# Patient Record
Sex: Female | Born: 1940 | Race: White | Hispanic: No | Smoking: Never smoker
Health system: Southern US, Community
[De-identification: ages and names within clinical notes are randomized; demographics above are authoritative.]

## PROBLEM LIST (undated history)

## (undated) DIAGNOSIS — R7303 Prediabetes: Secondary | ICD-10-CM

## (undated) DIAGNOSIS — E7800 Pure hypercholesterolemia, unspecified: Secondary | ICD-10-CM

## (undated) DIAGNOSIS — T884XXA Failed or difficult intubation, initial encounter: Secondary | ICD-10-CM

## (undated) DIAGNOSIS — I10 Essential (primary) hypertension: Secondary | ICD-10-CM

## (undated) DIAGNOSIS — T4145XA Adverse effect of unspecified anesthetic, initial encounter: Secondary | ICD-10-CM

## (undated) DIAGNOSIS — K5792 Diverticulitis of intestine, part unspecified, without perforation or abscess without bleeding: Secondary | ICD-10-CM

## (undated) DIAGNOSIS — G3184 Mild cognitive impairment, so stated: Secondary | ICD-10-CM

## (undated) DIAGNOSIS — G629 Polyneuropathy, unspecified: Secondary | ICD-10-CM

## (undated) DIAGNOSIS — K219 Gastro-esophageal reflux disease without esophagitis: Secondary | ICD-10-CM

## (undated) DIAGNOSIS — M1990 Unspecified osteoarthritis, unspecified site: Secondary | ICD-10-CM

## (undated) DIAGNOSIS — G2581 Restless legs syndrome: Secondary | ICD-10-CM

## (undated) DIAGNOSIS — T8859XA Other complications of anesthesia, initial encounter: Secondary | ICD-10-CM

## (undated) HISTORY — PX: OTHER SURGICAL HISTORY: SHX169

## (undated) HISTORY — DX: Pure hypercholesterolemia, unspecified: E78.00

## (undated) HISTORY — PX: BACK SURGERY: SHX140

## (undated) HISTORY — PX: TONSILLECTOMY: SUR1361

## (undated) HISTORY — PX: EYE SURGERY: SHX253

## (undated) HISTORY — PX: BREAST BIOPSY: SHX20

## (undated) HISTORY — PX: ABDOMINAL HYSTERECTOMY: SHX81

---
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
## 2002-08-29 HISTORY — PX: BACK SURGERY: SHX140

## 2013-09-30 DIAGNOSIS — Z1231 Encounter for screening mammogram for malignant neoplasm of breast: Secondary | ICD-10-CM | POA: Diagnosis not present

## 2013-11-11 DIAGNOSIS — G2581 Restless legs syndrome: Secondary | ICD-10-CM | POA: Diagnosis not present

## 2013-11-11 DIAGNOSIS — G589 Mononeuropathy, unspecified: Secondary | ICD-10-CM | POA: Diagnosis not present

## 2013-11-11 DIAGNOSIS — Z23 Encounter for immunization: Secondary | ICD-10-CM | POA: Diagnosis not present

## 2013-11-11 DIAGNOSIS — G603 Idiopathic progressive neuropathy: Secondary | ICD-10-CM | POA: Diagnosis not present

## 2013-11-11 DIAGNOSIS — R413 Other amnesia: Secondary | ICD-10-CM | POA: Diagnosis not present

## 2013-11-22 DIAGNOSIS — H01009 Unspecified blepharitis unspecified eye, unspecified eyelid: Secondary | ICD-10-CM | POA: Diagnosis not present

## 2013-11-22 DIAGNOSIS — H04129 Dry eye syndrome of unspecified lacrimal gland: Secondary | ICD-10-CM | POA: Diagnosis not present

## 2013-11-22 DIAGNOSIS — H2510 Age-related nuclear cataract, unspecified eye: Secondary | ICD-10-CM | POA: Diagnosis not present

## 2013-11-22 DIAGNOSIS — H2640 Unspecified secondary cataract: Secondary | ICD-10-CM | POA: Diagnosis not present

## 2013-12-04 DIAGNOSIS — N390 Urinary tract infection, site not specified: Secondary | ICD-10-CM | POA: Diagnosis not present

## 2013-12-11 DIAGNOSIS — H26499 Other secondary cataract, unspecified eye: Secondary | ICD-10-CM | POA: Diagnosis not present

## 2013-12-11 DIAGNOSIS — H2640 Unspecified secondary cataract: Secondary | ICD-10-CM | POA: Diagnosis not present

## 2013-12-18 DIAGNOSIS — H2510 Age-related nuclear cataract, unspecified eye: Secondary | ICD-10-CM | POA: Diagnosis not present

## 2013-12-18 DIAGNOSIS — H26019 Infantile and juvenile cortical, lamellar, or zonular cataract, unspecified eye: Secondary | ICD-10-CM | POA: Diagnosis not present

## 2013-12-18 DIAGNOSIS — H25019 Cortical age-related cataract, unspecified eye: Secondary | ICD-10-CM | POA: Diagnosis not present

## 2013-12-18 DIAGNOSIS — H2589 Other age-related cataract: Secondary | ICD-10-CM | POA: Diagnosis not present

## 2014-03-19 DIAGNOSIS — G603 Idiopathic progressive neuropathy: Secondary | ICD-10-CM | POA: Diagnosis not present

## 2014-03-19 DIAGNOSIS — R413 Other amnesia: Secondary | ICD-10-CM | POA: Diagnosis not present

## 2014-03-19 DIAGNOSIS — G589 Mononeuropathy, unspecified: Secondary | ICD-10-CM | POA: Diagnosis not present

## 2014-03-19 DIAGNOSIS — G2581 Restless legs syndrome: Secondary | ICD-10-CM | POA: Diagnosis not present

## 2014-10-02 DIAGNOSIS — Z1231 Encounter for screening mammogram for malignant neoplasm of breast: Secondary | ICD-10-CM | POA: Diagnosis not present

## 2014-10-27 DIAGNOSIS — G603 Idiopathic progressive neuropathy: Secondary | ICD-10-CM | POA: Diagnosis not present

## 2014-10-27 DIAGNOSIS — G2581 Restless legs syndrome: Secondary | ICD-10-CM | POA: Diagnosis not present

## 2014-10-27 DIAGNOSIS — E782 Mixed hyperlipidemia: Secondary | ICD-10-CM | POA: Diagnosis not present

## 2014-10-27 DIAGNOSIS — Z23 Encounter for immunization: Secondary | ICD-10-CM | POA: Diagnosis not present

## 2014-10-27 DIAGNOSIS — R413 Other amnesia: Secondary | ICD-10-CM | POA: Diagnosis not present

## 2014-10-27 DIAGNOSIS — G4700 Insomnia, unspecified: Secondary | ICD-10-CM | POA: Diagnosis not present

## 2014-11-19 DIAGNOSIS — M79605 Pain in left leg: Secondary | ICD-10-CM | POA: Diagnosis not present

## 2014-11-19 DIAGNOSIS — M25562 Pain in left knee: Secondary | ICD-10-CM | POA: Diagnosis not present

## 2014-11-19 DIAGNOSIS — M25569 Pain in unspecified knee: Secondary | ICD-10-CM | POA: Diagnosis not present

## 2014-12-15 DIAGNOSIS — M9904 Segmental and somatic dysfunction of sacral region: Secondary | ICD-10-CM | POA: Diagnosis not present

## 2014-12-15 DIAGNOSIS — M9903 Segmental and somatic dysfunction of lumbar region: Secondary | ICD-10-CM | POA: Diagnosis not present

## 2014-12-15 DIAGNOSIS — M5136 Other intervertebral disc degeneration, lumbar region: Secondary | ICD-10-CM | POA: Diagnosis not present

## 2014-12-15 DIAGNOSIS — M9902 Segmental and somatic dysfunction of thoracic region: Secondary | ICD-10-CM | POA: Diagnosis not present

## 2014-12-15 DIAGNOSIS — M9901 Segmental and somatic dysfunction of cervical region: Secondary | ICD-10-CM | POA: Diagnosis not present

## 2014-12-16 DIAGNOSIS — M9904 Segmental and somatic dysfunction of sacral region: Secondary | ICD-10-CM | POA: Diagnosis not present

## 2014-12-16 DIAGNOSIS — M5136 Other intervertebral disc degeneration, lumbar region: Secondary | ICD-10-CM | POA: Diagnosis not present

## 2014-12-16 DIAGNOSIS — M9903 Segmental and somatic dysfunction of lumbar region: Secondary | ICD-10-CM | POA: Diagnosis not present

## 2014-12-16 DIAGNOSIS — M9902 Segmental and somatic dysfunction of thoracic region: Secondary | ICD-10-CM | POA: Diagnosis not present

## 2014-12-16 DIAGNOSIS — M9901 Segmental and somatic dysfunction of cervical region: Secondary | ICD-10-CM | POA: Diagnosis not present

## 2014-12-26 DIAGNOSIS — M9903 Segmental and somatic dysfunction of lumbar region: Secondary | ICD-10-CM | POA: Diagnosis not present

## 2014-12-26 DIAGNOSIS — M5136 Other intervertebral disc degeneration, lumbar region: Secondary | ICD-10-CM | POA: Diagnosis not present

## 2014-12-26 DIAGNOSIS — M9902 Segmental and somatic dysfunction of thoracic region: Secondary | ICD-10-CM | POA: Diagnosis not present

## 2014-12-26 DIAGNOSIS — M9901 Segmental and somatic dysfunction of cervical region: Secondary | ICD-10-CM | POA: Diagnosis not present

## 2014-12-26 DIAGNOSIS — M9904 Segmental and somatic dysfunction of sacral region: Secondary | ICD-10-CM | POA: Diagnosis not present

## 2014-12-31 DIAGNOSIS — M9902 Segmental and somatic dysfunction of thoracic region: Secondary | ICD-10-CM | POA: Diagnosis not present

## 2014-12-31 DIAGNOSIS — M9904 Segmental and somatic dysfunction of sacral region: Secondary | ICD-10-CM | POA: Diagnosis not present

## 2014-12-31 DIAGNOSIS — M9901 Segmental and somatic dysfunction of cervical region: Secondary | ICD-10-CM | POA: Diagnosis not present

## 2014-12-31 DIAGNOSIS — M5136 Other intervertebral disc degeneration, lumbar region: Secondary | ICD-10-CM | POA: Diagnosis not present

## 2014-12-31 DIAGNOSIS — M9903 Segmental and somatic dysfunction of lumbar region: Secondary | ICD-10-CM | POA: Diagnosis not present

## 2015-01-02 DIAGNOSIS — M9904 Segmental and somatic dysfunction of sacral region: Secondary | ICD-10-CM | POA: Diagnosis not present

## 2015-01-02 DIAGNOSIS — M9902 Segmental and somatic dysfunction of thoracic region: Secondary | ICD-10-CM | POA: Diagnosis not present

## 2015-01-02 DIAGNOSIS — M9903 Segmental and somatic dysfunction of lumbar region: Secondary | ICD-10-CM | POA: Diagnosis not present

## 2015-01-02 DIAGNOSIS — M5136 Other intervertebral disc degeneration, lumbar region: Secondary | ICD-10-CM | POA: Diagnosis not present

## 2015-01-02 DIAGNOSIS — M9901 Segmental and somatic dysfunction of cervical region: Secondary | ICD-10-CM | POA: Diagnosis not present

## 2015-01-08 DIAGNOSIS — M545 Low back pain: Secondary | ICD-10-CM | POA: Diagnosis not present

## 2015-01-08 DIAGNOSIS — M25551 Pain in right hip: Secondary | ICD-10-CM | POA: Diagnosis not present

## 2015-01-14 DIAGNOSIS — G629 Polyneuropathy, unspecified: Secondary | ICD-10-CM | POA: Diagnosis not present

## 2015-01-14 DIAGNOSIS — G2581 Restless legs syndrome: Secondary | ICD-10-CM | POA: Diagnosis not present

## 2015-01-14 DIAGNOSIS — F411 Generalized anxiety disorder: Secondary | ICD-10-CM | POA: Diagnosis not present

## 2015-01-14 DIAGNOSIS — G4700 Insomnia, unspecified: Secondary | ICD-10-CM | POA: Diagnosis not present

## 2015-01-14 DIAGNOSIS — E785 Hyperlipidemia, unspecified: Secondary | ICD-10-CM | POA: Diagnosis not present

## 2015-01-16 DIAGNOSIS — M25551 Pain in right hip: Secondary | ICD-10-CM | POA: Diagnosis not present

## 2015-03-18 DIAGNOSIS — Z961 Presence of intraocular lens: Secondary | ICD-10-CM | POA: Diagnosis not present

## 2015-03-18 DIAGNOSIS — H02834 Dermatochalasis of left upper eyelid: Secondary | ICD-10-CM | POA: Diagnosis not present

## 2015-03-18 DIAGNOSIS — H04123 Dry eye syndrome of bilateral lacrimal glands: Secondary | ICD-10-CM | POA: Diagnosis not present

## 2015-03-18 DIAGNOSIS — H02831 Dermatochalasis of right upper eyelid: Secondary | ICD-10-CM | POA: Diagnosis not present

## 2015-04-06 DIAGNOSIS — H04121 Dry eye syndrome of right lacrimal gland: Secondary | ICD-10-CM | POA: Diagnosis not present

## 2015-04-20 DIAGNOSIS — G4700 Insomnia, unspecified: Secondary | ICD-10-CM | POA: Diagnosis not present

## 2015-04-20 DIAGNOSIS — G629 Polyneuropathy, unspecified: Secondary | ICD-10-CM | POA: Diagnosis not present

## 2015-04-20 DIAGNOSIS — Z1389 Encounter for screening for other disorder: Secondary | ICD-10-CM | POA: Diagnosis not present

## 2015-04-20 DIAGNOSIS — G2581 Restless legs syndrome: Secondary | ICD-10-CM | POA: Diagnosis not present

## 2015-04-20 DIAGNOSIS — F411 Generalized anxiety disorder: Secondary | ICD-10-CM | POA: Diagnosis not present

## 2015-04-20 DIAGNOSIS — E785 Hyperlipidemia, unspecified: Secondary | ICD-10-CM | POA: Diagnosis not present

## 2015-05-13 DIAGNOSIS — H04123 Dry eye syndrome of bilateral lacrimal glands: Secondary | ICD-10-CM | POA: Diagnosis not present

## 2015-05-27 DIAGNOSIS — M859 Disorder of bone density and structure, unspecified: Secondary | ICD-10-CM | POA: Diagnosis not present

## 2015-06-24 DIAGNOSIS — L908 Other atrophic disorders of skin: Secondary | ICD-10-CM | POA: Diagnosis not present

## 2015-06-24 DIAGNOSIS — H02409 Unspecified ptosis of unspecified eyelid: Secondary | ICD-10-CM | POA: Diagnosis not present

## 2015-06-24 DIAGNOSIS — H02831 Dermatochalasis of right upper eyelid: Secondary | ICD-10-CM | POA: Diagnosis not present

## 2015-06-24 DIAGNOSIS — H02834 Dermatochalasis of left upper eyelid: Secondary | ICD-10-CM | POA: Diagnosis not present

## 2015-06-26 DIAGNOSIS — M25551 Pain in right hip: Secondary | ICD-10-CM | POA: Diagnosis not present

## 2015-06-30 DIAGNOSIS — M545 Low back pain: Secondary | ICD-10-CM | POA: Diagnosis not present

## 2015-06-30 DIAGNOSIS — M25551 Pain in right hip: Secondary | ICD-10-CM | POA: Diagnosis not present

## 2015-07-02 ENCOUNTER — Other Ambulatory Visit: Payer: Self-pay | Admitting: Orthopedic Surgery

## 2015-07-02 DIAGNOSIS — M545 Low back pain, unspecified: Secondary | ICD-10-CM

## 2015-07-17 DIAGNOSIS — M8588 Other specified disorders of bone density and structure, other site: Secondary | ICD-10-CM | POA: Diagnosis not present

## 2015-07-27 DIAGNOSIS — H02834 Dermatochalasis of left upper eyelid: Secondary | ICD-10-CM | POA: Diagnosis not present

## 2015-07-27 DIAGNOSIS — H04123 Dry eye syndrome of bilateral lacrimal glands: Secondary | ICD-10-CM | POA: Diagnosis not present

## 2015-07-27 DIAGNOSIS — H02831 Dermatochalasis of right upper eyelid: Secondary | ICD-10-CM | POA: Diagnosis not present

## 2015-07-27 DIAGNOSIS — Z961 Presence of intraocular lens: Secondary | ICD-10-CM | POA: Diagnosis not present

## 2015-07-30 ENCOUNTER — Other Ambulatory Visit: Payer: Self-pay

## 2015-08-03 DIAGNOSIS — G2581 Restless legs syndrome: Secondary | ICD-10-CM | POA: Diagnosis not present

## 2015-08-03 DIAGNOSIS — G4700 Insomnia, unspecified: Secondary | ICD-10-CM | POA: Diagnosis not present

## 2015-08-03 DIAGNOSIS — M810 Age-related osteoporosis without current pathological fracture: Secondary | ICD-10-CM | POA: Diagnosis not present

## 2015-08-03 DIAGNOSIS — G629 Polyneuropathy, unspecified: Secondary | ICD-10-CM | POA: Diagnosis not present

## 2015-08-03 DIAGNOSIS — F411 Generalized anxiety disorder: Secondary | ICD-10-CM | POA: Diagnosis not present

## 2015-08-03 DIAGNOSIS — E785 Hyperlipidemia, unspecified: Secondary | ICD-10-CM | POA: Diagnosis not present

## 2015-08-10 DIAGNOSIS — Z79899 Other long term (current) drug therapy: Secondary | ICD-10-CM | POA: Diagnosis not present

## 2015-08-14 DIAGNOSIS — M25551 Pain in right hip: Secondary | ICD-10-CM | POA: Diagnosis not present

## 2015-08-14 DIAGNOSIS — S3282XA Multiple fractures of pelvis without disruption of pelvic ring, initial encounter for closed fracture: Secondary | ICD-10-CM | POA: Diagnosis not present

## 2015-09-04 ENCOUNTER — Encounter (HOSPITAL_COMMUNITY): Payer: Self-pay

## 2015-09-04 ENCOUNTER — Ambulatory Visit (HOSPITAL_COMMUNITY)
Admission: RE | Admit: 2015-09-04 | Discharge: 2015-09-04 | Disposition: A | Discharge status: Home / Self Care | Payer: Medicare Other | Source: Ambulatory Visit | Attending: Family Medicine | Admitting: Family Medicine

## 2015-09-04 ENCOUNTER — Other Ambulatory Visit (HOSPITAL_COMMUNITY): Payer: Self-pay | Admitting: Family Medicine

## 2015-09-04 DIAGNOSIS — M810 Age-related osteoporosis without current pathological fracture: Secondary | ICD-10-CM | POA: Diagnosis not present

## 2015-09-04 HISTORY — DX: Polyneuropathy, unspecified: G62.9

## 2015-09-04 HISTORY — DX: Restless legs syndrome: G25.81

## 2015-09-04 MED ORDER — ZOLEDRONIC ACID 5 MG/100ML IV SOLN
5.0000 mg | Freq: Once | INTRAVENOUS | Status: AC
  Administered 2015-09-04: 5 mg via INTRAVENOUS
  Filled 2015-09-04: qty 100

## 2015-09-04 MED ORDER — SODIUM CHLORIDE 0.9 % IV SOLN
Freq: Once | INTRAVENOUS | Status: AC
  Administered 2015-09-04: 14:00:00 via INTRAVENOUS

## 2015-09-04 NOTE — Discharge Instructions (Signed)

## 2015-09-17 DIAGNOSIS — E785 Hyperlipidemia, unspecified: Secondary | ICD-10-CM | POA: Diagnosis not present

## 2015-09-17 DIAGNOSIS — G629 Polyneuropathy, unspecified: Secondary | ICD-10-CM | POA: Diagnosis not present

## 2015-09-17 DIAGNOSIS — H02831 Dermatochalasis of right upper eyelid: Secondary | ICD-10-CM | POA: Diagnosis not present

## 2015-09-17 DIAGNOSIS — H02403 Unspecified ptosis of bilateral eyelids: Secondary | ICD-10-CM | POA: Diagnosis not present

## 2015-09-17 DIAGNOSIS — E7800 Pure hypercholesterolemia, unspecified: Secondary | ICD-10-CM | POA: Diagnosis not present

## 2015-09-17 DIAGNOSIS — F419 Anxiety disorder, unspecified: Secondary | ICD-10-CM | POA: Diagnosis not present

## 2015-09-17 DIAGNOSIS — H02834 Dermatochalasis of left upper eyelid: Secondary | ICD-10-CM | POA: Diagnosis not present

## 2015-09-29 DIAGNOSIS — Z880 Allergy status to penicillin: Secondary | ICD-10-CM | POA: Diagnosis not present

## 2015-09-29 DIAGNOSIS — Z48810 Encounter for surgical aftercare following surgery on the sense organs: Secondary | ICD-10-CM | POA: Diagnosis not present

## 2015-09-29 DIAGNOSIS — Z881 Allergy status to other antibiotic agents status: Secondary | ICD-10-CM | POA: Diagnosis not present

## 2015-09-29 DIAGNOSIS — Z882 Allergy status to sulfonamides status: Secondary | ICD-10-CM | POA: Diagnosis not present

## 2015-09-29 DIAGNOSIS — H02834 Dermatochalasis of left upper eyelid: Secondary | ICD-10-CM | POA: Diagnosis not present

## 2015-09-29 DIAGNOSIS — H02831 Dermatochalasis of right upper eyelid: Secondary | ICD-10-CM | POA: Diagnosis not present

## 2015-09-29 DIAGNOSIS — Z885 Allergy status to narcotic agent status: Secondary | ICD-10-CM | POA: Diagnosis not present

## 2015-09-30 ENCOUNTER — Other Ambulatory Visit: Payer: Self-pay

## 2015-09-30 DIAGNOSIS — Z1231 Encounter for screening mammogram for malignant neoplasm of breast: Secondary | ICD-10-CM

## 2015-10-19 ENCOUNTER — Ambulatory Visit
Admission: RE | Admit: 2015-10-19 | Discharge: 2015-10-19 | Disposition: A | Discharge status: Home / Self Care | Payer: Medicare Other | Source: Ambulatory Visit

## 2015-10-19 DIAGNOSIS — Z1231 Encounter for screening mammogram for malignant neoplasm of breast: Secondary | ICD-10-CM

## 2015-11-25 DIAGNOSIS — Z961 Presence of intraocular lens: Secondary | ICD-10-CM | POA: Diagnosis not present

## 2015-11-25 DIAGNOSIS — H04123 Dry eye syndrome of bilateral lacrimal glands: Secondary | ICD-10-CM | POA: Diagnosis not present

## 2015-11-30 DIAGNOSIS — E785 Hyperlipidemia, unspecified: Secondary | ICD-10-CM | POA: Diagnosis not present

## 2015-11-30 DIAGNOSIS — G629 Polyneuropathy, unspecified: Secondary | ICD-10-CM | POA: Diagnosis not present

## 2015-11-30 DIAGNOSIS — G2581 Restless legs syndrome: Secondary | ICD-10-CM | POA: Diagnosis not present

## 2015-11-30 DIAGNOSIS — F411 Generalized anxiety disorder: Secondary | ICD-10-CM | POA: Diagnosis not present

## 2015-11-30 DIAGNOSIS — M810 Age-related osteoporosis without current pathological fracture: Secondary | ICD-10-CM | POA: Diagnosis not present

## 2015-11-30 DIAGNOSIS — R635 Abnormal weight gain: Secondary | ICD-10-CM | POA: Diagnosis not present

## 2015-11-30 DIAGNOSIS — G4700 Insomnia, unspecified: Secondary | ICD-10-CM | POA: Diagnosis not present

## 2015-12-09 DIAGNOSIS — S2231XA Fracture of one rib, right side, initial encounter for closed fracture: Secondary | ICD-10-CM | POA: Diagnosis not present

## 2015-12-09 DIAGNOSIS — S20211A Contusion of right front wall of thorax, initial encounter: Secondary | ICD-10-CM | POA: Diagnosis not present

## 2015-12-30 DIAGNOSIS — T07 Unspecified multiple injuries: Secondary | ICD-10-CM | POA: Diagnosis not present

## 2015-12-30 DIAGNOSIS — L259 Unspecified contact dermatitis, unspecified cause: Secondary | ICD-10-CM | POA: Diagnosis not present

## 2016-02-18 DIAGNOSIS — G629 Polyneuropathy, unspecified: Secondary | ICD-10-CM | POA: Diagnosis not present

## 2016-02-18 DIAGNOSIS — L259 Unspecified contact dermatitis, unspecified cause: Secondary | ICD-10-CM | POA: Diagnosis not present

## 2016-02-21 DIAGNOSIS — L309 Dermatitis, unspecified: Secondary | ICD-10-CM | POA: Diagnosis not present

## 2016-04-02 DIAGNOSIS — M79672 Pain in left foot: Secondary | ICD-10-CM | POA: Diagnosis not present

## 2016-04-02 DIAGNOSIS — W548XXA Other contact with dog, initial encounter: Secondary | ICD-10-CM | POA: Diagnosis not present

## 2016-04-02 DIAGNOSIS — S51801A Unspecified open wound of right forearm, initial encounter: Secondary | ICD-10-CM | POA: Diagnosis not present

## 2016-04-13 DIAGNOSIS — M79671 Pain in right foot: Secondary | ICD-10-CM | POA: Diagnosis not present

## 2016-04-26 DIAGNOSIS — S92322D Displaced fracture of second metatarsal bone, left foot, subsequent encounter for fracture with routine healing: Secondary | ICD-10-CM | POA: Diagnosis not present

## 2016-05-07 DIAGNOSIS — L259 Unspecified contact dermatitis, unspecified cause: Secondary | ICD-10-CM | POA: Diagnosis not present

## 2016-05-15 DIAGNOSIS — L509 Urticaria, unspecified: Secondary | ICD-10-CM | POA: Diagnosis not present

## 2016-05-19 DIAGNOSIS — L308 Other specified dermatitis: Secondary | ICD-10-CM | POA: Diagnosis not present

## 2016-05-31 DIAGNOSIS — M79672 Pain in left foot: Secondary | ICD-10-CM | POA: Diagnosis not present

## 2016-05-31 DIAGNOSIS — G629 Polyneuropathy, unspecified: Secondary | ICD-10-CM | POA: Diagnosis not present

## 2016-05-31 DIAGNOSIS — R635 Abnormal weight gain: Secondary | ICD-10-CM | POA: Diagnosis not present

## 2016-05-31 DIAGNOSIS — Z79899 Other long term (current) drug therapy: Secondary | ICD-10-CM | POA: Diagnosis not present

## 2016-05-31 DIAGNOSIS — F411 Generalized anxiety disorder: Secondary | ICD-10-CM | POA: Diagnosis not present

## 2016-05-31 DIAGNOSIS — G2581 Restless legs syndrome: Secondary | ICD-10-CM | POA: Diagnosis not present

## 2016-05-31 DIAGNOSIS — E785 Hyperlipidemia, unspecified: Secondary | ICD-10-CM | POA: Diagnosis not present

## 2016-05-31 DIAGNOSIS — G4700 Insomnia, unspecified: Secondary | ICD-10-CM | POA: Diagnosis not present

## 2016-06-02 DIAGNOSIS — R600 Localized edema: Secondary | ICD-10-CM | POA: Diagnosis not present

## 2016-06-02 DIAGNOSIS — S92322S Displaced fracture of second metatarsal bone, left foot, sequela: Secondary | ICD-10-CM | POA: Diagnosis not present

## 2016-06-02 DIAGNOSIS — M79672 Pain in left foot: Secondary | ICD-10-CM | POA: Diagnosis not present

## 2016-06-03 DIAGNOSIS — F411 Generalized anxiety disorder: Secondary | ICD-10-CM | POA: Diagnosis not present

## 2016-06-03 DIAGNOSIS — Z79899 Other long term (current) drug therapy: Secondary | ICD-10-CM | POA: Diagnosis not present

## 2016-06-03 DIAGNOSIS — E785 Hyperlipidemia, unspecified: Secondary | ICD-10-CM | POA: Diagnosis not present

## 2016-06-03 DIAGNOSIS — R635 Abnormal weight gain: Secondary | ICD-10-CM | POA: Diagnosis not present

## 2016-06-03 DIAGNOSIS — G629 Polyneuropathy, unspecified: Secondary | ICD-10-CM | POA: Diagnosis not present

## 2016-06-03 DIAGNOSIS — G4700 Insomnia, unspecified: Secondary | ICD-10-CM | POA: Diagnosis not present

## 2016-06-03 DIAGNOSIS — G2581 Restless legs syndrome: Secondary | ICD-10-CM | POA: Diagnosis not present

## 2016-06-03 DIAGNOSIS — M79672 Pain in left foot: Secondary | ICD-10-CM | POA: Diagnosis not present

## 2016-06-30 DIAGNOSIS — S92322G Displaced fracture of second metatarsal bone, left foot, subsequent encounter for fracture with delayed healing: Secondary | ICD-10-CM | POA: Diagnosis not present

## 2016-07-14 DIAGNOSIS — S92322G Displaced fracture of second metatarsal bone, left foot, subsequent encounter for fracture with delayed healing: Secondary | ICD-10-CM | POA: Diagnosis not present

## 2016-08-04 DIAGNOSIS — S92322G Displaced fracture of second metatarsal bone, left foot, subsequent encounter for fracture with delayed healing: Secondary | ICD-10-CM | POA: Diagnosis not present

## 2016-08-04 DIAGNOSIS — G629 Polyneuropathy, unspecified: Secondary | ICD-10-CM | POA: Diagnosis not present

## 2016-09-20 ENCOUNTER — Other Ambulatory Visit: Payer: Self-pay | Admitting: Family Medicine

## 2016-09-20 DIAGNOSIS — Z1231 Encounter for screening mammogram for malignant neoplasm of breast: Secondary | ICD-10-CM

## 2016-09-29 DIAGNOSIS — G629 Polyneuropathy, unspecified: Secondary | ICD-10-CM | POA: Diagnosis not present

## 2016-09-29 DIAGNOSIS — M810 Age-related osteoporosis without current pathological fracture: Secondary | ICD-10-CM | POA: Diagnosis not present

## 2016-09-29 DIAGNOSIS — G2581 Restless legs syndrome: Secondary | ICD-10-CM | POA: Diagnosis not present

## 2016-09-29 DIAGNOSIS — E7800 Pure hypercholesterolemia, unspecified: Secondary | ICD-10-CM | POA: Diagnosis not present

## 2016-10-06 DIAGNOSIS — M06841 Other specified rheumatoid arthritis, right hand: Secondary | ICD-10-CM | POA: Diagnosis not present

## 2016-10-06 DIAGNOSIS — M1990 Unspecified osteoarthritis, unspecified site: Secondary | ICD-10-CM | POA: Diagnosis not present

## 2016-10-06 DIAGNOSIS — M06842 Other specified rheumatoid arthritis, left hand: Secondary | ICD-10-CM | POA: Diagnosis not present

## 2016-10-06 DIAGNOSIS — M79643 Pain in unspecified hand: Secondary | ICD-10-CM | POA: Diagnosis not present

## 2016-10-06 DIAGNOSIS — M25511 Pain in right shoulder: Secondary | ICD-10-CM | POA: Diagnosis not present

## 2016-10-06 DIAGNOSIS — M2550 Pain in unspecified joint: Secondary | ICD-10-CM | POA: Diagnosis not present

## 2016-10-19 ENCOUNTER — Other Ambulatory Visit: Payer: Self-pay | Admitting: Internal Medicine

## 2016-10-19 ENCOUNTER — Ambulatory Visit
Admission: RE | Admit: 2016-10-19 | Discharge: 2016-10-19 | Disposition: A | Discharge status: Home / Self Care | Payer: Medicare Other | Source: Ambulatory Visit | Attending: Family Medicine | Admitting: Family Medicine

## 2016-10-19 DIAGNOSIS — Z1231 Encounter for screening mammogram for malignant neoplasm of breast: Secondary | ICD-10-CM | POA: Diagnosis not present

## 2016-10-27 DIAGNOSIS — G629 Polyneuropathy, unspecified: Secondary | ICD-10-CM | POA: Diagnosis not present

## 2016-10-27 DIAGNOSIS — R350 Frequency of micturition: Secondary | ICD-10-CM | POA: Diagnosis not present

## 2016-10-27 DIAGNOSIS — R413 Other amnesia: Secondary | ICD-10-CM | POA: Diagnosis not present

## 2016-10-27 DIAGNOSIS — E559 Vitamin D deficiency, unspecified: Secondary | ICD-10-CM | POA: Diagnosis not present

## 2016-10-27 DIAGNOSIS — E7800 Pure hypercholesterolemia, unspecified: Secondary | ICD-10-CM | POA: Diagnosis not present

## 2016-11-01 DIAGNOSIS — M79672 Pain in left foot: Secondary | ICD-10-CM | POA: Diagnosis not present

## 2016-11-01 DIAGNOSIS — E559 Vitamin D deficiency, unspecified: Secondary | ICD-10-CM | POA: Diagnosis not present

## 2016-11-01 DIAGNOSIS — M810 Age-related osteoporosis without current pathological fracture: Secondary | ICD-10-CM | POA: Diagnosis not present

## 2016-11-01 DIAGNOSIS — E7800 Pure hypercholesterolemia, unspecified: Secondary | ICD-10-CM | POA: Diagnosis not present

## 2016-11-03 DIAGNOSIS — L03031 Cellulitis of right toe: Secondary | ICD-10-CM | POA: Diagnosis not present

## 2016-11-03 DIAGNOSIS — Z09 Encounter for follow-up examination after completed treatment for conditions other than malignant neoplasm: Secondary | ICD-10-CM | POA: Diagnosis not present

## 2016-11-03 DIAGNOSIS — L02611 Cutaneous abscess of right foot: Secondary | ICD-10-CM | POA: Diagnosis not present

## 2016-11-03 DIAGNOSIS — M79672 Pain in left foot: Secondary | ICD-10-CM | POA: Diagnosis not present

## 2016-11-03 DIAGNOSIS — M79674 Pain in right toe(s): Secondary | ICD-10-CM | POA: Diagnosis not present

## 2016-11-30 DIAGNOSIS — H04123 Dry eye syndrome of bilateral lacrimal glands: Secondary | ICD-10-CM | POA: Diagnosis not present

## 2016-11-30 DIAGNOSIS — Z9889 Other specified postprocedural states: Secondary | ICD-10-CM | POA: Diagnosis not present

## 2016-11-30 DIAGNOSIS — Z961 Presence of intraocular lens: Secondary | ICD-10-CM | POA: Diagnosis not present

## 2017-01-04 DIAGNOSIS — M810 Age-related osteoporosis without current pathological fracture: Secondary | ICD-10-CM | POA: Diagnosis not present

## 2017-01-05 DIAGNOSIS — Z780 Asymptomatic menopausal state: Secondary | ICD-10-CM | POA: Diagnosis not present

## 2017-01-05 DIAGNOSIS — G8929 Other chronic pain: Secondary | ICD-10-CM | POA: Diagnosis not present

## 2017-01-05 DIAGNOSIS — Z0000 Encounter for general adult medical examination without abnormal findings: Secondary | ICD-10-CM | POA: Diagnosis not present

## 2017-01-05 DIAGNOSIS — M25512 Pain in left shoulder: Secondary | ICD-10-CM | POA: Diagnosis not present

## 2017-01-09 DIAGNOSIS — M79643 Pain in unspecified hand: Secondary | ICD-10-CM | POA: Diagnosis not present

## 2017-01-09 DIAGNOSIS — M25512 Pain in left shoulder: Secondary | ICD-10-CM | POA: Diagnosis not present

## 2017-01-09 DIAGNOSIS — M1990 Unspecified osteoarthritis, unspecified site: Secondary | ICD-10-CM | POA: Diagnosis not present

## 2017-01-27 DIAGNOSIS — L239 Allergic contact dermatitis, unspecified cause: Secondary | ICD-10-CM | POA: Diagnosis not present

## 2017-02-02 DIAGNOSIS — R21 Rash and other nonspecific skin eruption: Secondary | ICD-10-CM | POA: Diagnosis not present

## 2017-02-02 DIAGNOSIS — L299 Pruritus, unspecified: Secondary | ICD-10-CM | POA: Diagnosis not present

## 2017-02-06 DIAGNOSIS — M25511 Pain in right shoulder: Secondary | ICD-10-CM | POA: Diagnosis not present

## 2017-02-06 DIAGNOSIS — R9431 Abnormal electrocardiogram [ECG] [EKG]: Secondary | ICD-10-CM | POA: Diagnosis not present

## 2017-02-06 DIAGNOSIS — E7800 Pure hypercholesterolemia, unspecified: Secondary | ICD-10-CM | POA: Diagnosis not present

## 2017-02-06 DIAGNOSIS — M25512 Pain in left shoulder: Secondary | ICD-10-CM | POA: Diagnosis not present

## 2017-02-10 DIAGNOSIS — M25519 Pain in unspecified shoulder: Secondary | ICD-10-CM | POA: Diagnosis not present

## 2017-02-10 DIAGNOSIS — R9431 Abnormal electrocardiogram [ECG] [EKG]: Secondary | ICD-10-CM | POA: Diagnosis not present

## 2017-02-13 DIAGNOSIS — R0789 Other chest pain: Secondary | ICD-10-CM | POA: Diagnosis not present

## 2017-02-13 DIAGNOSIS — S299XXA Unspecified injury of thorax, initial encounter: Secondary | ICD-10-CM | POA: Diagnosis not present

## 2017-02-13 DIAGNOSIS — R0781 Pleurodynia: Secondary | ICD-10-CM | POA: Diagnosis not present

## 2017-03-24 DIAGNOSIS — N390 Urinary tract infection, site not specified: Secondary | ICD-10-CM | POA: Diagnosis not present

## 2017-04-26 DIAGNOSIS — M9901 Segmental and somatic dysfunction of cervical region: Secondary | ICD-10-CM | POA: Diagnosis not present

## 2017-04-26 DIAGNOSIS — M9904 Segmental and somatic dysfunction of sacral region: Secondary | ICD-10-CM | POA: Diagnosis not present

## 2017-04-26 DIAGNOSIS — M5030 Other cervical disc degeneration, unspecified cervical region: Secondary | ICD-10-CM | POA: Diagnosis not present

## 2017-04-26 DIAGNOSIS — M9902 Segmental and somatic dysfunction of thoracic region: Secondary | ICD-10-CM | POA: Diagnosis not present

## 2017-04-26 DIAGNOSIS — M9903 Segmental and somatic dysfunction of lumbar region: Secondary | ICD-10-CM | POA: Diagnosis not present

## 2017-04-26 DIAGNOSIS — M5136 Other intervertebral disc degeneration, lumbar region: Secondary | ICD-10-CM | POA: Diagnosis not present

## 2017-04-28 DIAGNOSIS — M9902 Segmental and somatic dysfunction of thoracic region: Secondary | ICD-10-CM | POA: Diagnosis not present

## 2017-04-28 DIAGNOSIS — M5136 Other intervertebral disc degeneration, lumbar region: Secondary | ICD-10-CM | POA: Diagnosis not present

## 2017-04-28 DIAGNOSIS — M5030 Other cervical disc degeneration, unspecified cervical region: Secondary | ICD-10-CM | POA: Diagnosis not present

## 2017-04-28 DIAGNOSIS — M9903 Segmental and somatic dysfunction of lumbar region: Secondary | ICD-10-CM | POA: Diagnosis not present

## 2017-04-28 DIAGNOSIS — M9901 Segmental and somatic dysfunction of cervical region: Secondary | ICD-10-CM | POA: Diagnosis not present

## 2017-04-28 DIAGNOSIS — M9904 Segmental and somatic dysfunction of sacral region: Secondary | ICD-10-CM | POA: Diagnosis not present

## 2017-05-04 DIAGNOSIS — E7800 Pure hypercholesterolemia, unspecified: Secondary | ICD-10-CM | POA: Diagnosis not present

## 2017-05-04 DIAGNOSIS — M810 Age-related osteoporosis without current pathological fracture: Secondary | ICD-10-CM | POA: Diagnosis not present

## 2017-05-05 DIAGNOSIS — M9904 Segmental and somatic dysfunction of sacral region: Secondary | ICD-10-CM | POA: Diagnosis not present

## 2017-05-05 DIAGNOSIS — M5136 Other intervertebral disc degeneration, lumbar region: Secondary | ICD-10-CM | POA: Diagnosis not present

## 2017-05-05 DIAGNOSIS — M9902 Segmental and somatic dysfunction of thoracic region: Secondary | ICD-10-CM | POA: Diagnosis not present

## 2017-05-05 DIAGNOSIS — M5030 Other cervical disc degeneration, unspecified cervical region: Secondary | ICD-10-CM | POA: Diagnosis not present

## 2017-05-05 DIAGNOSIS — M9903 Segmental and somatic dysfunction of lumbar region: Secondary | ICD-10-CM | POA: Diagnosis not present

## 2017-05-05 DIAGNOSIS — M9901 Segmental and somatic dysfunction of cervical region: Secondary | ICD-10-CM | POA: Diagnosis not present

## 2017-05-10 DIAGNOSIS — M9902 Segmental and somatic dysfunction of thoracic region: Secondary | ICD-10-CM | POA: Diagnosis not present

## 2017-05-10 DIAGNOSIS — M9903 Segmental and somatic dysfunction of lumbar region: Secondary | ICD-10-CM | POA: Diagnosis not present

## 2017-05-10 DIAGNOSIS — M9904 Segmental and somatic dysfunction of sacral region: Secondary | ICD-10-CM | POA: Diagnosis not present

## 2017-05-10 DIAGNOSIS — M5136 Other intervertebral disc degeneration, lumbar region: Secondary | ICD-10-CM | POA: Diagnosis not present

## 2017-05-10 DIAGNOSIS — M5030 Other cervical disc degeneration, unspecified cervical region: Secondary | ICD-10-CM | POA: Diagnosis not present

## 2017-05-10 DIAGNOSIS — M9901 Segmental and somatic dysfunction of cervical region: Secondary | ICD-10-CM | POA: Diagnosis not present

## 2017-05-11 DIAGNOSIS — Z780 Asymptomatic menopausal state: Secondary | ICD-10-CM | POA: Diagnosis not present

## 2017-05-11 DIAGNOSIS — M810 Age-related osteoporosis without current pathological fracture: Secondary | ICD-10-CM | POA: Diagnosis not present

## 2017-05-11 DIAGNOSIS — E7800 Pure hypercholesterolemia, unspecified: Secondary | ICD-10-CM | POA: Diagnosis not present

## 2017-05-11 DIAGNOSIS — G629 Polyneuropathy, unspecified: Secondary | ICD-10-CM | POA: Diagnosis not present

## 2017-05-11 DIAGNOSIS — G2581 Restless legs syndrome: Secondary | ICD-10-CM | POA: Diagnosis not present

## 2017-05-12 ENCOUNTER — Other Ambulatory Visit: Payer: Self-pay | Admitting: Internal Medicine

## 2017-05-12 DIAGNOSIS — M9902 Segmental and somatic dysfunction of thoracic region: Secondary | ICD-10-CM | POA: Diagnosis not present

## 2017-05-12 DIAGNOSIS — M5030 Other cervical disc degeneration, unspecified cervical region: Secondary | ICD-10-CM | POA: Diagnosis not present

## 2017-05-12 DIAGNOSIS — R945 Abnormal results of liver function studies: Secondary | ICD-10-CM

## 2017-05-12 DIAGNOSIS — M5136 Other intervertebral disc degeneration, lumbar region: Secondary | ICD-10-CM | POA: Diagnosis not present

## 2017-05-12 DIAGNOSIS — M9901 Segmental and somatic dysfunction of cervical region: Secondary | ICD-10-CM | POA: Diagnosis not present

## 2017-05-12 DIAGNOSIS — M9904 Segmental and somatic dysfunction of sacral region: Secondary | ICD-10-CM | POA: Diagnosis not present

## 2017-05-12 DIAGNOSIS — M9903 Segmental and somatic dysfunction of lumbar region: Secondary | ICD-10-CM | POA: Diagnosis not present

## 2017-05-15 DIAGNOSIS — L309 Dermatitis, unspecified: Secondary | ICD-10-CM | POA: Diagnosis not present

## 2017-05-15 DIAGNOSIS — D225 Melanocytic nevi of trunk: Secondary | ICD-10-CM | POA: Diagnosis not present

## 2017-05-15 DIAGNOSIS — L821 Other seborrheic keratosis: Secondary | ICD-10-CM | POA: Diagnosis not present

## 2017-05-15 DIAGNOSIS — L814 Other melanin hyperpigmentation: Secondary | ICD-10-CM | POA: Diagnosis not present

## 2017-05-19 ENCOUNTER — Ambulatory Visit
Admission: RE | Admit: 2017-05-19 | Discharge: 2017-05-19 | Disposition: A | Discharge status: Home / Self Care | Payer: Medicare Other | Source: Ambulatory Visit | Attending: Internal Medicine | Admitting: Internal Medicine

## 2017-05-19 DIAGNOSIS — R945 Abnormal results of liver function studies: Secondary | ICD-10-CM

## 2017-05-19 DIAGNOSIS — R7989 Other specified abnormal findings of blood chemistry: Secondary | ICD-10-CM | POA: Diagnosis not present

## 2017-06-01 DIAGNOSIS — K7689 Other specified diseases of liver: Secondary | ICD-10-CM | POA: Diagnosis not present

## 2017-06-08 DIAGNOSIS — E7800 Pure hypercholesterolemia, unspecified: Secondary | ICD-10-CM | POA: Diagnosis not present

## 2017-06-08 DIAGNOSIS — Z0000 Encounter for general adult medical examination without abnormal findings: Secondary | ICD-10-CM | POA: Diagnosis not present

## 2017-06-08 DIAGNOSIS — K7689 Other specified diseases of liver: Secondary | ICD-10-CM | POA: Diagnosis not present

## 2017-07-04 ENCOUNTER — Encounter (INDEPENDENT_AMBULATORY_CARE_PROVIDER_SITE_OTHER): Payer: Self-pay

## 2017-07-04 ENCOUNTER — Encounter: Payer: Self-pay | Admitting: Neurology

## 2017-07-04 ENCOUNTER — Ambulatory Visit (INDEPENDENT_AMBULATORY_CARE_PROVIDER_SITE_OTHER): Payer: Medicare Other | Admitting: Neurology

## 2017-07-04 VITALS — BP 155/85 | HR 93 | Ht 61.0 in | Wt 156.0 lb

## 2017-07-04 DIAGNOSIS — R202 Paresthesia of skin: Secondary | ICD-10-CM | POA: Insufficient documentation

## 2017-07-04 DIAGNOSIS — G2581 Restless legs syndrome: Secondary | ICD-10-CM | POA: Diagnosis not present

## 2017-07-04 DIAGNOSIS — R7989 Other specified abnormal findings of blood chemistry: Secondary | ICD-10-CM | POA: Diagnosis not present

## 2017-07-04 DIAGNOSIS — R799 Abnormal finding of blood chemistry, unspecified: Secondary | ICD-10-CM | POA: Diagnosis not present

## 2017-07-04 DIAGNOSIS — E559 Vitamin D deficiency, unspecified: Secondary | ICD-10-CM | POA: Diagnosis not present

## 2017-07-04 MED ORDER — CAPSAICIN 0.1 % EX CREA: TOPICAL_CREAM | CUTANEOUS | 11 refills | Status: DC

## 2017-07-04 MED ORDER — DICLOFENAC SODIUM 3 % TD GEL: TRANSDERMAL | 11 refills | Status: DC

## 2017-07-04 NOTE — Progress Notes (Signed)
PATIENT: Kimberly [name] DOB: 12/22/1940  Chief Complaint  Patient presents with  . Peripheral Neuropathy    Reports tingling and burning pain in her bilateral feet.  She is currently taking gabapentin 300mg  in am, 300mg  midday and 600mg  at bedtime.  She is also taking duloxetine 30mg  daily.  She is getting limited relief with her medications.  Marland Kitchen PCP    Jani Gravel, MD     HISTORICAL  Kimberly [name] is a 76 year old female, seen in refer by her primary care doctor  Jani Gravel a evaluation of peripheral neuropathy, initial evaluation was on July 04 2017.  I have reviewed and summarized the referring note, she has history of hyperlipidemia, anxiety, restless leg symptoms, lumbar decompression in June 2004, she had low back pain, radiating pain to left side before surgery, surgery really has helped her symptoms,  Since 2015, she noticed bilateral toes numbness burning tingly sensation, gradually getting worse, difficulty sleeping at nighttime, urge to move her leg, she was diagnosed with restless leg, over the years, she is on titrating dose of gabapentin, now taking 300/300/600 mg, along with Mirapex 0.25 milligrams every night, trazodone 50 mg every night for sleep,  She denied gait abnormality, denies significant low back pain, no bowel bladder incontinence.  REVIEW OF SYSTEMS: Full 14 system review of systems performed and notable only for as above  ALLERGIES: Allergies  Allergen Reactions  . Amoxicillin Swelling    Throat felt swollen  . Ciprofloxacin Itching  . Hydrocodone-Acetaminophen Itching  . Sulfa Antibiotics Rash    And itching    HOME MEDICATIONS: Current Outpatient Medications  Medication Sig Dispense Refill  . DULoxetine (CYMBALTA) 30 MG capsule Take 60 mg daily by mouth.    . ergocalciferol (VITAMIN D2) 50000 units capsule Take 50,000 Units by mouth once a week.    . gabapentin (NEURONTIN) 300 MG capsule Taking 300mg  in morning, 300mg  midday, 600mg  at bedtime.     . magnesium oxide (MAG-OX) 400 MG tablet Take 400 mg by mouth daily.    Marland Kitchen MEGARED OMEGA-3 KRILL OIL PO Take by mouth.    . naproxen (NAPROSYN) 250 MG tablet Take by mouth 2 (two) times daily with a meal.    . pramipexole (MIRAPEX) 0.25 MG tablet Take 0.25 mg by mouth daily.    . simvastatin (ZOCOR) 20 MG tablet Take 20 mg by mouth daily.    . traZODone (DESYREL) 50 MG tablet Take 50 mg by mouth at bedtime.    Marland Kitchen escitalopram (LEXAPRO) 10 MG tablet Take 10 mg by mouth daily.     No current facility-administered medications for this visit.     PAST MEDICAL HISTORY: Past Medical History:  Diagnosis Date  . Hypercholesteremia   . Peripheral neuropathy   . Restless leg syndrome     PAST SURGICAL HISTORY: Past Surgical History:  Procedure Laterality Date  . ABDOMINAL HYSTERECTOMY     Age 65  . BACK SURGERY    . EYE SURGERY     bilateral cateract   . TONSILLECTOMY      FAMILY HISTORY: Family History  Problem Relation Age of Onset  . Breast cancer Sister   . Other Mother        passed away at age 68  . Heart attack Father        passed away at age 70    SOCIAL HISTORY:  Social History   Socioeconomic History  . Marital status: Widowed    Spouse name: Not on  file  . Number of children: 1  . Years of education: 77  . Highest education level: High school graduate  Social Needs  . Financial resource strain: Not on file  . Food insecurity - worry: Not on file  . Food insecurity - inability: Not on file  . Transportation needs - medical: Not on file  . Transportation needs - non-medical: Not on file  Occupational History  . Occupation: Retired  Tobacco Use  . Smoking status: Never Smoker  . Smokeless tobacco: Current User    Types: Snuff  Substance and Sexual Activity  . Alcohol use: No  . Drug use: No  . Sexual activity: Not on file  Other Topics Concern  . Not on file  Social History Narrative   Lives at home alone.   Right-handed.   1 cup caffeine daily.       PHYSICAL EXAM   Vitals:   07/04/17 0930  BP: (!) 155/85  Pulse: 93  Weight: 156 lb (70.8 kg)  Height: 5\' 1"  (1.549 m)    Not recorded      Body mass index is 29.48 kg/m.  PHYSICAL EXAMNIATION:  Gen: NAD, conversant, well nourised, obese, well groomed                     Cardiovascular: Regular rate rhythm, no peripheral edema, warm, nontender. Eyes: Conjunctivae clear without exudates or hemorrhage Neck: Supple, no carotid bruits. Pulmonary: Clear to auscultation bilaterally   NEUROLOGICAL EXAM:  MENTAL STATUS: Speech:    Speech is normal; fluent and spontaneous with normal comprehension.  Cognition:     Orientation to time, place and person     Normal recent and remote memory     Normal Attention span and concentration     Normal Language, naming, repeating,spontaneous speech     Fund of knowledge   CRANIAL NERVES: CN II: Visual fields are full to confrontation. Fundoscopic exam is normal with sharp discs and no vascular changes. Pupils are round equal and briskly reactive to light. CN III, IV, VI: extraocular movement are normal. No ptosis. CN V: Facial sensation is intact to pinprick in all 3 divisions bilaterally. Corneal responses are intact.  CN VII: Face is symmetric with normal eye closure and smile. CN VIII: Hearing is normal to rubbing fingers CN IX, X: Palate elevates symmetrically. Phonation is normal. CN XI: Head turning and shoulder shrug are intact CN XII: Tongue is midline with normal movements and no atrophy.  MOTOR: There is no pronator drift of out-stretched arms. Muscle bulk and tone are normal. Muscle strength is normal.  REFLEXES: Reflexes are 2+ and symmetric at the biceps, triceps, knees, and ankles. Plantar responses are flexor.  SENSORY: Intact to light touch, pinprick, positional sensation and vibratory sensation are intact in fingers and toes.  COORDINATION: Rapid alternating movements and fine finger movements are intact.  There is no dysmetria on finger-to-nose and heel-knee-shin.    GAIT/STANCE: Posture is normal. Gait is steady with normal steps, base, arm swing, and turning. She has mild tiptoe, heel walking difficulties, Romberg is absent.   DIAGNOSTIC DATA (LABS, IMAGING, TESTING) - I reviewed patient records, labs, notes, testing and imaging myself where available.   ASSESSMENT AND PLAN  Kimberly [name] is a 76 y.o. female    Bilateral feet paresthesia  EMG/NCS for peripheral neuropathy   Restless leg syndrome  Proceed with laboratory evaluation for etiology  Continue gabapentin 300/300/600 mg, Mirapex 0.25 mg every night   Kimberly [name]  Krista Blue, M.D. Ph.D.  [facility] [address] [place], [place] [address] Ph: [phone] Fax: [phone]  CC: Jani Gravel, MD

## 2017-07-06 LAB — COMPREHENSIVE METABOLIC PANEL
ALK PHOS: 66 IU/L (ref 39–117)
ALT: 18 IU/L (ref 0–32)
AST: 28 IU/L (ref 0–40)
Albumin/Globulin Ratio: 1.9 (ref 1.2–2.2)
Albumin: 4.3 g/dL (ref 3.5–4.8)
BILIRUBIN TOTAL: 0.3 mg/dL (ref 0.0–1.2)
BUN / CREAT RATIO: 16 (ref 12–28)
BUN: 12 mg/dL (ref 8–27)
CHLORIDE: 101 mmol/L (ref 96–106)
CO2: 26 mmol/L (ref 20–29)
CREATININE: 0.75 mg/dL (ref 0.57–1.00)
Calcium: 9.2 mg/dL (ref 8.7–10.3)
GFR calc Af Amer: 89 mL/min/{1.73_m2} (ref >59)
GFR calc non Af Amer: 77 mL/min/{1.73_m2} (ref >59)
GLUCOSE: 95 mg/dL (ref 65–99)
Globulin, Total: 2.3 g/dL (ref 1.5–4.5)
Potassium: 4.7 mmol/L (ref 3.5–5.2)
SODIUM: 140 mmol/L (ref 134–144)

## 2017-07-06 LAB — CBC WITH DIFFERENTIAL/PLATELET
BASOS ABS: 0 10*3/uL (ref 0.0–0.2)
Basos: 1 %
EOS (ABSOLUTE): 0.2 10*3/uL (ref 0.0–0.4)
EOS: 4 %
HEMATOCRIT: 41.6 % (ref 34.0–46.6)
Hemoglobin: 13.3 g/dL (ref 11.1–15.9)
IMMATURE GRANULOCYTES: 0 %
Immature Grans (Abs): 0 10*3/uL (ref 0.0–0.1)
LYMPHS ABS: 1.3 10*3/uL (ref 0.7–3.1)
Lymphs: 26 %
MCH: 29.4 pg (ref 26.6–33.0)
MCHC: 32 g/dL (ref 31.5–35.7)
MCV: 92 fL (ref 79–97)
MONOCYTES: 8 %
MONOS ABS: 0.4 10*3/uL (ref 0.1–0.9)
NEUTROS PCT: 61 %
Neutrophils Absolute: 3.2 10*3/uL (ref 1.4–7.0)
Platelets: 267 10*3/uL (ref 150–379)
RBC: 4.52 x10E6/uL (ref 3.77–5.28)
RDW: 13.3 % (ref 12.3–15.4)
WBC: 5.2 10*3/uL (ref 3.4–10.8)

## 2017-07-06 LAB — IMMUNOFIXATION ELECTROPHORESIS
IGG (IMMUNOGLOBIN G), SERUM: 695 mg/dL — AB (ref 700–1600)
IgA/Immunoglobulin A, Serum: 273 mg/dL (ref 64–422)
IgM (Immunoglobulin M), Srm: 42 mg/dL (ref 26–217)
Total Protein: 6.6 g/dL (ref 6.0–8.5)

## 2017-07-06 LAB — CK: CK TOTAL: 67 U/L (ref 24–173)

## 2017-07-06 LAB — RPR: RPR: NONREACTIVE

## 2017-07-06 LAB — TSH: TSH: 1.03 u[IU]/mL (ref 0.450–4.500)

## 2017-07-06 LAB — SEDIMENTATION RATE: SED RATE: 4 mm/h (ref 0–40)

## 2017-07-06 LAB — VITAMIN D 25 HYDROXY (VIT D DEFICIENCY, FRACTURES): VIT D 25 HYDROXY: 66.9 ng/mL (ref 30.0–100.0)

## 2017-07-06 LAB — VITAMIN B12: Vitamin B-12: >2000 pg/mL — ABNORMAL HIGH (ref 232–1245)

## 2017-07-06 LAB — COPPER, SERUM: COPPER: 113 ug/dL (ref 72–166)

## 2017-07-06 LAB — HIV ANTIBODY (ROUTINE TESTING W REFLEX): HIV Screen 4th Generation wRfx: NONREACTIVE

## 2017-07-06 LAB — HGB A1C W/O EAG: Hgb A1c MFr Bld: 5.6 % (ref 4.8–5.6)

## 2017-07-06 LAB — ANA W/REFLEX: ANA: NEGATIVE

## 2017-07-06 LAB — C-REACTIVE PROTEIN: CRP: 1.1 mg/L (ref 0.0–4.9)

## 2017-07-07 ENCOUNTER — Telehealth: Payer: Self-pay | Admitting: *Deleted

## 2017-07-07 NOTE — Telephone Encounter (Signed)
-----   Message from Marcial Pacas, MD sent at 07/06/2017  4:39 PM EST ----- Please call patient: Laboratory evaluation showed no significant abnormality

## 2017-07-07 NOTE — Telephone Encounter (Signed)
Relayed via VM that her her labs showed not singificant abnormality.   Pt is to call back if questions.

## 2017-07-13 DIAGNOSIS — M810 Age-related osteoporosis without current pathological fracture: Secondary | ICD-10-CM | POA: Diagnosis not present

## 2017-07-28 ENCOUNTER — Ambulatory Visit (INDEPENDENT_AMBULATORY_CARE_PROVIDER_SITE_OTHER): Payer: Medicare Other | Admitting: Neurology

## 2017-07-28 DIAGNOSIS — G2581 Restless legs syndrome: Secondary | ICD-10-CM | POA: Diagnosis not present

## 2017-07-28 DIAGNOSIS — R202 Paresthesia of skin: Secondary | ICD-10-CM

## 2017-07-28 MED ORDER — OXCARBAZEPINE 150 MG PO TABS: 150.0000 mg | ORAL_TABLET | Freq: Two times a day (BID) | ORAL | 11 refills | Status: DC

## 2017-07-28 NOTE — Progress Notes (Signed)
PATIENT: Kimberly [name] DOB: 02-09-1941  HISTORICAL  Kimberly [name] is a 76 year old female, seen in refer by her primary care doctor  Jani Gravel a evaluation of peripheral neuropathy, initial evaluation was on July 04 2017.  I have reviewed and summarized the referring note, she has history of hyperlipidemia, anxiety, restless leg symptoms, lumbar decompression in June 2004, she had low back pain, radiating pain to left side before surgery, surgery really has helped her symptoms,  Since 2015, she noticed bilateral toes numbness burning tingly sensation, gradually getting worse, difficulty sleeping at nighttime, urge to move her leg, she was diagnosed with restless leg, over the years, she is on titrating dose of gabapentin, now taking 300/300/600 mg, along with Mirapex 0.25 milligrams every night, trazodone 50 mg every night for sleep,  She denied gait abnormality, denies significant low back pain, no bowel bladder incontinence.  UPDATE Jul 28 2017: Laboratory evaluation showed normal vitamin D, B12, ESR, RPR, ANA, immunofixation protein pheresis, CMP, A1c was 5.6,  She continued complaints of bilateral toes cold, burning, pain despite gabapentin 1200 mg daily, Cymbalta 30 mg twice a day, trazodone 50 mg every night, especially after bearing weight,  Electrodiagnostic study today is normal, there is no evidence of large fiber peripheral neuropathy  REVIEW OF SYSTEMS: Full 14 system review of systems performed and notable only for as above  ALLERGIES: Allergies  Allergen Reactions  . Amoxicillin Swelling    Throat felt swollen  . Ciprofloxacin Itching  . Hydrocodone-Acetaminophen Itching  . Sulfa Antibiotics Rash    And itching    HOME MEDICATIONS: Current Outpatient Medications  Medication Sig Dispense Refill  . Capsaicin 0.1 % CREA Apply to feet as needed. 42.5 g 11  . Diclofenac Sodium 3 % GEL Apply to left thumb as needed. 100 g 11  . DULoxetine (CYMBALTA) 30 MG capsule  Take 60 mg daily by mouth.    . ergocalciferol (VITAMIN D2) 50000 units capsule Take 50,000 Units by mouth once a week.    . gabapentin (NEURONTIN) 300 MG capsule Taking 362m in morning, 306mmidday, 60053mt bedtime.    . magnesium oxide (MAG-OX) 400 MG tablet Take 400 mg by mouth daily.    . MMarland KitchenGARED OMEGA-3 KRILL OIL PO Take by mouth.    . naproxen (NAPROSYN) 250 MG tablet Take by mouth 2 (two) times daily with a meal.    . OXcarbazepine (TRILEPTAL) 150 MG tablet Take 1 tablet (150 mg total) by mouth 2 (two) times daily. 60 tablet 11  . pramipexole (MIRAPEX) 0.25 MG tablet Take 0.25 mg by mouth daily.    . simvastatin (ZOCOR) 20 MG tablet Take 20 mg by mouth daily.    . traZODone (DESYREL) 50 MG tablet Take 50 mg by mouth at bedtime.     No current facility-administered medications for this visit.     PAST MEDICAL HISTORY: Past Medical History:  Diagnosis Date  . Hypercholesteremia   . Peripheral neuropathy   . Restless leg syndrome     PAST SURGICAL HISTORY: Past Surgical History:  Procedure Laterality Date  . ABDOMINAL HYSTERECTOMY     Age [age] BACK SURGERY    . EYE SURGERY     bilateral cateract   . TONSILLECTOMY      FAMILY HISTORY: Family History  Problem Relation Age of Onset  . Breast cancer Sister   . Other Mother        passed away at age 77 Heart attack  Father        passed away at age 56    SOCIAL HISTORY:  Social History   Socioeconomic History  . Marital status: Widowed    Spouse name: Not on file  . Number of children: 1  . Years of education: 64  . Highest education level: High school graduate  Social Needs  . Financial resource strain: Not on file  . Food insecurity - worry: Not on file  . Food insecurity - inability: Not on file  . Transportation needs - medical: Not on file  . Transportation needs - non-medical: Not on file  Occupational History  . Occupation: Retired  Tobacco Use  . Smoking status: Never Smoker  . Smokeless  tobacco: Current User    Types: Snuff  Substance and Sexual Activity  . Alcohol use: No  . Drug use: No  . Sexual activity: Not on file  Other Topics Concern  . Not on file  Social History Narrative   Lives at home alone.   Right-handed.   1 cup caffeine daily.     PHYSICAL EXAM   There were no vitals filed for this visit.  Not recorded      There is no height or weight on file to calculate BMI.  PHYSICAL EXAMNIATION:  Gen: NAD, conversant, well nourised, obese, well groomed                     Cardiovascular: Regular rate rhythm, no peripheral edema, warm, nontender. Eyes: Conjunctivae clear without exudates or hemorrhage Neck: Supple, no carotid bruits. Pulmonary: Clear to auscultation bilaterally   NEUROLOGICAL EXAM:  MENTAL STATUS: Speech:    Speech is normal; fluent and spontaneous with normal comprehension.  Cognition:     Orientation to time, place and person     Normal recent and remote memory     Normal Attention span and concentration     Normal Language, naming, repeating,spontaneous speech     Fund of knowledge   CRANIAL NERVES: CN II: Visual fields are full to confrontation. Fundoscopic exam is normal with sharp discs and no vascular changes. Pupils are round equal and briskly reactive to light. CN III, IV, VI: extraocular movement are normal. No ptosis. CN V: Facial sensation is intact to pinprick in all 3 divisions bilaterally. Corneal responses are intact.  CN VII: Face is symmetric with normal eye closure and smile. CN VIII: Hearing is normal to rubbing fingers CN IX, X: Palate elevates symmetrically. Phonation is normal. CN XI: Head turning and shoulder shrug are intact CN XII: Tongue is midline with normal movements and no atrophy.  MOTOR: There is no pronator drift of out-stretched arms. Muscle bulk and tone are normal. Muscle strength is normal.  REFLEXES: Reflexes are 2+ and symmetric at the biceps, triceps, knees, and ankles. Plantar  responses are flexor.  SENSORY: Intact to light touch, pinprick, positional sensation and vibratory sensation are intact in fingers and toes.  COORDINATION: Rapid alternating movements and fine finger movements are intact. There is no dysmetria on finger-to-nose and heel-knee-shin.    GAIT/STANCE: Posture is normal. Gait is steady with normal steps, base, arm swing, and turning. She has mild tiptoe, heel walking difficulties, Romberg is absent.   DIAGNOSTIC DATA (LABS, IMAGING, TESTING) - I reviewed patient records, labs, notes, testing and imaging myself where available.   ASSESSMENT AND PLAN  Kimberly [name] is a 76 y.o. 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 Ph.D.  [facility] [address], [place],  [address] Ph: [phone] Fax: [phone]  CC: Jani Gravel, MD

## 2017-07-28 NOTE — Procedures (Signed)
Full Name: Kimberly [name] Gender: Female MRN #: [id number] Date of Birth: 1941/04/10    Visit Date: 07/28/2017 09:11 Age: 76 Years 74 Months Old Examining Physician: Marcial Pacas, MD  Referring Physician: Krista Blue, MD History: 76 years old female complains of progressive worsening bilateral toes paresthesia  Summary of the test: Nerve conduction study: Bilateral sural, superficial peroneal sensory responses were normal.  Bilateral peroneal EDB and tibial motor responses were normal.  Electromyography: Selective needle examinations of bilateral lower extremity muscles, bilateral lumbosacral paraspinal muscles were normal.  Conclusion: This is a normal study.  There is no electrodiagnostic evidence of large fiber peripheral neuropathy or bilateral lumbosacral radiculopathy    ------------------------------- Marcial Pacas M.D.  [facility] [place], [place] [address] Tel: [phone] Fax: [phone]        [facility]    Nerve / Sites Muscle Latency Ref. Amplitude Ref. Rel Amp Segments Distance Velocity Ref. Area    ms ms mV mV %  cm m/s m/s mVms  R Peroneal - EDB     Ankle EDB 4.0 ?6.5 4.7 ?2.0 100 Ankle - EDB 9   14.0     Fib head EDB 9.7  4.2  89.6 Fib head - Ankle 29 51 ?44 12.3     Pop fossa EDB 11.6  4.2  100 Pop fossa - Fib head 10 55 ?44 12.5         Pop fossa - Ankle      L Peroneal - EDB     Ankle EDB 4.2 ?6.5 5.9 ?2.0 100 Ankle - EDB 9   20.0     Fib head EDB 9.8  5.8  99.6 Fib head - Ankle 29 52 ?44 20.9     Pop fossa EDB 11.7  4.7  80.9 Pop fossa - Fib head 10 53 ?44 17.0         Pop fossa - Ankle      R Tibial - AH     Ankle AH 4.4 ?5.8 10.2 ?4.0 100 Ankle - AH 9   29.2     Pop fossa AH 11.8  7.4  72.9 Pop fossa - Ankle 36 49 ?41 24.0  L Tibial - AH     Ankle AH 4.3 ?5.8 10.0 ?4.0 100 Ankle - AH 9   24.8     Pop fossa AH 11.4  6.9  69.3 Pop fossa - Ankle 36 51 ?41 24.5             SNC    Nerve / Sites Rec. Site Peak Lat Ref.  Amp  Ref. Segments Distance    ms ms V V  cm  R Sural - Ankle (Calf)     Calf Ankle 3.3 ?4.4 13 ?6 Calf - Ankle 14  L Sural - Ankle (Calf)     Calf Ankle 3.4 ?4.4 15 ?6 Calf - Ankle 14  R Superficial peroneal - Ankle     Lat leg Ankle 3.6 ?4.4 8 ?6 Lat leg - Ankle 14  L Superficial peroneal - Ankle     Lat leg Ankle 3.7 ?4.4 10 ?6 Lat leg - Ankle 14             F  Wave    Nerve F Lat Ref.   ms ms  R Tibial - AH 48.9 ?56.0  L Tibial - AH 47.2 ?56.0         EMG full  EMG Summary Table    Spontaneous MUAP Recruitment  Muscle IA Fib PSW Fasc Other Amp Dur. Poly Pattern  R. Tibialis anterior Normal None None None _______ Normal Normal Normal Normal  R. Gastrocnemius (Medial head) Normal None None None _______ Normal Normal Normal Normal  R. Vastus lateralis Normal None None None _______ Normal Normal Normal Normal  R. Abductor hallucis Normal None None None _______ Normal Normal Normal Normal  L. Tibialis anterior Normal None None None _______ Normal Normal Normal Normal  L. Gastrocnemius (Medial head) Normal None None None _______ Normal Normal Normal Normal  L. Vastus lateralis Normal None None None _______ Normal Normal Normal Normal  L. Lumbar paraspinals (mid) Normal None None None _______ Normal Normal Normal Normal  L. Lumbar paraspinals (low) Normal None None None _______ Normal Normal Normal Normal  R. Lumbar paraspinals (mid) Normal None None None _______ Normal Normal Normal Normal  R. Lumbar paraspinals (low) Normal None None None _______ Normal Normal Normal Normal

## 2017-08-24 ENCOUNTER — Telehealth: Payer: Self-pay | Admitting: Neurology

## 2017-08-24 NOTE — Telephone Encounter (Signed)
Pt calling at [place] she said Dr Krista Blue told her the labs would be covered by her insurance. Pt said she has rec'd a bill from [facility] for $298.00 and is wanting to know if the proper codes were used. Please call to advise.  Pt also said she is to have a skin biopsy in February and is wanting to know if that will be covered.

## 2017-08-24 NOTE — Telephone Encounter (Signed)
Called [facility] and updated her codes so that her labs will be covered.  [facility] will re-file with her insurance to resolve her bill.  Skin biopsies are not pre-authorized at our office.  I told her it is best to call her insurance just to be sure of coverage.  She verabalized understanding.  She will call me back with any further questions.

## 2017-09-12 ENCOUNTER — Other Ambulatory Visit: Payer: Self-pay | Admitting: Internal Medicine

## 2017-09-12 DIAGNOSIS — Z1231 Encounter for screening mammogram for malignant neoplasm of breast: Secondary | ICD-10-CM

## 2017-10-02 ENCOUNTER — Telehealth: Payer: Self-pay | Admitting: Neurology

## 2017-10-02 ENCOUNTER — Encounter: Payer: Self-pay | Admitting: Neurology

## 2017-10-02 ENCOUNTER — Other Ambulatory Visit: Payer: Self-pay | Admitting: *Deleted

## 2017-10-02 ENCOUNTER — Ambulatory Visit (INDEPENDENT_AMBULATORY_CARE_PROVIDER_SITE_OTHER): Payer: Medicare Other | Admitting: Neurology

## 2017-10-02 VITALS — BP 162/82 | HR 72 | Ht 61.0 in | Wt 161.0 lb

## 2017-10-02 DIAGNOSIS — R202 Paresthesia of skin: Secondary | ICD-10-CM

## 2017-10-02 DIAGNOSIS — G629 Polyneuropathy, unspecified: Secondary | ICD-10-CM | POA: Diagnosis not present

## 2017-10-02 MED ORDER — DULOXETINE HCL 60 MG PO CPEP: 60.0000 mg | ORAL_CAPSULE | Freq: Every day | ORAL | 3 refills | Status: DC

## 2017-10-02 NOTE — Progress Notes (Signed)
Patient was in right lateral recombinant position. Sterile technique. 1% lidocaine with epinephrine was used for local anesthesia. Punctuated skin biopsy was performed. 3 mm skin sample were obtained at at left foot, above left extensor digitorum brevis, and left lateral calf, 10 cm above lateral malleolus, lateral thigh, 20 cm below superior iliac spine.  Patient tolerated the procedure well.  The wound was covered with neosporin antibiotic cream and bandage. 

## 2017-10-02 NOTE — Telephone Encounter (Signed)
error 

## 2017-10-11 ENCOUNTER — Telehealth: Payer: Self-pay | Admitting: *Deleted

## 2017-10-11 NOTE — Telephone Encounter (Signed)
Left message requesting a return call.

## 2017-10-11 NOTE — Telephone Encounter (Signed)
Skin biopsy results received:  Diagnosis:  A) Lt Thigh: Skin with significantly reduced Epidermal Nerve Fiber Density, consistent with small fiber neuropathy. B) Lt Calf: Skin with significantly reduced Epidermal Nerve Fiber Density, consistent with small fiber neuropathy. C) Lt Foot: Skin with significantly reduced Epidermal Nerve Fiber Density, consistent with small fiber neuropathy.  Dr. Krista Blue has reviewed results and formal report has been sent for scanning.

## 2017-10-12 ENCOUNTER — Encounter: Payer: Self-pay | Admitting: *Deleted

## 2017-10-12 MED ORDER — NORTRIPTYLINE HCL 25 MG PO CAPS: 75.0000 mg | ORAL_CAPSULE | Freq: Every day | ORAL | 6 refills | Status: DC

## 2017-10-12 NOTE — Telephone Encounter (Signed)
She is still having discomfort in legs and feet.  Her symptoms are worse at night and she has difficulty putting her feet under the covers.  However, she also is having difficulty wearing shoes during the day. She is currently taking the following medications:  1) duloxetine 60mg , one tab BID (using rx from her PCP, Dr. Maudie Mercury) 2) gabapentin 300mg , one cap in am, one cap midday, two caps at bedtime 3) oxcarbazepine 150mg , one tab BID  She would like to know if any further adjustments can be made to her medication regimen.

## 2017-10-12 NOTE — Telephone Encounter (Signed)
If add on trileptal 150mg  bid did not help her symptoms,   She can taper off trileptal 150mg  qhs for one week, then stop.  Try Nortriptyline 25mg  titrating to 75mg  qhs.

## 2017-10-12 NOTE — Telephone Encounter (Signed)
Spoke to patient.  Per vo by Dr. Krista Blue, take the following:  1) keep same dose of gabapentin 300mg  - one cap in am, one cap midday, two caps at qhs 2) reduce duloxetine 60mg  to one cap daily 3) take oxcarbazepine 150mg  only at bedtime for one week then stop 4) start nortriptlyline 25mg , one cap qhs x 1 week, two caps qhs x 1 week, three caps qhs thereafter  She wrote these instructions down and read them back correctly to me.  The new prescription for nortriptyline has been sent to her local pharmacy.  If the medication is beneficial, she would like future refills sent to [facility] for a 90-day supply.  She will call to let us know.

## 2017-10-12 NOTE — Addendum Note (Signed)
Addended by: Marcial Pacas on: 10/12/2017 09:48 AM   Modules accepted: Orders

## 2017-10-20 ENCOUNTER — Ambulatory Visit
Admission: RE | Admit: 2017-10-20 | Discharge: 2017-10-20 | Disposition: A | Discharge status: Home / Self Care | Payer: Medicare Other | Source: Ambulatory Visit | Attending: Internal Medicine | Admitting: Internal Medicine

## 2017-10-20 DIAGNOSIS — Z1231 Encounter for screening mammogram for malignant neoplasm of breast: Secondary | ICD-10-CM

## 2017-12-06 DIAGNOSIS — Z961 Presence of intraocular lens: Secondary | ICD-10-CM | POA: Diagnosis not present

## 2017-12-06 DIAGNOSIS — H04123 Dry eye syndrome of bilateral lacrimal glands: Secondary | ICD-10-CM | POA: Diagnosis not present

## 2017-12-06 DIAGNOSIS — H524 Presbyopia: Secondary | ICD-10-CM | POA: Diagnosis not present

## 2017-12-14 DIAGNOSIS — E7800 Pure hypercholesterolemia, unspecified: Secondary | ICD-10-CM | POA: Diagnosis not present

## 2017-12-14 DIAGNOSIS — E559 Vitamin D deficiency, unspecified: Secondary | ICD-10-CM | POA: Diagnosis not present

## 2017-12-14 DIAGNOSIS — K7689 Other specified diseases of liver: Secondary | ICD-10-CM | POA: Diagnosis not present

## 2017-12-22 DIAGNOSIS — R05 Cough: Secondary | ICD-10-CM | POA: Diagnosis not present

## 2017-12-22 DIAGNOSIS — Z0000 Encounter for general adult medical examination without abnormal findings: Secondary | ICD-10-CM | POA: Diagnosis not present

## 2017-12-22 DIAGNOSIS — J301 Allergic rhinitis due to pollen: Secondary | ICD-10-CM | POA: Diagnosis not present

## 2018-01-02 ENCOUNTER — Encounter: Payer: Self-pay | Admitting: Neurology

## 2018-01-02 ENCOUNTER — Ambulatory Visit (INDEPENDENT_AMBULATORY_CARE_PROVIDER_SITE_OTHER): Payer: Medicare Other | Admitting: Neurology

## 2018-01-02 VITALS — BP 137/76 | HR 94 | Ht 61.0 in | Wt 169.2 lb

## 2018-01-02 DIAGNOSIS — R202 Paresthesia of skin: Secondary | ICD-10-CM

## 2018-01-02 DIAGNOSIS — G2581 Restless legs syndrome: Secondary | ICD-10-CM

## 2018-01-02 NOTE — Progress Notes (Addendum)
PATIENT: Shane Crutch DOB: 26-Oct-1940  HISTORICAL  Shequita Kiernan is a 77 year old female, seen in refer by her primary care doctor  Jani Gravel a evaluation of peripheral neuropathy, initial evaluation was on July 04 2017.  I have reviewed and summarized the referring note, she has history of hyperlipidemia, anxiety, restless leg symptoms, lumbar decompression in June 2004, she had low back pain, radiating pain to left side before surgery, surgery really has helped her symptoms,  Since 2015, she noticed bilateral toes numbness burning tingly sensation, gradually getting worse, difficulty sleeping at nighttime, urge to move her leg, she was diagnosed with restless leg, over the years, she is on titrating dose of gabapentin, now taking 300/300/600 mg, along with Mirapex 0.25 milligrams every night, trazodone 50 mg every night for sleep,  She denied gait abnormality, denies significant low back pain, no bowel bladder incontinence.  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  She also take Mirapex 0.25 mg every night, she does have the urge to move her leg before she goes to bed,  She could not tolerate Trileptal, because side effect without significantly  helped her symptoms a dose of 150 mg twice a day, it was tapered off,  Laboratory evaluations in November 2018 showed normal negative vitamin D, TSH, CPK, HIV, A1c, ESR, RPR, B12, immunofixative electrophoresis, hemoglobin was 13.3  REVIEW OF SYSTEMS: Full 14 system review of systems performed and notable only for as above  ALLERGIES: Allergies  Allergen Reactions  . Amoxicillin Swelling    Throat felt swollen  . Ciprofloxacin Itching  . Hydrocodone-Acetaminophen Itching  . Sulfa Antibiotics Rash    And itching    HOME MEDICATIONS: Current Outpatient Medications  Medication Sig Dispense Refill  . CALCIUM PO Take 1,200 mg by mouth daily.    Marland Kitchen CRANBERRY PO Take 1 tablet by mouth daily.    . DULoxetine (CYMBALTA) 60 MG capsule Take 1 capsule (60 mg total) by mouth daily. 90 capsule 3  . ergocalciferol (VITAMIN D2) 50000 units capsule Take 50,000 Units by mouth once a week.    . gabapentin (NEURONTIN) 300 MG capsule Take one capsule in morning, one capsule midday, two capsules at bedtime.    Marland Kitchen l-methylfolate-B6-B12 (METANX) 3-35-2 MG TABS tablet Take 2 tablets by mouth daily.    . magnesium oxide (MAG-OX) 400 MG tablet Take 400 mg by mouth daily.    Marland Kitchen MEGARED OMEGA-3 KRILL OIL PO Take by mouth.    . nortriptyline (PAMELOR) 25 MG capsule Take 3 capsules (75 mg total) by mouth at bedtime. 90 capsule 6  . pramipexole (MIRAPEX) 0.25 MG tablet Take 0.25 mg by mouth daily.    . simvastatin (ZOCOR) 40 MG tablet Take 40 mg by mouth daily.    . traZODone (DESYREL)  50 MG tablet Take 50 mg by mouth at bedtime.     No current facility-administered medications for this visit.     PAST MEDICAL HISTORY: Past Medical History:  Diagnosis Date  . Hypercholesteremia   . Peripheral neuropathy   . Restless leg syndrome     PAST SURGICAL HISTORY: Past Surgical History:  Procedure Laterality Date  . ABDOMINAL HYSTERECTOMY     Age 71  . BACK SURGERY    . EYE SURGERY     bilateral cateract   .  TONSILLECTOMY      FAMILY HISTORY: Family History  Problem Relation Age of Onset  . Breast cancer Sister   . Other Mother        passed away at age 10  . Heart attack Father        passed away at age 76    SOCIAL HISTORY:  Social History   Socioeconomic History  . Marital status: Widowed    Spouse name: Not on file  . Number of children: 1  . Years of education: 107  . Highest education level: High school graduate  Occupational History  . Occupation: Retired  Scientific laboratory technician  . Financial resource strain: Not on file  . Food insecurity:    Worry: Not on file    Inability: Not on file  . Transportation needs:    Medical: Not on file    Non-medical: Not on file  Tobacco Use  . Smoking status: Never Smoker  . Smokeless tobacco: Current User    Types: Snuff  Substance and Sexual Activity  . Alcohol use: No  . Drug use: No  . Sexual activity: Not on file  Lifestyle  . Physical activity:    Days per week: Not on file    Minutes per session: Not on file  . Stress: Not on file  Relationships  . Social connections:    Talks on phone: Not on file    Gets together: Not on file    Attends religious service: Not on file    Active member of club or organization: Not on file    Attends meetings of clubs or organizations: Not on file    Relationship status: Not on file  . Intimate partner violence:    Fear of current or ex partner: Not on file    Emotionally abused: Not on file    Physically abused: Not on file    Forced sexual activity: Not on file  Other Topics Concern  . Not on file  Social History Narrative   Lives at home alone.   Right-handed.   1 cup caffeine daily.     PHYSICAL EXAM   Vitals:   01/02/18 1539  BP: 137/76  Pulse: 94  Weight: 169 lb 4 oz (76.8 kg)  Height: '5\' 1"'  (1.549 m)    Not recorded      Body mass index is 31.98 kg/m.  PHYSICAL EXAMNIATION:  Gen: NAD, conversant, well nourised, obese, well groomed                       Cardiovascular: Regular rate rhythm, no peripheral edema, warm, nontender. Eyes: Conjunctivae clear without exudates or hemorrhage Neck: Supple, no carotid bruits. Pulmonary: Clear to auscultation bilaterally   NEUROLOGICAL EXAM:  MENTAL STATUS: Speech:    Speech is normal; fluent and spontaneous with normal comprehension.  Cognition:     Orientation to time, place and person     Normal recent and  remote memory     Normal Attention span and concentration     Normal Language, naming, repeating,spontaneous speech     Fund of knowledge   CRANIAL NERVES: CN II: Visual fields are full to confrontation. Fundoscopic exam is normal with sharp discs and no vascular changes. Pupils are round equal and briskly reactive to light. CN III, IV, VI: extraocular movement are normal. No ptosis. CN V: Facial sensation is intact to pinprick in all 3 divisions bilaterally. Corneal responses are intact.  CN VII: Face is symmetric with normal eye closure and smile. CN VIII: Hearing is normal to rubbing fingers CN IX, X: Palate elevates symmetrically. Phonation is normal. CN XI: Head turning and shoulder shrug are intact CN XII: Tongue is midline with normal movements and no atrophy.  MOTOR: There is no pronator drift of out-stretched arms. Muscle bulk and tone are normal. Muscle strength is normal.  REFLEXES: Reflexes are 2+ and symmetric at the biceps, triceps, knees, and ankles. Plantar responses are flexor.  SENSORY: Intact to light touch, pinprick, positional sensation and vibratory sensation are intact in fingers and toes.  COORDINATION: Rapid alternating movements and fine finger movements are intact. There is no dysmetria on finger-to-nose and heel-knee-shin.    GAIT/STANCE: Posture is normal. Gait is steady with normal steps, base, arm swing, and turning. She has mild tiptoe, heel walking difficulties, Romberg is absent.   DIAGNOSTIC DATA (LABS, IMAGING, TESTING) - I reviewed patient  records, labs, notes, testing and imaging myself where available.   ASSESSMENT AND PLAN  Beatryce Colombo is a 77 y.o. female    Bilateral feet paresthesia Restless leg syndrome  Skin biopsy confirmed small fiber neuropathy,  EMG/NCS showed no large fiber peripheral neuropathy  Keep current polypharmacy, gabapentin 300 mg 4 tablets a day, plus nortriptyline 25 mg 3 capsules at nighttime, Mirapex 0.25 mg every night,  Low back pain,  MRI of lumbar spine to rule out lumbosacral radiculopathy  Marcial Pacas, M.D. Ph.D.  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

## 2018-01-03 ENCOUNTER — Telehealth: Payer: Self-pay | Admitting: Neurology

## 2018-01-03 NOTE — Telephone Encounter (Signed)
Medicare/mutual of [place] auth: [employer] order faxed to [facility] they will reach out to the pt to schedule.

## 2018-01-04 DIAGNOSIS — M5116 Intervertebral disc disorders with radiculopathy, lumbar region: Secondary | ICD-10-CM | POA: Diagnosis not present

## 2018-01-04 DIAGNOSIS — M5117 Intervertebral disc disorders with radiculopathy, lumbosacral region: Secondary | ICD-10-CM | POA: Diagnosis not present

## 2018-01-04 DIAGNOSIS — M47816 Spondylosis without myelopathy or radiculopathy, lumbar region: Secondary | ICD-10-CM | POA: Diagnosis not present

## 2018-01-12 DIAGNOSIS — M810 Age-related osteoporosis without current pathological fracture: Secondary | ICD-10-CM | POA: Diagnosis not present

## 2018-01-19 DIAGNOSIS — E7800 Pure hypercholesterolemia, unspecified: Secondary | ICD-10-CM | POA: Diagnosis not present

## 2018-01-19 DIAGNOSIS — E559 Vitamin D deficiency, unspecified: Secondary | ICD-10-CM | POA: Diagnosis not present

## 2018-01-19 DIAGNOSIS — M25569 Pain in unspecified knee: Secondary | ICD-10-CM | POA: Diagnosis not present

## 2018-01-31 DIAGNOSIS — M25532 Pain in left wrist: Secondary | ICD-10-CM | POA: Diagnosis not present

## 2018-01-31 DIAGNOSIS — M1712 Unilateral primary osteoarthritis, left knee: Secondary | ICD-10-CM | POA: Diagnosis not present

## 2018-05-07 ENCOUNTER — Ambulatory Visit (INDEPENDENT_AMBULATORY_CARE_PROVIDER_SITE_OTHER): Payer: Medicare Other | Admitting: Neurology

## 2018-05-07 ENCOUNTER — Encounter: Payer: Self-pay | Admitting: Neurology

## 2018-05-07 VITALS — BP 151/80 | HR 103 | Ht 64.0 in | Wt 175.0 lb

## 2018-05-07 DIAGNOSIS — G2581 Restless legs syndrome: Secondary | ICD-10-CM

## 2018-05-07 DIAGNOSIS — R269 Unspecified abnormalities of gait and mobility: Secondary | ICD-10-CM

## 2018-05-07 DIAGNOSIS — R202 Paresthesia of skin: Secondary | ICD-10-CM | POA: Diagnosis not present

## 2018-05-07 MED ORDER — NORTRIPTYLINE HCL 25 MG PO CAPS: 75.0000 mg | ORAL_CAPSULE | Freq: Every day | ORAL | 4 refills | Status: DC

## 2018-05-07 NOTE — Progress Notes (Signed)
PATIENT: Kimberly [name] DOB: Dec 14, 1940  HISTORICAL  Kimberly [name] is a 77 year old female, seen in refer by her primary care doctor  Jani Gravel a evaluation of peripheral neuropathy, initial evaluation was on July 04 2017.  I have reviewed and summarized the referring note, she has history of hyperlipidemia, anxiety, restless leg symptoms, lumbar decompression in June 2004, she had low back pain, radiating pain to left side before surgery, surgery really has helped her symptoms,  Since 2015, she noticed bilateral toes numbness burning tingly sensation, gradually getting worse, difficulty sleeping at nighttime, urge to move her leg, she was diagnosed with restless leg, over the years, she is on titrating dose of gabapentin, now taking 300/300/600 mg, along with Mirapex 0.25 milligrams every night, trazodone 50 mg every night for sleep,  She denied gait abnormality, denies significant low back pain, no bowel bladder incontinence.  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    REVIEW OF SYSTEMS: Full 14 system review of systems performed and notable only for as above, rest of the review of system were negative  ALLERGIES: Allergies  Allergen Reactions  . Amoxicillin Swelling    Throat felt swollen  . Ciprofloxacin Itching  . Hydrocodone-Acetaminophen Itching  . Sulfa Antibiotics Rash    And itching    HOME MEDICATIONS: Current Outpatient Medications  Medication Sig Dispense Refill  . CALCIUM PO Take 1,200 mg by mouth daily.    Marland Kitchen CRANBERRY PO Take 1 tablet by mouth daily.    . DULoxetine (CYMBALTA) 60 MG capsule Take 1 capsule (60 mg total) by mouth daily. 90 capsule 3  . ergocalciferol (VITAMIN D2) 50000 units capsule Take 50,000 Units by mouth once a week.    . gabapentin (NEURONTIN) 300 MG capsule Take one capsule in morning, one capsule midday, two capsules at bedtime.    Marland Kitchen l-methylfolate-B6-B12 (METANX) 3-35-2 MG TABS tablet Take 2 tablets by mouth daily.    Marland Kitchen  magnesium oxide (MAG-OX) 400 MG tablet Take 400 mg by mouth daily.    Marland Kitchen MEGARED OMEGA-3 KRILL OIL PO Take by mouth.    . nortriptyline (PAMELOR) 25 MG capsule Take 3 capsules (75 mg total) by mouth at bedtime. 90 capsule 6  . pramipexole (MIRAPEX) 0.25 MG tablet Take 0.25 mg by mouth daily.      . simvastatin (ZOCOR) 40 MG tablet Take 40 mg by mouth daily.    . traZODone (DESYREL) 50 MG tablet Take 50 mg by mouth at bedtime.     No current facility-administered medications for this visit.     PAST MEDICAL HISTORY: Past Medical History:  Diagnosis Date  . Hypercholesteremia   . Peripheral neuropathy   . Restless leg syndrome     PAST SURGICAL HISTORY: Past Surgical History:  Procedure Laterality Date  . ABDOMINAL HYSTERECTOMY     Age 34  . BACK SURGERY    . EYE SURGERY     bilateral cateract   . TONSILLECTOMY      FAMILY HISTORY: Family History  Problem Relation Age of Onset  . Breast cancer Sister   . Other Mother        passed away at age 66  . Heart attack Father        passed away at age 46    SOCIAL HISTORY:  Social History   Socioeconomic History  . Marital status: Widowed    Spouse name: Not on file  . Number of children: 1  . Years of education: 104  . Highest education level: High school graduate  Occupational History  . Occupation: Retired  Scientific laboratory technician  . Financial resource strain: Not on file  . Food insecurity:    Worry: Not on file    Inability: Not on file  . Transportation needs:    Medical: Not on file    Non-medical: Not on file  Tobacco Use  . Smoking status: Never Smoker  . Smokeless tobacco: Current User    Types: Snuff  Substance and Sexual Activity  . Alcohol use: No  . Drug use: No  . Sexual activity: Not on file  Lifestyle  . Physical activity:    Days per week: Not on file    Minutes per session: Not on file  . Stress: Not on file  Relationships  . Social connections:    Talks on phone: Not on file    Gets together: Not on file    Attends religious service: Not on file    Active member of club or organization: Not on file    Attends meetings of clubs or organizations: Not on file    Relationship status: Not on file  . Intimate partner violence:    Fear of current or ex partner: Not on file    Emotionally  abused: Not on file    Physically abused: Not on file    Forced sexual activity: Not on file  Other Topics Concern  . Not on file  Social History Narrative   Lives at home alone.   Right-handed.   1 cup caffeine daily.     PHYSICAL EXAM   Vitals:   05/07/18 0832  BP: (!) 151/80  Pulse: (!) 103  Weight: 175 lb (79.4 kg)  Height: '5\' 4"'  (1.626 m)    Not recorded      Body mass index is 30.04 kg/m.  PHYSICAL EXAMNIATION:  Gen: NAD, conversant, well nourised, obese, well groomed  Cardiovascular: Regular rate rhythm, no peripheral edema, warm, nontender. Eyes: Conjunctivae clear without exudates or hemorrhage Neck: Supple, no carotid bruits. Pulmonary: Clear to auscultation bilaterally   NEUROLOGICAL EXAM:  MENTAL STATUS: Speech:    Speech is normal; fluent and spontaneous with normal comprehension.  Cognition:     Orientation to time, place and person     Normal recent and remote memory     Normal Attention span and concentration     Normal Language, naming, repeating,spontaneous speech     Fund of knowledge   CRANIAL NERVES: CN II: Visual fields are full to confrontation. Fundoscopic exam is normal with sharp discs and no vascular changes. Pupils are round equal and briskly reactive to light. CN III, IV, VI: extraocular movement are normal. No ptosis. CN V: Facial sensation is intact to pinprick in all 3 divisions bilaterally. Corneal responses are intact.  CN VII: Face is symmetric with normal eye closure and smile. CN VIII: Hearing is normal to rubbing fingers CN IX, X: Palate elevates symmetrically. Phonation is normal. CN XI: Head turning and shoulder shrug are intact CN XII: Tongue is midline with normal movements and no atrophy.  MOTOR: There is no pronator drift of out-stretched arms. Muscle bulk and tone are normal. Muscle strength is normal.  REFLEXES: Reflexes are 2+ and symmetric at the biceps, triceps, knees, and ankles. Plantar  responses are flexor.  SENSORY: Intact to light touch, pinprick, positional sensation and vibratory sensation are intact in fingers and toes.  COORDINATION: Rapid alternating movements and fine finger movements are intact. There is no dysmetria on finger-to-nose and heel-knee-shin.    GAIT/STANCE: Posture is normal. Gait is steady with normal steps, base, arm swing, and turning. She has mild tiptoe, heel walking difficulties, Romberg is absent.   DIAGNOSTIC DATA (LABS, IMAGING, TESTING) - I reviewed patient records, labs, notes, testing and imaging myself where available.   ASSESSMENT AND PLAN  Kimberly [name] is a 76 y.o. female    Bilateral feet paresthesia Restless leg syndrome Chronic low back pain, gait abnormality  Skin biopsy confirmed small fiber neuropathy,  EMG/NCS showed no large fiber peripheral neuropathy  Keep current polypharmacy, gabapentin 300 mg 4 tablets a day, plus nortriptyline 25 mg 3 capsules at nighttime, Mirapex 0.25 mg every night,  MRI of lumbar spine showed multilevel disc changes, most severe at L4-5, left parasagittal disc herniation potential left L5 nerve root compression  Referral to physical therapy  Marcial Pacas, M.D. Ph.D.  [facility] [address] [place], [place] [address] Ph: [phone] Fax: [phone]  CC: Jani Gravel, MD

## 2018-05-10 ENCOUNTER — Other Ambulatory Visit: Payer: Self-pay | Admitting: *Deleted

## 2018-05-10 MED ORDER — NORTRIPTYLINE HCL 25 MG PO CAPS: 75.0000 mg | ORAL_CAPSULE | Freq: Every day | ORAL | 3 refills | Status: DC

## 2018-07-16 DIAGNOSIS — E7800 Pure hypercholesterolemia, unspecified: Secondary | ICD-10-CM | POA: Diagnosis not present

## 2018-07-16 DIAGNOSIS — E559 Vitamin D deficiency, unspecified: Secondary | ICD-10-CM | POA: Diagnosis not present

## 2018-07-17 ENCOUNTER — Other Ambulatory Visit: Payer: Self-pay | Admitting: Neurology

## 2018-07-24 DIAGNOSIS — E7800 Pure hypercholesterolemia, unspecified: Secondary | ICD-10-CM | POA: Diagnosis not present

## 2018-07-24 DIAGNOSIS — M1712 Unilateral primary osteoarthritis, left knee: Secondary | ICD-10-CM | POA: Diagnosis not present

## 2018-07-24 DIAGNOSIS — M810 Age-related osteoporosis without current pathological fracture: Secondary | ICD-10-CM | POA: Diagnosis not present

## 2018-07-24 DIAGNOSIS — G629 Polyneuropathy, unspecified: Secondary | ICD-10-CM | POA: Diagnosis not present

## 2018-07-24 DIAGNOSIS — G2581 Restless legs syndrome: Secondary | ICD-10-CM | POA: Diagnosis not present

## 2018-07-24 DIAGNOSIS — E559 Vitamin D deficiency, unspecified: Secondary | ICD-10-CM | POA: Diagnosis not present

## 2018-09-01 DIAGNOSIS — M1712 Unilateral primary osteoarthritis, left knee: Secondary | ICD-10-CM | POA: Diagnosis not present

## 2018-09-01 DIAGNOSIS — M25572 Pain in left ankle and joints of left foot: Secondary | ICD-10-CM | POA: Diagnosis not present

## 2018-09-10 DIAGNOSIS — M1712 Unilateral primary osteoarthritis, left knee: Secondary | ICD-10-CM | POA: Diagnosis not present

## 2018-09-13 DIAGNOSIS — M1712 Unilateral primary osteoarthritis, left knee: Secondary | ICD-10-CM | POA: Diagnosis not present

## 2018-09-13 DIAGNOSIS — M6281 Muscle weakness (generalized): Secondary | ICD-10-CM | POA: Diagnosis not present

## 2018-09-14 ENCOUNTER — Other Ambulatory Visit: Payer: Self-pay | Admitting: Internal Medicine

## 2018-09-14 DIAGNOSIS — Z1231 Encounter for screening mammogram for malignant neoplasm of breast: Secondary | ICD-10-CM

## 2018-09-17 DIAGNOSIS — M1712 Unilateral primary osteoarthritis, left knee: Secondary | ICD-10-CM | POA: Diagnosis not present

## 2018-09-19 DIAGNOSIS — M6281 Muscle weakness (generalized): Secondary | ICD-10-CM | POA: Diagnosis not present

## 2018-09-19 DIAGNOSIS — M1712 Unilateral primary osteoarthritis, left knee: Secondary | ICD-10-CM | POA: Diagnosis not present

## 2018-09-21 DIAGNOSIS — M1712 Unilateral primary osteoarthritis, left knee: Secondary | ICD-10-CM | POA: Diagnosis not present

## 2018-09-21 DIAGNOSIS — M6281 Muscle weakness (generalized): Secondary | ICD-10-CM | POA: Diagnosis not present

## 2018-09-24 DIAGNOSIS — M6281 Muscle weakness (generalized): Secondary | ICD-10-CM | POA: Diagnosis not present

## 2018-09-24 DIAGNOSIS — M1712 Unilateral primary osteoarthritis, left knee: Secondary | ICD-10-CM | POA: Diagnosis not present

## 2018-09-27 DIAGNOSIS — M6281 Muscle weakness (generalized): Secondary | ICD-10-CM | POA: Diagnosis not present

## 2018-09-27 DIAGNOSIS — M1712 Unilateral primary osteoarthritis, left knee: Secondary | ICD-10-CM | POA: Diagnosis not present

## 2018-10-01 DIAGNOSIS — M1712 Unilateral primary osteoarthritis, left knee: Secondary | ICD-10-CM | POA: Diagnosis not present

## 2018-10-01 DIAGNOSIS — M6281 Muscle weakness (generalized): Secondary | ICD-10-CM | POA: Diagnosis not present

## 2018-10-03 DIAGNOSIS — M6281 Muscle weakness (generalized): Secondary | ICD-10-CM | POA: Diagnosis not present

## 2018-10-03 DIAGNOSIS — M1712 Unilateral primary osteoarthritis, left knee: Secondary | ICD-10-CM | POA: Diagnosis not present

## 2018-10-08 DIAGNOSIS — M6281 Muscle weakness (generalized): Secondary | ICD-10-CM | POA: Diagnosis not present

## 2018-10-08 DIAGNOSIS — M1712 Unilateral primary osteoarthritis, left knee: Secondary | ICD-10-CM | POA: Diagnosis not present

## 2018-10-10 DIAGNOSIS — M6281 Muscle weakness (generalized): Secondary | ICD-10-CM | POA: Diagnosis not present

## 2018-10-10 DIAGNOSIS — M1712 Unilateral primary osteoarthritis, left knee: Secondary | ICD-10-CM | POA: Diagnosis not present

## 2018-10-18 DIAGNOSIS — M6281 Muscle weakness (generalized): Secondary | ICD-10-CM | POA: Diagnosis not present

## 2018-10-18 DIAGNOSIS — M1712 Unilateral primary osteoarthritis, left knee: Secondary | ICD-10-CM | POA: Diagnosis not present

## 2018-10-22 ENCOUNTER — Ambulatory Visit
Admission: RE | Admit: 2018-10-22 | Discharge: 2018-10-22 | Disposition: A | Discharge status: Home / Self Care | Payer: Medicare Other | Source: Ambulatory Visit | Attending: Internal Medicine | Admitting: Internal Medicine

## 2018-10-22 DIAGNOSIS — Z1231 Encounter for screening mammogram for malignant neoplasm of breast: Secondary | ICD-10-CM | POA: Diagnosis not present

## 2018-10-24 DIAGNOSIS — M6281 Muscle weakness (generalized): Secondary | ICD-10-CM | POA: Diagnosis not present

## 2018-10-24 DIAGNOSIS — M1712 Unilateral primary osteoarthritis, left knee: Secondary | ICD-10-CM | POA: Diagnosis not present

## 2018-10-25 ENCOUNTER — Telehealth: Payer: Self-pay | Admitting: Neurology

## 2018-10-25 ENCOUNTER — Other Ambulatory Visit: Payer: Self-pay | Admitting: *Deleted

## 2018-10-25 MED ORDER — NORTRIPTYLINE HCL 25 MG PO CAPS: 75.0000 mg | ORAL_CAPSULE | Freq: Every day | ORAL | 3 refills | Status: DC

## 2018-10-25 MED ORDER — DULOXETINE HCL 60 MG PO CPEP: 60.0000 mg | ORAL_CAPSULE | Freq: Every day | ORAL | 3 refills | Status: DC

## 2018-10-25 NOTE — Addendum Note (Signed)
Addended by: Noberto Retort C on: 10/25/2018 12:00 PM   Modules accepted: Orders

## 2018-10-25 NOTE — Telephone Encounter (Signed)
I spoke to the patient and she confirmed she would like to use [place] mail order pharmacy.  She needs both Cymbalta and nortriptyline sent in for her.  She has a pending yearly follow up in September.

## 2018-10-25 NOTE — Telephone Encounter (Signed)
Pt is asking for a refill on her DULoxetine (CYMBALTA) 60 MG capsule 90 day.  Pt has new insurance [employer] [id number] Group#[id number] [phone] Customer Service [phone] Pt states this needs to be mail order

## 2018-10-26 DIAGNOSIS — M1712 Unilateral primary osteoarthritis, left knee: Secondary | ICD-10-CM | POA: Diagnosis not present

## 2018-10-26 DIAGNOSIS — M6281 Muscle weakness (generalized): Secondary | ICD-10-CM | POA: Diagnosis not present

## 2018-10-29 NOTE — Telephone Encounter (Signed)
Pt said Dr Maudie Mercury is out on sick leave, she spoke with Dr Krista Blue about sending her meds to mail order while he was out. She needs the following:  ergocalciferol (VITAMIN D2) 50000 units capsule, gabapentin (NEURONTIN) 300 MG capsule, pramipexole (MIRAPEX) 0.25 MG tablet, simvastatin (ZOCOR) 40 MG tablet and traZODone (DESYREL) 50 MG tablet sent to [facility] order. Please call the patient to advise at [phone]

## 2018-10-29 NOTE — Telephone Encounter (Signed)
Per vo by Dr. Krista Blue, she will need to contact Dr. Julianne Rice office and request refills by managed by the provider covering his patients while he is out.  The patient verbalized understanding and will call them today.

## 2018-10-30 ENCOUNTER — Telehealth: Payer: Self-pay | Admitting: Neurology

## 2018-10-30 DIAGNOSIS — M1712 Unilateral primary osteoarthritis, left knee: Secondary | ICD-10-CM | POA: Diagnosis not present

## 2018-10-30 DIAGNOSIS — M6281 Muscle weakness (generalized): Secondary | ICD-10-CM | POA: Diagnosis not present

## 2018-10-30 MED ORDER — DULOXETINE HCL 60 MG PO CPEP: 60.0000 mg | ORAL_CAPSULE | Freq: Every day | ORAL | 3 refills | Status: DC

## 2018-10-30 MED ORDER — NORTRIPTYLINE HCL 25 MG PO CAPS: 75.0000 mg | ORAL_CAPSULE | Freq: Every day | ORAL | 3 refills | Status: DC

## 2018-10-30 NOTE — Telephone Encounter (Signed)
Both prescriptions have been sent to the requested [facility].  I called the patient and left her a message letting her know this has been taken care of for her.

## 2018-10-30 NOTE — Telephone Encounter (Signed)
Pt's said the refills for Cymbalta and nortriptyline sent to [facility]. She rec'd a letter that her insurance does not cover the mail order anymore. Please call to advise when it has been sent

## 2018-11-01 DIAGNOSIS — M6281 Muscle weakness (generalized): Secondary | ICD-10-CM | POA: Diagnosis not present

## 2018-11-01 DIAGNOSIS — M1712 Unilateral primary osteoarthritis, left knee: Secondary | ICD-10-CM | POA: Diagnosis not present

## 2018-11-06 DIAGNOSIS — M6281 Muscle weakness (generalized): Secondary | ICD-10-CM | POA: Diagnosis not present

## 2018-11-06 DIAGNOSIS — M1712 Unilateral primary osteoarthritis, left knee: Secondary | ICD-10-CM | POA: Diagnosis not present

## 2019-01-24 DIAGNOSIS — E7800 Pure hypercholesterolemia, unspecified: Secondary | ICD-10-CM | POA: Diagnosis not present

## 2019-01-24 DIAGNOSIS — N390 Urinary tract infection, site not specified: Secondary | ICD-10-CM | POA: Diagnosis not present

## 2019-01-24 DIAGNOSIS — E559 Vitamin D deficiency, unspecified: Secondary | ICD-10-CM | POA: Diagnosis not present

## 2019-01-31 DIAGNOSIS — M810 Age-related osteoporosis without current pathological fracture: Secondary | ICD-10-CM | POA: Diagnosis not present

## 2019-02-13 DIAGNOSIS — H04123 Dry eye syndrome of bilateral lacrimal glands: Secondary | ICD-10-CM | POA: Diagnosis not present

## 2019-02-13 DIAGNOSIS — Z961 Presence of intraocular lens: Secondary | ICD-10-CM | POA: Diagnosis not present

## 2019-02-18 DIAGNOSIS — G629 Polyneuropathy, unspecified: Secondary | ICD-10-CM | POA: Diagnosis not present

## 2019-02-18 DIAGNOSIS — Z780 Asymptomatic menopausal state: Secondary | ICD-10-CM | POA: Diagnosis not present

## 2019-02-18 DIAGNOSIS — Z0000 Encounter for general adult medical examination without abnormal findings: Secondary | ICD-10-CM | POA: Diagnosis not present

## 2019-02-18 DIAGNOSIS — M549 Dorsalgia, unspecified: Secondary | ICD-10-CM | POA: Diagnosis not present

## 2019-02-18 DIAGNOSIS — M25569 Pain in unspecified knee: Secondary | ICD-10-CM | POA: Diagnosis not present

## 2019-02-18 DIAGNOSIS — R4189 Other symptoms and signs involving cognitive functions and awareness: Secondary | ICD-10-CM | POA: Diagnosis not present

## 2019-02-18 DIAGNOSIS — M810 Age-related osteoporosis without current pathological fracture: Secondary | ICD-10-CM | POA: Diagnosis not present

## 2019-02-18 DIAGNOSIS — G2581 Restless legs syndrome: Secondary | ICD-10-CM | POA: Diagnosis not present

## 2019-02-18 DIAGNOSIS — E7800 Pure hypercholesterolemia, unspecified: Secondary | ICD-10-CM | POA: Diagnosis not present

## 2019-02-20 DIAGNOSIS — M25569 Pain in unspecified knee: Secondary | ICD-10-CM | POA: Diagnosis not present

## 2019-02-20 DIAGNOSIS — M79642 Pain in left hand: Secondary | ICD-10-CM | POA: Diagnosis not present

## 2019-02-20 DIAGNOSIS — M19042 Primary osteoarthritis, left hand: Secondary | ICD-10-CM | POA: Diagnosis not present

## 2019-02-20 DIAGNOSIS — M1120 Other chondrocalcinosis, unspecified site: Secondary | ICD-10-CM | POA: Diagnosis not present

## 2019-02-20 DIAGNOSIS — M11242 Other chondrocalcinosis, left hand: Secondary | ICD-10-CM | POA: Diagnosis not present

## 2019-02-20 DIAGNOSIS — M1711 Unilateral primary osteoarthritis, right knee: Secondary | ICD-10-CM | POA: Diagnosis not present

## 2019-02-20 DIAGNOSIS — M11261 Other chondrocalcinosis, right knee: Secondary | ICD-10-CM | POA: Diagnosis not present

## 2019-02-20 DIAGNOSIS — M79643 Pain in unspecified hand: Secondary | ICD-10-CM | POA: Diagnosis not present

## 2019-02-20 DIAGNOSIS — M25462 Effusion, left knee: Secondary | ICD-10-CM | POA: Diagnosis not present

## 2019-02-20 DIAGNOSIS — M159 Polyosteoarthritis, unspecified: Secondary | ICD-10-CM | POA: Diagnosis not present

## 2019-02-20 DIAGNOSIS — M25562 Pain in left knee: Secondary | ICD-10-CM | POA: Diagnosis not present

## 2019-02-20 DIAGNOSIS — M25461 Effusion, right knee: Secondary | ICD-10-CM | POA: Diagnosis not present

## 2019-02-20 DIAGNOSIS — M1712 Unilateral primary osteoarthritis, left knee: Secondary | ICD-10-CM | POA: Diagnosis not present

## 2019-02-20 DIAGNOSIS — M25561 Pain in right knee: Secondary | ICD-10-CM | POA: Diagnosis not present

## 2019-02-20 DIAGNOSIS — M1990 Unspecified osteoarthritis, unspecified site: Secondary | ICD-10-CM | POA: Diagnosis not present

## 2019-02-20 DIAGNOSIS — M11241 Other chondrocalcinosis, right hand: Secondary | ICD-10-CM | POA: Diagnosis not present

## 2019-02-20 DIAGNOSIS — M11262 Other chondrocalcinosis, left knee: Secondary | ICD-10-CM | POA: Diagnosis not present

## 2019-02-20 DIAGNOSIS — M19041 Primary osteoarthritis, right hand: Secondary | ICD-10-CM | POA: Diagnosis not present

## 2019-02-20 DIAGNOSIS — M79641 Pain in right hand: Secondary | ICD-10-CM | POA: Diagnosis not present

## 2019-03-19 DIAGNOSIS — M25569 Pain in unspecified knee: Secondary | ICD-10-CM | POA: Diagnosis not present

## 2019-03-19 DIAGNOSIS — M159 Polyosteoarthritis, unspecified: Secondary | ICD-10-CM | POA: Diagnosis not present

## 2019-03-19 DIAGNOSIS — M1712 Unilateral primary osteoarthritis, left knee: Secondary | ICD-10-CM | POA: Diagnosis not present

## 2019-03-19 DIAGNOSIS — M79643 Pain in unspecified hand: Secondary | ICD-10-CM | POA: Diagnosis not present

## 2019-03-19 DIAGNOSIS — M1120 Other chondrocalcinosis, unspecified site: Secondary | ICD-10-CM | POA: Diagnosis not present

## 2019-03-19 DIAGNOSIS — M1180 Other specified crystal arthropathies, unspecified site: Secondary | ICD-10-CM | POA: Diagnosis not present

## 2019-03-19 DIAGNOSIS — M1711 Unilateral primary osteoarthritis, right knee: Secondary | ICD-10-CM | POA: Diagnosis not present

## 2019-03-19 DIAGNOSIS — M79606 Pain in leg, unspecified: Secondary | ICD-10-CM | POA: Diagnosis not present

## 2019-03-19 DIAGNOSIS — M1990 Unspecified osteoarthritis, unspecified site: Secondary | ICD-10-CM | POA: Diagnosis not present

## 2019-03-25 ENCOUNTER — Encounter: Payer: Self-pay | Admitting: Neurology

## 2019-03-25 ENCOUNTER — Other Ambulatory Visit: Payer: Self-pay

## 2019-03-25 ENCOUNTER — Ambulatory Visit (INDEPENDENT_AMBULATORY_CARE_PROVIDER_SITE_OTHER): Payer: Medicare Other | Admitting: Neurology

## 2019-03-25 VITALS — BP 155/93 | HR 99 | Temp 97.8°F | Ht 64.0 in | Wt 178.5 lb

## 2019-03-25 DIAGNOSIS — M545 Low back pain, unspecified: Secondary | ICD-10-CM

## 2019-03-25 DIAGNOSIS — R202 Paresthesia of skin: Secondary | ICD-10-CM | POA: Diagnosis not present

## 2019-03-25 DIAGNOSIS — G8929 Other chronic pain: Secondary | ICD-10-CM

## 2019-03-25 DIAGNOSIS — G2581 Restless legs syndrome: Secondary | ICD-10-CM

## 2019-03-25 MED ORDER — GABAPENTIN 300 MG PO CAPS: ORAL_CAPSULE | ORAL | 4 refills | Status: DC

## 2019-03-25 NOTE — Progress Notes (Signed)
PATIENT: Kimberly [name] DOB: 02-15-1941  HISTORICAL  Kimberly [name] is a 78 year old female, seen in refer by her primary care doctor  Jani Gravel a evaluation of peripheral neuropathy, initial evaluation was on July 04 2017.  I have reviewed and summarized the referring note, she has history of hyperlipidemia, anxiety, restless leg symptoms, lumbar decompression in June 2004, she had low back pain, radiating pain to left side before surgery, surgery really has helped her symptoms,  Since 2015, she noticed bilateral toes numbness burning tingly sensation, gradually getting worse, difficulty sleeping at nighttime, urge to move her leg, she was diagnosed with restless leg, over the years, she is on titrating dose of gabapentin, now taking 300/300/600 mg, along with Mirapex 0.25 milligrams every night, trazodone 50 mg every night for sleep,  She denied gait abnormality, denies significant low back pain, no bowel bladder incontinence.  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   UPDATE March 25 2019: She can sleep well now taking gabapentin 300 mg 1 in the morning, 1 at noon, 2 at bedtime, Mirapex 0.25 mg every night, nortriptyline 25 mg 3 tablets every night, trazodone 50 mg every night  She also complains of bilateral knee pain, left worse than right, has received left knee injection which is helpful, she also complains of left ankle swelling   REVIEW OF SYSTEMS: Full 14 system review of systems performed and notable only for as above, rest of the review of system were negative  ALLERGIES: Allergies  Allergen Reactions  . Amoxicillin Swelling    Throat felt swollen  . Ciprofloxacin Itching  . Hydrocodone-Acetaminophen Itching  . Other Other (See Comments)    States turns red at the site  . Sulfa Antibiotics Rash    And itching    HOME MEDICATIONS: Current Outpatient Medications  Medication Sig Dispense Refill  . CALCIUM PO Take 1,200 mg by mouth daily.    . colchicine 0.6 MG  tablet Take 0.6 mg by mouth daily.    Marland Kitchen CRANBERRY PO Take 1 tablet by mouth daily.    . DULoxetine (CYMBALTA) 60 MG capsule Take 1 capsule (60 mg total) by mouth daily. 90 capsule 3  . ergocalciferol (VITAMIN D2) 50000 units capsule Take 50,000 Units by mouth once a week.    .  gabapentin (NEURONTIN) 300 MG capsule Take one capsule in morning, one capsule midday, two capsules at bedtime.    Marland Kitchen l-methylfolate-B6-B12 (METANX) 3-35-2 MG TABS tablet Take 2 tablets by mouth daily.    . magnesium oxide (MAG-OX) 400 MG tablet Take 400 mg by mouth daily.    Marland Kitchen MEGARED OMEGA-3 KRILL OIL PO Take by mouth.    . nortriptyline (PAMELOR) 25 MG capsule Take 3 capsules (75 mg total) by mouth at bedtime. 270 capsule 3  . pramipexole (MIRAPEX) 0.25 MG tablet Take 0.25 mg by mouth daily.    . simvastatin (ZOCOR) 40 MG tablet Take 40 mg by mouth daily.    . traZODone (DESYREL) 50 MG tablet Take 50 mg by mouth at bedtime.     No current facility-administered medications for this visit.     PAST MEDICAL HISTORY: Past Medical History:  Diagnosis Date  . Hypercholesteremia   . Peripheral neuropathy   . Restless leg syndrome     PAST SURGICAL HISTORY: Past Surgical History:  Procedure Laterality Date  . ABDOMINAL HYSTERECTOMY     Age 60  . BACK SURGERY    . EYE SURGERY     bilateral cateract   . TONSILLECTOMY      FAMILY HISTORY: Family History  Problem Relation Age of Onset  . Breast cancer Sister   . Other Mother        passed away at age 10  . Heart attack Father        passed away at age 27    SOCIAL HISTORY:  Social History   Socioeconomic History  . Marital status: Widowed    Spouse name: Not on file  . Number of children: 1  . Years of education: 28  . Highest education level: High school graduate  Occupational History  . Occupation: Retired  Scientific laboratory technician  . Financial resource strain: Not on file  . Food insecurity    Worry: Not on file    Inability: Not on file  . Transportation needs    Medical: Not on file    Non-medical: Not on file  Tobacco Use  . Smoking status: Never Smoker  . Smokeless tobacco: Current User    Types: Snuff  Substance and Sexual Activity  . Alcohol use: No  . Drug use: No  . Sexual activity: Not on file  Lifestyle   . Physical activity    Days per week: Not on file    Minutes per session: Not on file  . Stress: Not on file  Relationships  . Social Herbalist on phone: Not on file    Gets together: Not on file    Attends religious service: Not on file    Active member of club or organization: Not on file    Attends meetings of clubs or organizations: Not on file    Relationship status: Not on file  . Intimate partner violence    Fear of current or ex partner: Not on file    Emotionally abused: Not on file    Physically abused: Not on file    Forced sexual activity: Not  on file  Other Topics Concern  . Not on file  Social History Narrative   Lives at home alone.   Right-handed.   1 cup caffeine daily.     PHYSICAL EXAM   Vitals:   03/25/19 0854  BP: (!) 155/93  Pulse: 99  Temp: 97.8 F (36.6 C)  Weight: 178 lb 8 oz (81 kg)  Height: '5\' 4"'  (1.626 m)    Not recorded      Body mass index is 30.64 kg/m.  PHYSICAL EXAMNIATION:  Gen: NAD, conversant, well nourised, obese, well groomed                     Cardiovascular: Regular rate rhythm, no peripheral edema, warm, nontender. Eyes: Conjunctivae clear without exudates or hemorrhage Neck: Supple, no carotid bruits. Pulmonary: Clear to auscultation bilaterally   NEUROLOGICAL EXAM:  MENTAL STATUS: Speech:    Speech is normal; fluent and spontaneous with normal comprehension.  Cognition:     Orientation to time, place and person     Normal recent and remote memory     Normal Attention span and concentration     Normal Language, naming, repeating,spontaneous speech     Fund of knowledge   CRANIAL NERVES: CN II: Visual fields are full to confrontation.  Pupils are round equal and briskly reactive to light. CN III, IV, VI: extraocular movement are normal. No ptosis. CN V: Facial sensation is intact to pinprick in all 3 divisions bilaterally. Corneal responses are intact.  CN VII: Face is symmetric with normal eye  closure and smile. CN VIII: Hearing is normal to rubbing fingers CN IX, X: Palate elevates symmetrically. Phonation is normal. CN XI: Head turning and shoulder shrug are intact CN XII: Tongue is midline with normal movements and no atrophy.  MOTOR: There is no pronator drift of out-stretched arms. Muscle bulk and tone are normal. Muscle strength is normal.  REFLEXES: Reflexes are 2+ and symmetric at the biceps, triceps, knees, and ankles. Plantar responses are flexor.  SENSORY: Intact to light touch, pinprick, positional sensation and vibratory sensation are intact in fingers and toes.  COORDINATION: Rapid alternating movements and fine finger movements are intact. There is no dysmetria on finger-to-nose and heel-knee-shin.    GAIT/STANCE: She needs pushed up to get up from seated position, mildly antalgic   DIAGNOSTIC DATA (LABS, IMAGING, TESTING) - I reviewed patient records, labs, notes, testing and imaging myself where available.   ASSESSMENT AND PLAN  Kimberly [name] is a 78 y.o. female    Bilateral feet paresthesia Restless leg syndrome Chronic low back pain, gait abnormality  Skin biopsy confirmed small fiber neuropathy,  EMG/NCS showed no large fiber peripheral neuropathy or lumbosacral radiculopathy  MRI of lumbar spine showed multilevel disc changes, most severe at L4-5, left parasagittal disc herniation potential left L5 nerve root compression  Keep current polypharmacy, gabapentin 300 mg 4 tablets a day, plus nortriptyline 25 mg 3 capsules at nighttime,   I have suggested her stop Mirapex  Return to clinic in 6 months with nurse practitioner Thea Alken, M.D. Ph.D.  [facility] [address] [place], [place] [address] Ph: [phone] Fax: [phone]  CC: Jani Gravel, MD

## 2019-04-11 DIAGNOSIS — M79645 Pain in left finger(s): Secondary | ICD-10-CM | POA: Diagnosis not present

## 2019-04-11 DIAGNOSIS — M25572 Pain in left ankle and joints of left foot: Secondary | ICD-10-CM | POA: Diagnosis not present

## 2019-04-12 IMAGING — MG DIGITAL SCREENING BILATERAL MAMMOGRAM WITH TOMO AND CAD
8 series · 9 of 24 positions shown · non-contrast
Comparison: Previous exam(s).

CLINICAL DATA: Screening.

EXAM:
DIGITAL SCREENING BILATERAL MAMMOGRAM WITH TOMO AND CAD

[R CC synth-2D]
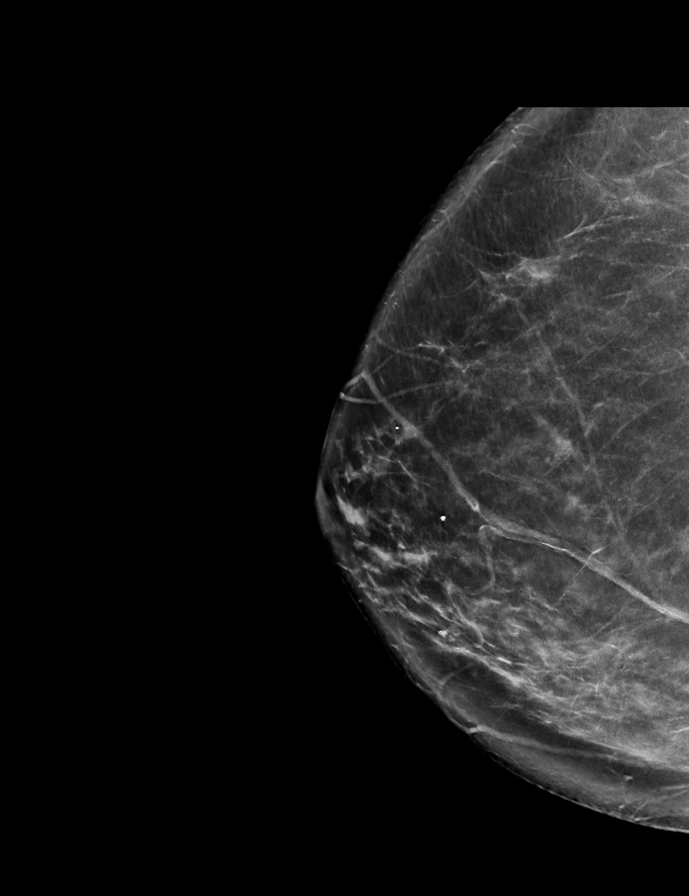

[R MLO synth-2D]
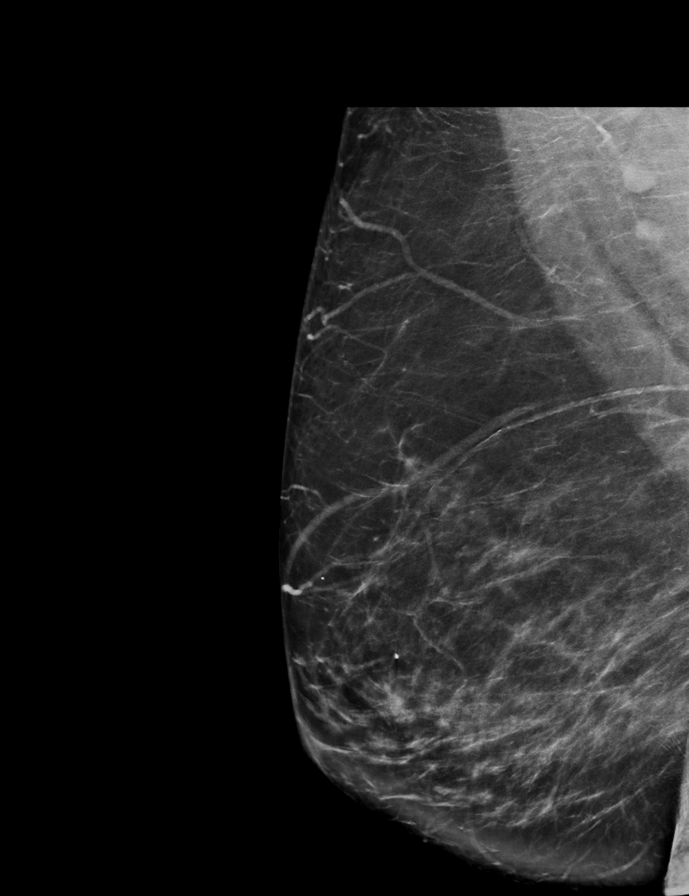

[L CC synth-2D]
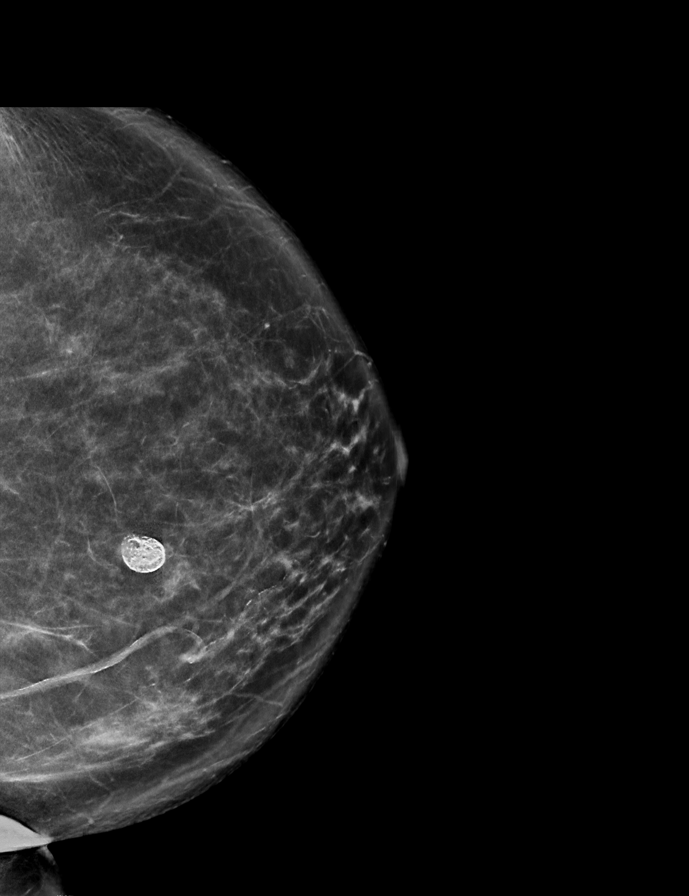

[L MLO synth-2D]
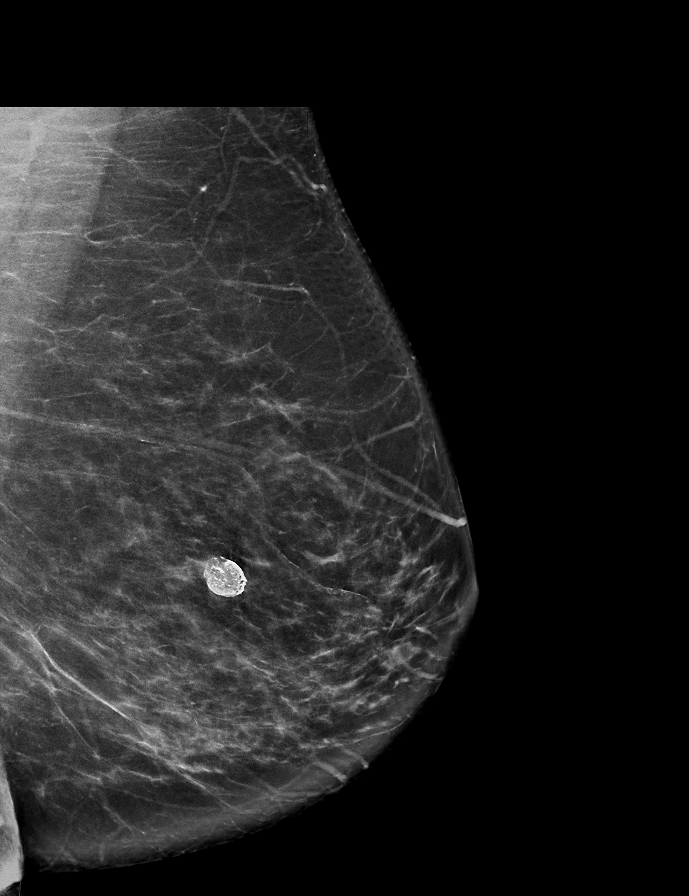

[L CC tomo · 2 of 81 frames shown]
[frame 27/81]
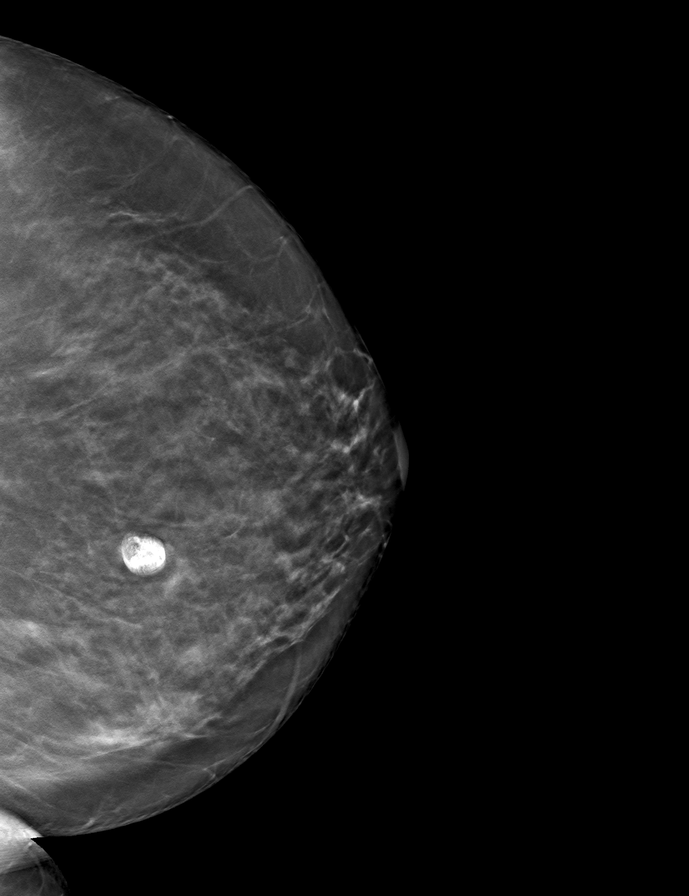
[frame 41/81]
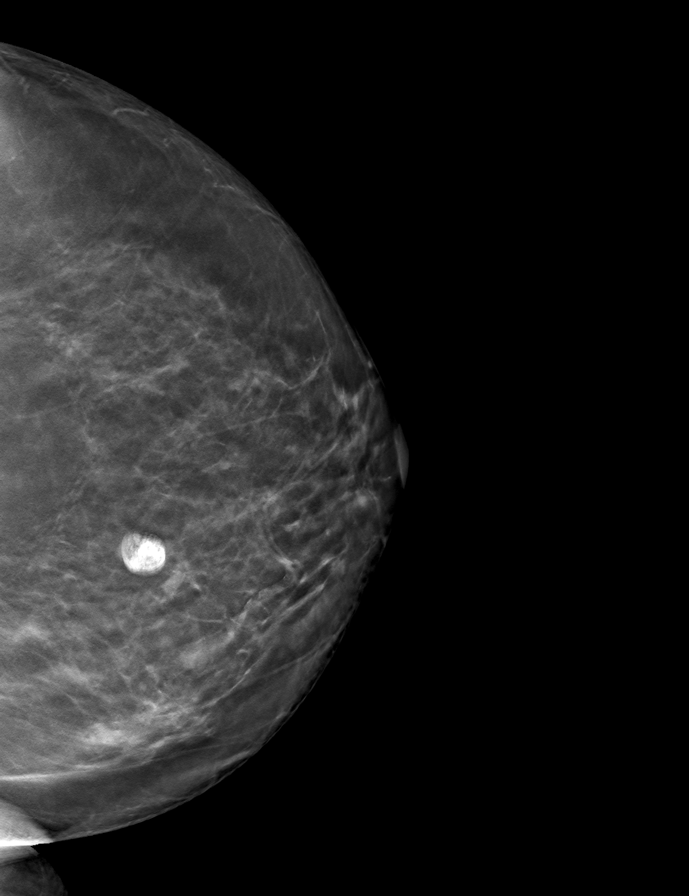

[L MLO tomo · tomo slice 42/83.0]
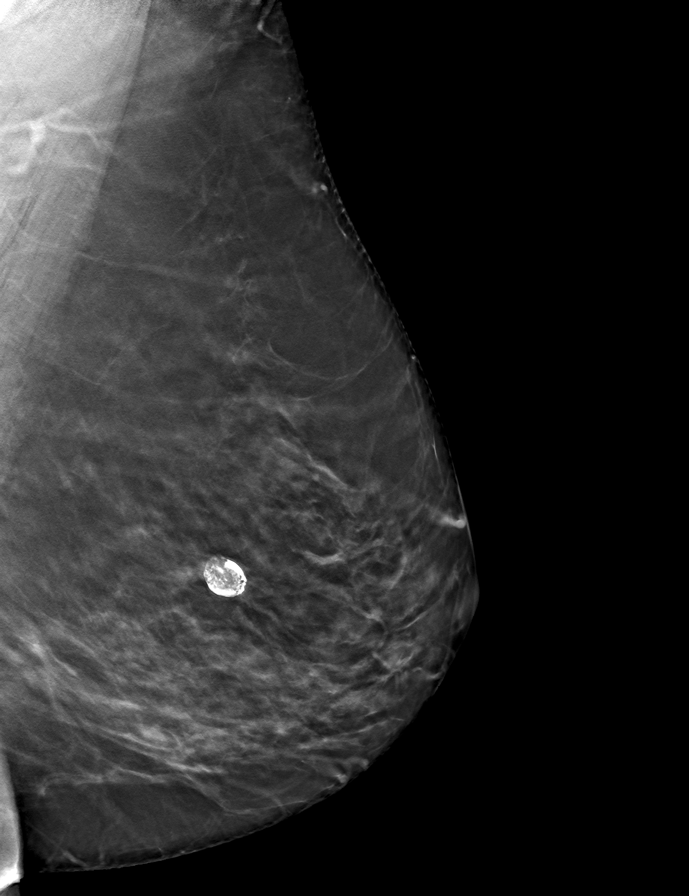

[R CC tomo · tomo slice 39/78.0]
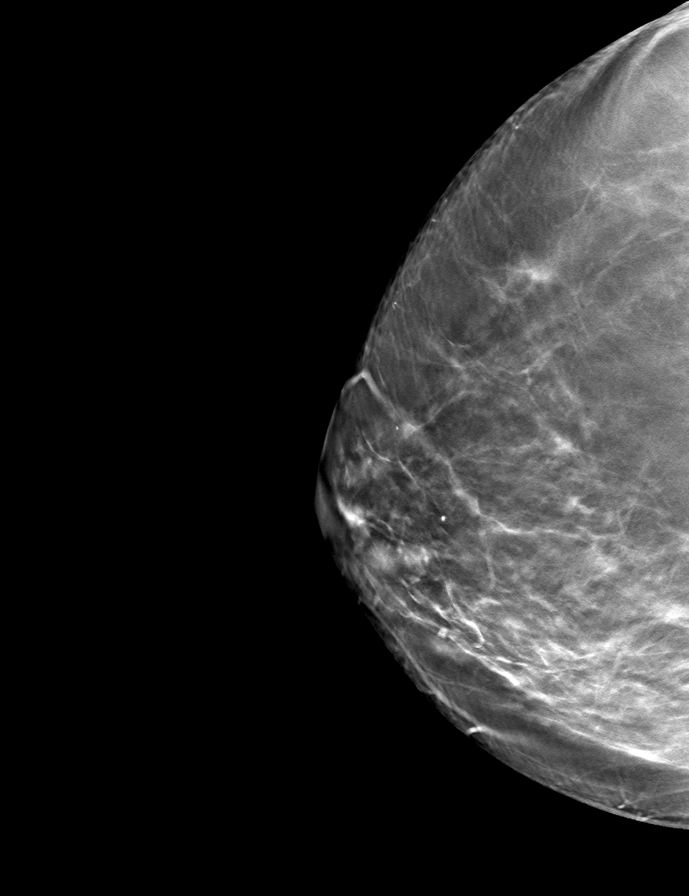

[R MLO tomo · tomo slice 41/82.0]
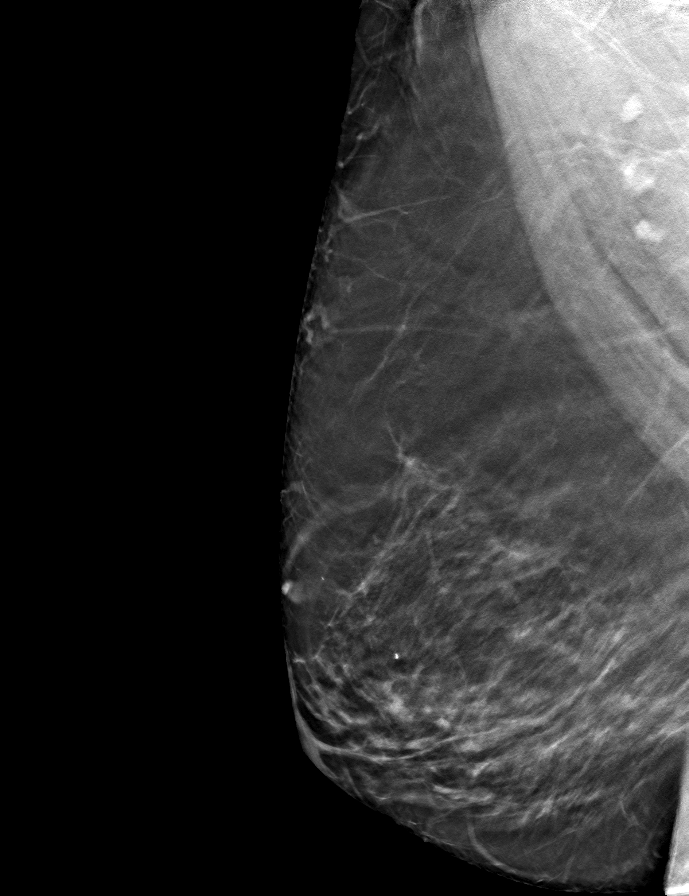

[9 of 24 positions shown; findings below may reference images not displayed]

ACR Breast Density Category b: There are scattered areas of
fibroglandular density.
FINDINGS: There are no findings suspicious for malignancy. Images were
processed with CAD.
IMPRESSION: No mammographic evidence of malignancy. A result letter of this
screening mammogram will be mailed directly to the patient.

RECOMMENDATION:
Screening mammogram in one year. (Code:[id number])

BI-RADS CATEGORY  1: Negative.

## 2019-05-28 ENCOUNTER — Ambulatory Visit: Payer: Self-pay | Admitting: Neurology

## 2019-06-19 DIAGNOSIS — M79643 Pain in unspecified hand: Secondary | ICD-10-CM | POA: Diagnosis not present

## 2019-06-19 DIAGNOSIS — M25569 Pain in unspecified knee: Secondary | ICD-10-CM | POA: Diagnosis not present

## 2019-06-19 DIAGNOSIS — M1711 Unilateral primary osteoarthritis, right knee: Secondary | ICD-10-CM | POA: Diagnosis not present

## 2019-06-19 DIAGNOSIS — M1990 Unspecified osteoarthritis, unspecified site: Secondary | ICD-10-CM | POA: Diagnosis not present

## 2019-06-19 DIAGNOSIS — M159 Polyosteoarthritis, unspecified: Secondary | ICD-10-CM | POA: Diagnosis not present

## 2019-06-19 DIAGNOSIS — M1180 Other specified crystal arthropathies, unspecified site: Secondary | ICD-10-CM | POA: Diagnosis not present

## 2019-06-19 DIAGNOSIS — M1120 Other chondrocalcinosis, unspecified site: Secondary | ICD-10-CM | POA: Diagnosis not present

## 2019-06-19 DIAGNOSIS — M1712 Unilateral primary osteoarthritis, left knee: Secondary | ICD-10-CM | POA: Diagnosis not present

## 2019-08-05 DIAGNOSIS — M1712 Unilateral primary osteoarthritis, left knee: Secondary | ICD-10-CM | POA: Diagnosis not present

## 2019-08-05 DIAGNOSIS — M79642 Pain in left hand: Secondary | ICD-10-CM | POA: Diagnosis not present

## 2019-08-21 DIAGNOSIS — R609 Edema, unspecified: Secondary | ICD-10-CM | POA: Diagnosis not present

## 2019-08-21 DIAGNOSIS — M810 Age-related osteoporosis without current pathological fracture: Secondary | ICD-10-CM | POA: Diagnosis not present

## 2019-08-21 DIAGNOSIS — R251 Tremor, unspecified: Secondary | ICD-10-CM | POA: Diagnosis not present

## 2019-08-21 DIAGNOSIS — R413 Other amnesia: Secondary | ICD-10-CM | POA: Diagnosis not present

## 2019-08-21 DIAGNOSIS — E7800 Pure hypercholesterolemia, unspecified: Secondary | ICD-10-CM | POA: Diagnosis not present

## 2019-08-29 DIAGNOSIS — R600 Localized edema: Secondary | ICD-10-CM | POA: Diagnosis not present

## 2019-09-11 DIAGNOSIS — M84374A Stress fracture, right foot, initial encounter for fracture: Secondary | ICD-10-CM | POA: Diagnosis not present

## 2019-09-16 DIAGNOSIS — M25532 Pain in left wrist: Secondary | ICD-10-CM | POA: Diagnosis not present

## 2019-09-18 DIAGNOSIS — Z961 Presence of intraocular lens: Secondary | ICD-10-CM | POA: Diagnosis not present

## 2019-09-18 DIAGNOSIS — H04123 Dry eye syndrome of bilateral lacrimal glands: Secondary | ICD-10-CM | POA: Diagnosis not present

## 2019-09-24 ENCOUNTER — Ambulatory Visit: Payer: Medicare Other | Admitting: Neurology

## 2019-09-25 DIAGNOSIS — Z0000 Encounter for general adult medical examination without abnormal findings: Secondary | ICD-10-CM | POA: Diagnosis not present

## 2019-09-25 DIAGNOSIS — M810 Age-related osteoporosis without current pathological fracture: Secondary | ICD-10-CM | POA: Diagnosis not present

## 2019-09-25 DIAGNOSIS — R251 Tremor, unspecified: Secondary | ICD-10-CM | POA: Diagnosis not present

## 2019-09-25 DIAGNOSIS — R7303 Prediabetes: Secondary | ICD-10-CM | POA: Diagnosis not present

## 2019-09-25 DIAGNOSIS — E7800 Pure hypercholesterolemia, unspecified: Secondary | ICD-10-CM | POA: Diagnosis not present

## 2019-09-25 DIAGNOSIS — R413 Other amnesia: Secondary | ICD-10-CM | POA: Diagnosis not present

## 2019-10-02 ENCOUNTER — Other Ambulatory Visit: Payer: Self-pay | Admitting: Internal Medicine

## 2019-10-02 DIAGNOSIS — Z1231 Encounter for screening mammogram for malignant neoplasm of breast: Secondary | ICD-10-CM

## 2019-10-09 DIAGNOSIS — M19042 Primary osteoarthritis, left hand: Secondary | ICD-10-CM | POA: Diagnosis not present

## 2019-11-01 DIAGNOSIS — M25561 Pain in right knee: Secondary | ICD-10-CM | POA: Diagnosis not present

## 2019-11-01 DIAGNOSIS — M25562 Pain in left knee: Secondary | ICD-10-CM | POA: Diagnosis not present

## 2019-11-01 DIAGNOSIS — M1712 Unilateral primary osteoarthritis, left knee: Secondary | ICD-10-CM | POA: Diagnosis not present

## 2019-11-01 DIAGNOSIS — M79642 Pain in left hand: Secondary | ICD-10-CM | POA: Diagnosis not present

## 2019-11-12 ENCOUNTER — Ambulatory Visit
Admission: RE | Admit: 2019-11-12 | Discharge: 2019-11-12 | Disposition: A | Discharge status: Home / Self Care | Payer: Medicare Other | Source: Ambulatory Visit | Attending: Internal Medicine | Admitting: Internal Medicine

## 2019-11-12 ENCOUNTER — Other Ambulatory Visit: Payer: Self-pay

## 2019-11-12 DIAGNOSIS — Z1231 Encounter for screening mammogram for malignant neoplasm of breast: Secondary | ICD-10-CM

## 2019-11-20 ENCOUNTER — Other Ambulatory Visit: Payer: Self-pay | Admitting: Neurology

## 2019-11-21 NOTE — Patient Instructions (Signed)
DUE TO COVID-19 ONLY ONE VISITOR IS ALLOWED TO COME WITH YOU AND STAY IN THE WAITING ROOM ONLY DURING PRE OP AND PROCEDURE DAY OF SURGERY. THE 1 VISITOR MAY VISIT WITH YOU AFTER SURGERY IN YOUR PRIVATE ROOM DURING VISITING HOURS ONLY!  YOU NEED TO HAVE A COVID 19 TEST ON 11-29-19 @_______ , THIS TEST MUST BE DONE BEFORE SURGERY, COME  [place] , [address].  ([place]) ONCE YOUR COVID TEST IS COMPLETED, PLEASE BEGIN THE QUARANTINE INSTRUCTIONS AS OUTLINED IN YOUR HANDOUT.                Ezmae Navea Secundino  11/21/2019   Your procedure is scheduled on: 12-03-19   Report to [facility] Main  Entrance    Report to Admitting at 5:30 AM     Call this number if you have problems the morning of surgery [phone]    Remember: NO SOLID FOOD AFTER MIDNIGHT THE NIGHT PRIOR TO SURGERY. NOTHING BY MOUTH EXCEPT CLEAR LIQUIDS UNTIL 5:30 AM . PLEASE FINISH ENSURE DRINK PER SURGEON ORDER  WHICH NEEDS TO BE COMPLETED AT 5:30 AM .   CLEAR LIQUID DIET   Foods Allowed                                                                     Foods Excluded  Coffee and tea, regular and decaf                             liquids that you cannot  Plain Jell-O any favor except red or purple                                           see through such as: Fruit ices (not with fruit pulp)                                     milk, soups, orange juice  Iced Popsicles                                    All solid food Carbonated beverages, regular and diet                                    Cranberry, grape and apple juices Sports drinks like Gatorade Lightly seasoned clear broth or consume(fat free) Sugar, honey syrup  _____________________________________________________________________       Take these medicines the morning of surgery with A SIP OF WATER: Duloxetine (Cymbalta), and Gabapentin (Neurontin)  BRUSH YOUR TEETH MORNING OF SURGERY AND RINSE YOUR MOUTH OUT,  NO CHEWING GUM CANDY OR MINTS.                             You may not have any metal on your body including hair pins  and              piercings     Do not wear jewelry, make-up, lotions, powders or deodorant              Do not wear nail polish on your fingernails.  Do not shave  48 hours prior to surgery.            Do not bring valuables to the hospital. [place].  Contacts, dentures or bridgework may not be worn into surgery.  You may bring your overnight bag         Special Instructions: N/A              Please read over the following fact sheets you were given: _____________________________________________________________________             [facility] - Preparing for Surgery Before surgery, you can play an important role.  Because skin is not sterile, your skin needs to be as free of germs as possible.  You can reduce the number of germs on your skin by washing with CHG (chlorahexidine gluconate) soap before surgery.  CHG is an antiseptic cleaner which kills germs and bonds with the skin to continue killing germs even after washing. Please DO NOT use if you have an allergy to CHG or antibacterial soaps.  If your skin becomes reddened/irritated stop using the CHG and inform your nurse when you arrive at Short Stay. Do not shave (including legs and underarms) for at least 48 hours prior to the first CHG shower.  You may shave your face/neck. Please follow these instructions carefully:  1.  Shower with CHG Soap the night before surgery and the  morning of Surgery.  2.  If you choose to wash your hair, wash your hair first as usual with your  normal  shampoo.  3.  After you shampoo, rinse your hair and body thoroughly to remove the  shampoo.                           4.  Use CHG as you would any other liquid soap.  You can apply chg directly  to the skin and wash                       Gently with a scrungie or clean  washcloth.  5.  Apply the CHG Soap to your body ONLY FROM THE NECK DOWN.   Do not use on face/ open                           Wound or open sores. Avoid contact with eyes, ears mouth and genitals (private parts).                       Wash face,  Genitals (private parts) with your normal soap.             6.  Wash thoroughly, paying special attention to the area where your surgery  will be performed.  7.  Thoroughly rinse your body with warm water from the neck down.  8.  DO NOT shower/wash with your normal soap after using and rinsing off  the CHG Soap.  9.  Pat yourself dry with a clean towel.            10.  Wear clean pajamas.            11.  Place clean sheets on your bed the night of your first shower and do not  sleep with pets. Day of Surgery : Do not apply any lotions/deodorants the morning of surgery.  Please wear clean clothes to the hospital/surgery center.  FAILURE TO FOLLOW THESE INSTRUCTIONS MAY RESULT IN THE CANCELLATION OF YOUR SURGERY PATIENT SIGNATURE_________________________________  NURSE SIGNATURE__________________________________  ________________________________________________________________________   Adam Phenix  An incentive spirometer is a tool that can help keep your lungs clear and active. This tool measures how well you are filling your lungs with each breath. Taking long deep breaths may help reverse or decrease the chance of developing breathing (pulmonary) problems (especially infection) following:  A long period of time when you are unable to move or be active. BEFORE THE PROCEDURE   If the spirometer includes an indicator to show your best effort, your nurse or respiratory therapist will set it to a desired goal.  If possible, sit up straight or lean slightly forward. Try not to slouch.  Hold the incentive spirometer in an upright position. INSTRUCTIONS FOR USE  1. Sit on the edge of your bed if possible, or sit up as far  as you can in bed or on a chair. 2. Hold the incentive spirometer in an upright position. 3. Breathe out normally. 4. Place the mouthpiece in your mouth and seal your lips tightly around it. 5. Breathe in slowly and as deeply as possible, raising the piston or the ball toward the top of the column. 6. Hold your breath for 3-5 seconds or for as long as possible. Allow the piston or ball to fall to the bottom of the column. 7. Remove the mouthpiece from your mouth and breathe out normally. 8. Rest for a few seconds and repeat Steps 1 through 7 at least 10 times every 1-2 hours when you are awake. Take your time and take a few normal breaths between deep breaths. 9. The spirometer may include an indicator to show your best effort. Use the indicator as a goal to work toward during each repetition. 10. After each set of 10 deep breaths, practice coughing to be sure your lungs are clear. If you have an incision (the cut made at the time of surgery), support your incision when coughing by placing a pillow or rolled up towels firmly against it. Once you are able to get out of bed, walk around indoors and cough well. You may stop using the incentive spirometer when instructed by your caregiver.  RISKS AND COMPLICATIONS  Take your time so you do not get dizzy or light-headed.  If you are in pain, you may need to take or ask for pain medication before doing incentive spirometry. It is harder to take a deep breath if you are having pain. AFTER USE  Rest and breathe slowly and easily.  It can be helpful to keep track of a log of your progress. Your caregiver can provide you with a simple table to help with this. If you are using the spirometer at home, follow these instructions: [place] IF:   You are having difficultly using the spirometer.  You have trouble using the spirometer as often as instructed.  Your pain medication is not giving enough relief while using the spirometer.  You  develop fever of 100.5 F (38.1 C) or higher. SEEK IMMEDIATE MEDICAL CARE IF:   You cough up bloody sputum that had not been present before.  You develop fever of 102 F (38.9 C) or greater.  You develop worsening pain at or near the incision site. MAKE SURE YOU:   Understand these instructions.  Will watch your condition.  Will get help right away if you are not doing well or get worse. Document Released: 12/26/2006 Document Revised: 11/07/2011 Document Reviewed: 02/26/2007 Exit[employer] Patient Information 2014 Exit[employer], [place].   ________________________________________________________________________  WHAT IS A BLOOD TRANSFUSION? Blood Transfusion Information  A transfusion is the replacement of blood or some of its parts. Blood is made up of multiple cells which provide different functions.  Red blood cells carry oxygen and are used for blood loss replacement.  White blood cells fight against infection.  Platelets control bleeding.  Plasma helps clot blood.  Other blood products are available for specialized needs, such as hemophilia or other clotting disorders. BEFORE THE TRANSFUSION  Who gives blood for transfusions?   Healthy volunteers who are fully evaluated to make sure their blood is safe. This is blood bank blood. Transfusion therapy is the safest it has ever been in the practice of medicine. Before blood is taken from a donor, a complete history is taken to make sure that person has no history of diseases nor engages in risky social behavior (examples are intravenous drug use or sexual activity with multiple partners). The donor's travel history is screened to minimize risk of transmitting infections, such as malaria. The donated blood is tested for signs of infectious diseases, such as HIV and hepatitis. The blood is then tested to be sure it is compatible with you in order to minimize the chance of a transfusion reaction. If you or a relative donates blood, this is  often done in anticipation of surgery and is not appropriate for emergency situations. It takes many days to process the donated blood. RISKS AND COMPLICATIONS Although transfusion therapy is very safe and saves many lives, the main dangers of transfusion include:   Getting an infectious disease.  Developing a transfusion reaction. This is an allergic reaction to something in the blood you were given. Every precaution is taken to prevent this. The decision to have a blood transfusion has been considered carefully by your caregiver before blood is given. Blood is not given unless the benefits outweigh the risks. AFTER THE TRANSFUSION  Right after receiving a blood transfusion, you will usually feel much better and more energetic. This is especially true if your red blood cells have gotten low (anemic). The transfusion raises the level of the red blood cells which carry oxygen, and this usually causes an energy increase.  The nurse administering the transfusion will monitor you carefully for complications. HOME CARE INSTRUCTIONS  No special instructions are needed after a transfusion. You may find your energy is better. Speak with your caregiver about any limitations on activity for underlying diseases you may have. SEEK MEDICAL CARE IF:   Your condition is not improving after your transfusion.  You develop redness or irritation at the intravenous (IV) site. SEEK IMMEDIATE MEDICAL CARE IF:  Any of the following symptoms occur over the next 12 hours:  Shaking chills.  You have a temperature by mouth above 102 F (38.9 C), not controlled by medicine.  Chest, back, or muscle pain.  People around you feel you are not acting correctly or are confused.  Shortness of breath  or difficulty breathing.  Dizziness and fainting.  You get a rash or develop hives.  You have a decrease in urine output.  Your urine turns a dark color or changes to pink, red, or brown. Any of the following  symptoms occur over the next 10 days:  You have a temperature by mouth above 102 F (38.9 C), not controlled by medicine.  Shortness of breath.  Weakness after normal activity.  The white part of the eye turns yellow (jaundice).  You have a decrease in the amount of urine or are urinating less often.  Your urine turns a dark color or changes to pink, red, or brown. Document Released: 08/12/2000 Document Revised: 11/07/2011 Document Reviewed: 03/31/2008 [facility] Patient Information 2014 [place], [place].  _______________________________________________________________________

## 2019-11-21 NOTE — Progress Notes (Signed)
PCP - Jani Gravel, MD Cardiologist -   Chest x-ray -  EKG -  Stress Test -  ECHO -  Cardiac Cath -   Sleep Study -  CPAP -   Fasting Blood Sugar -  Checks Blood Sugar _____ times a day  Blood Thinner Instructions: Aspirin Instructions: Last Dose:  Anesthesia review:   Patient denies shortness of breath, fever, cough and chest pain at PAT appointment   Patient verbalized understanding of instructions that were given to them at the PAT appointment. Patient was also instructed that they will need to review over the PAT instructions again at home before surgery.

## 2019-11-25 ENCOUNTER — Other Ambulatory Visit: Payer: Self-pay

## 2019-11-25 ENCOUNTER — Encounter (HOSPITAL_COMMUNITY)
Admission: RE | Admit: 2019-11-25 | Discharge: 2019-11-25 | Disposition: A | Discharge status: Home / Self Care | Payer: Medicare Other | Source: Ambulatory Visit | Attending: Orthopedic Surgery | Admitting: Orthopedic Surgery

## 2019-11-25 ENCOUNTER — Encounter (HOSPITAL_COMMUNITY): Payer: Self-pay

## 2019-11-25 DIAGNOSIS — Z01812 Encounter for preprocedural laboratory examination: Secondary | ICD-10-CM | POA: Diagnosis not present

## 2019-11-25 HISTORY — DX: Other complications of anesthesia, initial encounter: T88.59XA

## 2019-11-25 LAB — CBC
HCT: 43.3 % (ref 36.0–46.0)
Hemoglobin: 13.9 g/dL (ref 12.0–15.0)
MCH: 30.3 pg (ref 26.0–34.0)
MCHC: 32.1 g/dL (ref 30.0–36.0)
MCV: 94.5 fL (ref 80.0–100.0)
Platelets: 322 10*3/uL (ref 150–400)
RBC: 4.58 MIL/uL (ref 3.87–5.11)
RDW: 13 % (ref 11.5–15.5)
WBC: 6.6 10*3/uL (ref 4.0–10.5)
nRBC: 0 % (ref 0.0–0.2)

## 2019-11-25 LAB — SURGICAL PCR SCREEN
MRSA, PCR: NEGATIVE
Staphylococcus aureus: NEGATIVE

## 2019-11-25 LAB — ABO/RH: ABO/RH(D): A POS

## 2019-11-27 DIAGNOSIS — Z01818 Encounter for other preprocedural examination: Secondary | ICD-10-CM | POA: Diagnosis not present

## 2019-11-27 DIAGNOSIS — M159 Polyosteoarthritis, unspecified: Secondary | ICD-10-CM | POA: Diagnosis not present

## 2019-11-27 DIAGNOSIS — M549 Dorsalgia, unspecified: Secondary | ICD-10-CM | POA: Diagnosis not present

## 2019-11-29 ENCOUNTER — Other Ambulatory Visit (HOSPITAL_COMMUNITY)
Admission: RE | Admit: 2019-11-29 | Discharge: 2019-11-29 | Disposition: A | Discharge status: Home / Self Care | Payer: Medicare Other | Source: Ambulatory Visit | Attending: Orthopedic Surgery | Admitting: Orthopedic Surgery

## 2019-11-29 DIAGNOSIS — Z20822 Contact with and (suspected) exposure to covid-19: Secondary | ICD-10-CM | POA: Insufficient documentation

## 2019-11-29 DIAGNOSIS — Z01812 Encounter for preprocedural laboratory examination: Secondary | ICD-10-CM | POA: Diagnosis not present

## 2019-11-29 LAB — SARS CORONAVIRUS 2 (TAT 6-24 HRS): SARS Coronavirus 2: NEGATIVE

## 2019-12-02 ENCOUNTER — Encounter (HOSPITAL_COMMUNITY): Payer: Self-pay | Admitting: Orthopedic Surgery

## 2019-12-02 NOTE — Anesthesia Preprocedure Evaluation (Addendum)
Anesthesia Evaluation  Patient identified by MRN, date of birth, ID band Patient awake    Reviewed: Allergy & Precautions, NPO status , Patient's Chart, lab work & pertinent test results  History of Anesthesia Complications (+) history of anesthetic complications  Airway Mallampati: II  TM Distance: >3 FB Neck ROM: Full    Dental no notable dental hx. (+) Teeth Intact   Pulmonary neg pulmonary ROS,    Pulmonary exam normal breath sounds clear to auscultation       Cardiovascular negative cardio ROS Normal cardiovascular exam Rhythm:Regular Rate:Normal     Neuro/Psych Peripheral neuropathy Restless legs syndrome  Neuromuscular disease negative psych ROS   GI/Hepatic negative GI ROS, Neg liver ROS,   Endo/Other  Hypercholesterolemia  Renal/GU negative Renal ROS  negative genitourinary   Musculoskeletal  (+) Arthritis , Osteoarthritis,  Left knee OA   Abdominal   Peds  Hematology negative hematology ROS (+)   Anesthesia Other Findings   Reproductive/Obstetrics                            Anesthesia Physical Anesthesia Plan  ASA: II  Anesthesia Plan: Spinal   Post-op Pain Management:  Regional for Post-op pain   Induction:   PONV Risk Score and Plan: 3 and Ondansetron, Treatment may vary due to age or medical condition and Propofol infusion  Airway Management Planned: Natural Airway, Simple Face Mask and Nasal Cannula  Additional Equipment:   Intra-op Plan:   Post-operative Plan:   Informed Consent: I have reviewed the patients History and Physical, chart, labs and discussed the procedure including the risks, benefits and alternatives for the proposed anesthesia with the patient or authorized representative who has indicated his/her understanding and acceptance.     Dental advisory given  Plan Discussed with: CRNA and Surgeon  Anesthesia Plan Comments:         Anesthesia Quick Evaluation

## 2019-12-02 NOTE — H&P (Signed)
TOTAL KNEE ADMISSION H&P  Patient is being admitted for left total knee arthroplasty.  Subjective:  Chief Complaint:   Left knee primary OA / pain  HPI: Kimberly [name], 79 y.o. female, has a history of pain and functional disability in the left knee due to arthritis and has failed non-surgical conservative treatments for greater than 12 weeks to includeNSAID's and/or analgesics, corticosteriod injections, viscosupplementation injections and activity modification.  Onset of symptoms was gradual, starting 1 years ago with gradually worsening course since that time. The patient noted no past surgery on the left knee(s).  Patient currently rates pain in the left knee(s) at 8 out of 10 with activity. Patient has worsening of pain with activity and weight bearing, pain that interferes with activities of daily living, pain with passive range of motion, crepitus and joint swelling.  Patient has evidence of periarticular osteophytes and joint space narrowing by imaging studies.  There is no active infection.  Risks, benefits and expectations were discussed with the patient.  Risks including but not limited to the risk of anesthesia, blood clots, nerve damage, blood vessel damage, failure of the prosthesis, infection and up to and including death.  Patient understand the risks, benefits and expectations and wishes to proceed with surgery.   PCP: Jani Gravel, MD  D/C Plans:       Home   Post-op Meds:       No Rx given   Tranexamic Acid:      To be given - IV   Decadron:      Is to be given  FYI:       ASA  Norco  DME:   Pt will have equipment   PT:   Page: [facility]    Patient Active Problem List   Diagnosis Date Noted  . Chronic low back pain 03/25/2019  . Gait abnormality 05/07/2018  . Restless leg syndrome 07/28/2017  . Paresthesia 07/04/2017  . Restless leg 07/04/2017   Past Medical History:  Diagnosis Date  . Complication of anesthesia    Pt  reports that she was told "someone had to breath for her" She is not clear what this means  . Hypercholesteremia   . Peripheral neuropathy   . Restless leg syndrome     Past Surgical History:  Procedure Laterality Date  . ABDOMINAL HYSTERECTOMY     Age 29  . BACK SURGERY    . EYE SURGERY     bilateral cateract   . TONSILLECTOMY      No current facility-administered medications for this encounter.   Current Outpatient Medications  Medication Sig Dispense Refill Last Dose  . acetaminophen (TYLENOL) 500 MG tablet Take 500-1,000 mg by mouth every 6 (six) hours as needed (for pain.).     Marland Kitchen CALCIUM PO Take 1,200 mg by mouth daily.     Marland Kitchen CRANBERRY PO Take 1 tablet by mouth daily.     . ergocalciferol (VITAMIN D2) 50000 units capsule Take 50,000 Units by mouth once a week.     . gabapentin (NEURONTIN) 300 MG capsule Take one capsule in morning, one capsule midday, two capsules at bedtime. (Patient taking differently: Take 300-600 mg by mouth See admin instructions. Take one capsule in morning, one capsule midday, two capsules at bedtime.) 360 capsule 4   . magnesium oxide (MAG-OX) 400 MG tablet Take 400 mg by mouth daily.     Marland Kitchen MEGARED OMEGA-3 KRILL OIL PO Take 1 capsule by mouth  once a week.      Vladimir Faster Glycol-Propyl Glycol (SYSTANE) 0.4-0.3 % SOLN Place 1 drop into both eyes 3 (three) times daily as needed (dry/irritated eyes).     . pramipexole (MIRAPEX) 0.25 MG tablet Take 0.25 mg by mouth at bedtime.      . simvastatin (ZOCOR) 40 MG tablet Take 40 mg by mouth at bedtime.      . traZODone (DESYREL) 50 MG tablet Take 50 mg by mouth at bedtime.     . DULoxetine (CYMBALTA) 60 MG capsule Take 1 capsule by mouth once daily 90 capsule 0   . nortriptyline (PAMELOR) 25 MG capsule TAKE 3 CAPSULES BY MOUTH AT BEDTIME 270 capsule 0    Allergies  Allergen Reactions  . Amoxicillin Swelling    Throat felt swollen Did it involve swelling of the face/tongue/throat, SOB, or low BP? Yes Did it  involve sudden or severe rash/hives, skin peeling, or any reaction on the inside of your mouth or nose? No Did you need to seek medical attention at a hospital or doctor's office? No When did it last happen?More than 30 years ago If all above answers are "NO", may proceed with cephalosporin use.   . Ciprofloxacin Itching  . Adhesive [Tape] Other (See Comments)    Redness/itching.  Marland Kitchen Hydrocodone-Acetaminophen Itching  . Sulfa Antibiotics Rash    And itching    Social History   Tobacco Use  . Smoking status: Never Smoker  . Smokeless tobacco: Never Used  Substance Use Topics  . Alcohol use: No    Family History  Problem Relation Age of Onset  . Breast cancer Sister   . Other Mother        passed away at age 13  . Heart attack Father        passed away at age 44     Review of Systems  Constitutional: Negative.   HENT: Negative.   Eyes: Negative.   Respiratory: Negative.   Cardiovascular: Negative.   Gastrointestinal: Negative.   Genitourinary: Negative.   Musculoskeletal: Positive for back pain and joint pain.  Skin: Negative.   Neurological: Negative.   Endo/Heme/Allergies: Negative.   Psychiatric/Behavioral: Negative.       Objective:  Physical Exam  Constitutional: She is oriented to person, place, and time. She appears well-developed.  HENT:  Head: Normocephalic.  Eyes: Pupils are equal, round, and reactive to light.  Neck: No JVD present. No tracheal deviation present. No thyromegaly present.  Cardiovascular: Normal rate, regular rhythm and intact distal pulses.  Respiratory: Effort normal and breath sounds normal. No respiratory distress. She has no wheezes.  GI: Soft. There is no abdominal tenderness. There is no guarding.  Musculoskeletal:     Cervical back: Neck supple.     Left knee: Swelling and bony tenderness present. No deformity, erythema, ecchymosis or lacerations. Tenderness present.  Lymphadenopathy:    She has no cervical adenopathy.   Neurological: She is alert and oriented to person, place, and time. A sensory deficit (bilateral LE neuropathy) is present.  Skin: Skin is warm and dry.  Psychiatric: She has a normal mood and affect.     Labs:  Estimated body mass index is 29.56 kg/m as calculated from the following:   Height as of 11/25/19: 5\' 4"  (1.626 m).   Weight as of 11/25/19: 78.1 kg.   Imaging Review Plain radiographs demonstrate severe degenerative joint disease of the left knee(s). The bone quality appears to be good for age and reported  activity level.      Assessment/Plan:  End stage arthritis, left knee   The patient history, physical examination, clinical judgment of the provider and imaging studies are consistent with end stage degenerative joint disease of the left knee(s) and total knee arthroplasty is deemed medically necessary. The treatment options including medical management, injection therapy arthroscopy and arthroplasty were discussed at length. The risks and benefits of total knee arthroplasty were presented and reviewed. The risks due to aseptic loosening, infection, stiffness, patella tracking problems, thromboembolic complications and other imponderables were discussed. The patient acknowledged the explanation, agreed to proceed with the plan and consent was signed. Patient is being admitted for inpatient treatment for surgery, pain control, PT, OT, prophylactic antibiotics, VTE prophylaxis, progressive ambulation and ADL's and discharge planning. The patient is planning to be discharged home.      Patient's anticipated LOS is less than 2 midnights, meeting these requirements: - Lives within 1 hour of care - Has a competent adult at home to recover with post-op recover - NO history of  - Chronic pain requiring opiods  - Diabetes  - Coronary Artery Disease  - Heart failure  - Heart attack  - Stroke  - DVT/VTE  - Cardiac arrhythmia  - Respiratory Failure/COPD  - Renal failure  -  Anemia  - Advanced Liver disease      [place] Pugh. Kimberly Ishikawa   PA-C  12/02/2019, 9:17 AM

## 2019-12-03 ENCOUNTER — Encounter (HOSPITAL_COMMUNITY)
Admission: RE | Disposition: A | Discharge status: Home / Self Care | Payer: Self-pay | Source: Ambulatory Visit | Attending: Orthopedic Surgery

## 2019-12-03 ENCOUNTER — Ambulatory Visit (HOSPITAL_COMMUNITY): Payer: Medicare Other | Admitting: Physician Assistant

## 2019-12-03 ENCOUNTER — Encounter (HOSPITAL_COMMUNITY): Payer: Self-pay | Admitting: Orthopedic Surgery

## 2019-12-03 ENCOUNTER — Ambulatory Visit (HOSPITAL_COMMUNITY): Payer: Medicare Other | Admitting: Certified Registered Nurse Anesthetist

## 2019-12-03 ENCOUNTER — Observation Stay (HOSPITAL_COMMUNITY)
Admission: RE | Admit: 2019-12-03 | Discharge: 2019-12-04 | Disposition: A | Discharge status: Home / Self Care | Payer: Medicare Other | Source: Ambulatory Visit | Attending: Orthopedic Surgery | Admitting: Orthopedic Surgery

## 2019-12-03 ENCOUNTER — Other Ambulatory Visit: Payer: Self-pay

## 2019-12-03 DIAGNOSIS — M25462 Effusion, left knee: Secondary | ICD-10-CM | POA: Diagnosis not present

## 2019-12-03 DIAGNOSIS — E7800 Pure hypercholesterolemia, unspecified: Secondary | ICD-10-CM | POA: Diagnosis not present

## 2019-12-03 DIAGNOSIS — G629 Polyneuropathy, unspecified: Secondary | ICD-10-CM | POA: Diagnosis not present

## 2019-12-03 DIAGNOSIS — M25762 Osteophyte, left knee: Secondary | ICD-10-CM | POA: Diagnosis not present

## 2019-12-03 DIAGNOSIS — Z96652 Presence of left artificial knee joint: Secondary | ICD-10-CM

## 2019-12-03 DIAGNOSIS — G2581 Restless legs syndrome: Secondary | ICD-10-CM | POA: Diagnosis not present

## 2019-12-03 DIAGNOSIS — Z79899 Other long term (current) drug therapy: Secondary | ICD-10-CM | POA: Diagnosis not present

## 2019-12-03 DIAGNOSIS — G8918 Other acute postprocedural pain: Secondary | ICD-10-CM | POA: Diagnosis not present

## 2019-12-03 DIAGNOSIS — M1712 Unilateral primary osteoarthritis, left knee: Secondary | ICD-10-CM | POA: Diagnosis not present

## 2019-12-03 HISTORY — PX: TOTAL KNEE ARTHROPLASTY: SHX125

## 2019-12-03 LAB — TYPE AND SCREEN
ABO/RH(D): A POS
Antibody Screen: NEGATIVE

## 2019-12-03 SURGERY — ARTHROPLASTY, KNEE, TOTAL
Anesthesia: Spinal | Site: Knee | Laterality: Left

## 2019-12-03 MED ORDER — SIMVASTATIN 40 MG PO TABS
40.0000 mg | ORAL_TABLET | Freq: Every day | ORAL | Status: DC
  Administered 2019-12-03: 40 mg via ORAL
  Filled 2019-12-03: qty 1

## 2019-12-03 MED ORDER — SODIUM CHLORIDE (PF) 0.9 % IJ SOLN
INTRAMUSCULAR | Status: AC
  Filled 2019-12-03: qty 50

## 2019-12-03 MED ORDER — METHOCARBAMOL 500 MG IVPB - SIMPLE MED
500.0000 mg | Freq: Four times a day (QID) | INTRAVENOUS | Status: DC | PRN
  Administered 2019-12-03: 500 mg via INTRAVENOUS
  Filled 2019-12-03: qty 50
  Filled 2019-12-03: qty 500

## 2019-12-03 MED ORDER — CHLORHEXIDINE GLUCONATE 4 % EX LIQD: 60.0000 mL | Freq: Once | CUTANEOUS | Status: DC

## 2019-12-03 MED ORDER — POVIDONE-IODINE 10 % EX SWAB
2.0000 "application " | Freq: Once | CUTANEOUS | Status: AC
  Administered 2019-12-03: 2 via TOPICAL

## 2019-12-03 MED ORDER — TRANEXAMIC ACID-NACL 1000-0.7 MG/100ML-% IV SOLN
1000.0000 mg | INTRAVENOUS | Status: AC
  Administered 2019-12-03: 1000 mg via INTRAVENOUS
  Filled 2019-12-03: qty 100

## 2019-12-03 MED ORDER — FERROUS SULFATE 325 (65 FE) MG PO TABS
325.0000 mg | ORAL_TABLET | Freq: Two times a day (BID) | ORAL | Status: DC
  Administered 2019-12-03 – 2019-12-04 (×2): 325 mg via ORAL
  Filled 2019-12-03 (×2): qty 1

## 2019-12-03 MED ORDER — ONDANSETRON HCL 4 MG/2ML IJ SOLN: 4.0000 mg | Freq: Four times a day (QID) | INTRAMUSCULAR | Status: DC | PRN

## 2019-12-03 MED ORDER — PHENYLEPHRINE 40 MCG/ML (10ML) SYRINGE FOR IV PUSH (FOR BLOOD PRESSURE SUPPORT)
PREFILLED_SYRINGE | INTRAVENOUS | Status: AC
  Filled 2019-12-03: qty 10

## 2019-12-03 MED ORDER — DOCUSATE SODIUM 100 MG PO CAPS
100.0000 mg | ORAL_CAPSULE | Freq: Two times a day (BID) | ORAL | Status: DC
  Administered 2019-12-03 – 2019-12-04 (×2): 100 mg via ORAL
  Filled 2019-12-03 (×2): qty 1

## 2019-12-03 MED ORDER — NORTRIPTYLINE HCL 25 MG PO CAPS
75.0000 mg | ORAL_CAPSULE | Freq: Every day | ORAL | Status: DC
  Administered 2019-12-03: 23:00:00 75 mg via ORAL
  Filled 2019-12-03 (×2): qty 3

## 2019-12-03 MED ORDER — BUPIVACAINE HCL (PF) 0.25 % IJ SOLN
INTRAMUSCULAR | Status: AC
  Filled 2019-12-03: qty 30

## 2019-12-03 MED ORDER — SODIUM CHLORIDE (PF) 0.9 % IJ SOLN
INTRAMUSCULAR | Status: DC | PRN
  Administered 2019-12-03: 30 mL

## 2019-12-03 MED ORDER — BUPIVACAINE HCL 0.25 % IJ SOLN
INTRAMUSCULAR | Status: DC | PRN
  Administered 2019-12-03: 30 mL

## 2019-12-03 MED ORDER — SODIUM CHLORIDE 0.9 % IV SOLN: INTRAVENOUS | Status: DC

## 2019-12-03 MED ORDER — TRANEXAMIC ACID-NACL 1000-0.7 MG/100ML-% IV SOLN
1000.0000 mg | Freq: Once | INTRAVENOUS | Status: AC
  Administered 2019-12-03: 13:00:00 1000 mg via INTRAVENOUS
  Filled 2019-12-03: qty 100

## 2019-12-03 MED ORDER — FENTANYL CITRATE (PF) 100 MCG/2ML IJ SOLN
INTRAMUSCULAR | Status: DC | PRN
  Administered 2019-12-03 (×2): 50 ug via INTRAVENOUS

## 2019-12-03 MED ORDER — GABAPENTIN 300 MG PO CAPS
600.0000 mg | ORAL_CAPSULE | Freq: Every day | ORAL | Status: DC
  Administered 2019-12-03: 23:00:00 600 mg via ORAL
  Filled 2019-12-03: qty 2

## 2019-12-03 MED ORDER — ACETAMINOPHEN 325 MG PO TABS: 325.0000 mg | ORAL_TABLET | Freq: Four times a day (QID) | ORAL | Status: DC | PRN

## 2019-12-03 MED ORDER — ACETAMINOPHEN 500 MG PO TABS
1000.0000 mg | ORAL_TABLET | Freq: Three times a day (TID) | ORAL | Status: DC
  Administered 2019-12-03 – 2019-12-04 (×3): 1000 mg via ORAL
  Filled 2019-12-03 (×3): qty 2

## 2019-12-03 MED ORDER — SODIUM CHLORIDE 0.9 % IR SOLN
Status: DC | PRN
  Administered 2019-12-03: 1000 mL

## 2019-12-03 MED ORDER — ONDANSETRON HCL 4 MG PO TABS: 4.0000 mg | ORAL_TABLET | Freq: Four times a day (QID) | ORAL | Status: DC | PRN

## 2019-12-03 MED ORDER — PROPOFOL 500 MG/50ML IV EMUL
INTRAVENOUS | Status: AC
  Filled 2019-12-03: qty 50

## 2019-12-03 MED ORDER — GABAPENTIN 300 MG PO CAPS
300.0000 mg | ORAL_CAPSULE | Freq: Two times a day (BID) | ORAL | Status: DC
  Administered 2019-12-03 – 2019-12-04 (×3): 300 mg via ORAL
  Filled 2019-12-03 (×3): qty 1

## 2019-12-03 MED ORDER — ONDANSETRON HCL 4 MG/2ML IJ SOLN
INTRAMUSCULAR | Status: DC | PRN
  Administered 2019-12-03: 4 mg via INTRAVENOUS

## 2019-12-03 MED ORDER — FENTANYL CITRATE (PF) 100 MCG/2ML IJ SOLN: 25.0000 ug | INTRAMUSCULAR | Status: DC | PRN

## 2019-12-03 MED ORDER — MAGNESIUM CITRATE PO SOLN: 1.0000 | Freq: Once | ORAL | Status: DC | PRN

## 2019-12-03 MED ORDER — KETOROLAC TROMETHAMINE 30 MG/ML IJ SOLN
INTRAMUSCULAR | Status: AC
  Filled 2019-12-03: qty 1

## 2019-12-03 MED ORDER — DULOXETINE HCL 60 MG PO CPEP
60.0000 mg | ORAL_CAPSULE | Freq: Every day | ORAL | Status: DC
  Administered 2019-12-04: 09:00:00 60 mg via ORAL
  Filled 2019-12-03: qty 1

## 2019-12-03 MED ORDER — PHENOL 1.4 % MT LIQD: 1.0000 | OROMUCOSAL | Status: DC | PRN

## 2019-12-03 MED ORDER — BUPIVACAINE HCL (PF) 0.75 % IJ SOLN
INTRAMUSCULAR | Status: DC | PRN
  Administered 2019-12-03: 1.6 mL via INTRATHECAL

## 2019-12-03 MED ORDER — STERILE WATER FOR IRRIGATION IR SOLN
Status: DC | PRN
  Administered 2019-12-03: 2000 mL

## 2019-12-03 MED ORDER — GENTAMICIN SULFATE 40 MG/ML IJ SOLN
320.0000 mg | INTRAVENOUS | Status: DC
  Administered 2019-12-03: 08:00:00 320 mg via INTRAVENOUS
  Filled 2019-12-03: qty 8

## 2019-12-03 MED ORDER — PROPOFOL 500 MG/50ML IV EMUL
INTRAVENOUS | Status: DC | PRN
  Administered 2019-12-03: 25 ug/kg/min via INTRAVENOUS

## 2019-12-03 MED ORDER — PHENYLEPHRINE HCL (PRESSORS) 10 MG/ML IV SOLN
INTRAVENOUS | Status: AC
  Filled 2019-12-03: qty 1

## 2019-12-03 MED ORDER — LACTATED RINGERS IV SOLN: INTRAVENOUS | Status: DC

## 2019-12-03 MED ORDER — ROPIVACAINE HCL 7.5 MG/ML IJ SOLN
INTRAMUSCULAR | Status: DC | PRN
  Administered 2019-12-03: 20 mL via PERINEURAL

## 2019-12-03 MED ORDER — DEXAMETHASONE SODIUM PHOSPHATE 10 MG/ML IJ SOLN
10.0000 mg | Freq: Once | INTRAMUSCULAR | Status: AC
  Administered 2019-12-04: 09:00:00 10 mg via INTRAVENOUS
  Filled 2019-12-03: qty 1

## 2019-12-03 MED ORDER — DIPHENHYDRAMINE HCL 12.5 MG/5ML PO ELIX: 12.5000 mg | ORAL_SOLUTION | ORAL | Status: DC | PRN

## 2019-12-03 MED ORDER — FENTANYL CITRATE (PF) 100 MCG/2ML IJ SOLN
INTRAMUSCULAR | Status: AC
  Filled 2019-12-03: qty 2

## 2019-12-03 MED ORDER — POLYETHYLENE GLYCOL 3350 17 G PO PACK
17.0000 g | PACK | Freq: Two times a day (BID) | ORAL | Status: DC
  Administered 2019-12-03: 23:00:00 17 g via ORAL
  Filled 2019-12-03: qty 1

## 2019-12-03 MED ORDER — KETOROLAC TROMETHAMINE 30 MG/ML IJ SOLN
INTRAMUSCULAR | Status: DC | PRN
  Administered 2019-12-03: 30 mg

## 2019-12-03 MED ORDER — DEXAMETHASONE SODIUM PHOSPHATE 10 MG/ML IJ SOLN
INTRAMUSCULAR | Status: AC
  Filled 2019-12-03: qty 1

## 2019-12-03 MED ORDER — PRAMIPEXOLE DIHYDROCHLORIDE 0.25 MG PO TABS
0.2500 mg | ORAL_TABLET | Freq: Every day | ORAL | Status: DC
  Administered 2019-12-03: 23:00:00 0.25 mg via ORAL
  Filled 2019-12-03: qty 1

## 2019-12-03 MED ORDER — OXYCODONE HCL 5 MG PO TABS
5.0000 mg | ORAL_TABLET | ORAL | Status: DC | PRN
  Administered 2019-12-03 – 2019-12-04 (×4): 10 mg via ORAL
  Filled 2019-12-03 (×4): qty 2

## 2019-12-03 MED ORDER — PROPOFOL 10 MG/ML IV BOLUS
INTRAVENOUS | Status: DC | PRN
  Administered 2019-12-03: 10 mg via INTRAVENOUS

## 2019-12-03 MED ORDER — VANCOMYCIN HCL IN DEXTROSE 1-5 GM/200ML-% IV SOLN
1000.0000 mg | Freq: Two times a day (BID) | INTRAVENOUS | Status: AC
  Administered 2019-12-03: 20:00:00 1000 mg via INTRAVENOUS
  Filled 2019-12-03: qty 200

## 2019-12-03 MED ORDER — METOCLOPRAMIDE HCL 5 MG PO TABS: 5.0000 mg | ORAL_TABLET | Freq: Three times a day (TID) | ORAL | Status: DC | PRN

## 2019-12-03 MED ORDER — VANCOMYCIN HCL IN DEXTROSE 1-5 GM/200ML-% IV SOLN
1000.0000 mg | INTRAVENOUS | Status: AC
  Administered 2019-12-03: 1000 mg via INTRAVENOUS
  Filled 2019-12-03: qty 200

## 2019-12-03 MED ORDER — PHENYLEPHRINE HCL-NACL 10-0.9 MG/250ML-% IV SOLN
INTRAVENOUS | Status: AC
  Filled 2019-12-03: qty 250

## 2019-12-03 MED ORDER — METOCLOPRAMIDE HCL 5 MG/ML IJ SOLN: 5.0000 mg | Freq: Three times a day (TID) | INTRAMUSCULAR | Status: DC | PRN

## 2019-12-03 MED ORDER — HYDROCODONE-ACETAMINOPHEN 7.5-325 MG PO TABS: 1.0000 | ORAL_TABLET | ORAL | Status: DC | PRN

## 2019-12-03 MED ORDER — PHENYLEPHRINE HCL-NACL 10-0.9 MG/250ML-% IV SOLN
INTRAVENOUS | Status: DC | PRN
  Administered 2019-12-03: 40 ug/min via INTRAVENOUS

## 2019-12-03 MED ORDER — MENTHOL 3 MG MT LOZG: 1.0000 | LOZENGE | OROMUCOSAL | Status: DC | PRN

## 2019-12-03 MED ORDER — METHOCARBAMOL 500 MG PO TABS
500.0000 mg | ORAL_TABLET | Freq: Four times a day (QID) | ORAL | Status: DC | PRN
  Administered 2019-12-04: 09:00:00 500 mg via ORAL
  Filled 2019-12-03: qty 1

## 2019-12-03 MED ORDER — BISACODYL 10 MG RE SUPP: 10.0000 mg | Freq: Every day | RECTAL | Status: DC | PRN

## 2019-12-03 MED ORDER — MORPHINE SULFATE (PF) 2 MG/ML IV SOLN: 0.5000 mg | INTRAVENOUS | Status: DC | PRN

## 2019-12-03 MED ORDER — PHENYLEPHRINE 40 MCG/ML (10ML) SYRINGE FOR IV PUSH (FOR BLOOD PRESSURE SUPPORT)
PREFILLED_SYRINGE | INTRAVENOUS | Status: DC | PRN
  Administered 2019-12-03: 120 ug via INTRAVENOUS

## 2019-12-03 MED ORDER — HYDROCODONE-ACETAMINOPHEN 5-325 MG PO TABS: 1.0000 | ORAL_TABLET | ORAL | Status: DC | PRN

## 2019-12-03 MED ORDER — ALUM & MAG HYDROXIDE-SIMETH 200-200-20 MG/5ML PO SUSP: 15.0000 mL | ORAL | Status: DC | PRN

## 2019-12-03 MED ORDER — ONDANSETRON HCL 4 MG/2ML IJ SOLN: 4.0000 mg | Freq: Once | INTRAMUSCULAR | Status: DC | PRN

## 2019-12-03 MED ORDER — DEXAMETHASONE SODIUM PHOSPHATE 10 MG/ML IJ SOLN
10.0000 mg | Freq: Once | INTRAMUSCULAR | Status: AC
  Administered 2019-12-03: 10 mg via INTRAVENOUS

## 2019-12-03 MED ORDER — TRAZODONE HCL 50 MG PO TABS
50.0000 mg | ORAL_TABLET | Freq: Every day | ORAL | Status: DC
  Administered 2019-12-03: 23:00:00 50 mg via ORAL
  Filled 2019-12-03: qty 1

## 2019-12-03 MED ORDER — ASPIRIN 81 MG PO CHEW
81.0000 mg | CHEWABLE_TABLET | Freq: Two times a day (BID) | ORAL | Status: DC
  Administered 2019-12-03 – 2019-12-04 (×2): 81 mg via ORAL
  Filled 2019-12-03 (×2): qty 1

## 2019-12-03 MED ORDER — ONDANSETRON HCL 4 MG/2ML IJ SOLN
INTRAMUSCULAR | Status: AC
  Filled 2019-12-03: qty 2

## 2019-12-03 SURGICAL SUPPLY — 62 items
ATTUNE MED ANAT PAT 32 KNEE (Knees) ×1 IMPLANT
ATTUNE MED ANAT PAT 32MM KNEE (Knees) ×1 IMPLANT
ATTUNE PSFEM LTSZ3 NARCEM KNEE (Femur) ×2 IMPLANT
ATTUNE PSRP INSR SZ3 6 KNEE (Insert) ×1 IMPLANT
ATTUNE PSRP INSR SZ3 6MM KNEE (Insert) ×1 IMPLANT
BAG ZIPLOCK 12X15 (MISCELLANEOUS) IMPLANT
BASE TIBIAL ROT PLAT SZ 3 KNEE (Knees) IMPLANT
BLADE SAW SGTL 11.0X1.19X90.0M (BLADE) ×2 IMPLANT
BLADE SAW SGTL 13.0X1.19X90.0M (BLADE) ×3 IMPLANT
BLADE SURG SZ10 CARB STEEL (BLADE) ×6 IMPLANT
BNDG ELASTIC 6X5.8 VLCR STR LF (GAUZE/BANDAGES/DRESSINGS) ×3 IMPLANT
BOWL SMART MIX CTS (DISPOSABLE) ×3 IMPLANT
CEMENT HV SMART SET (Cement) ×4 IMPLANT
COVER SURGICAL LIGHT HANDLE (MISCELLANEOUS) ×3 IMPLANT
COVER WAND RF STERILE (DRAPES) IMPLANT
CUFF TOURN SGL QUICK 34 (TOURNIQUET CUFF) ×2
CUFF TRNQT CYL 34X4.125X (TOURNIQUET CUFF) ×1 IMPLANT
DECANTER SPIKE VIAL GLASS SM (MISCELLANEOUS) ×6 IMPLANT
DERMABOND ADVANCED (GAUZE/BANDAGES/DRESSINGS) ×2
DERMABOND ADVANCED .7 DNX12 (GAUZE/BANDAGES/DRESSINGS) ×1 IMPLANT
DRAPE U-SHAPE 47X51 STRL (DRAPES) ×3 IMPLANT
DRESSING AQUACEL AG SP 3.5X10 (GAUZE/BANDAGES/DRESSINGS) ×1 IMPLANT
DRSG AQUACEL AG SP 3.5X10 (GAUZE/BANDAGES/DRESSINGS) ×3
DURAPREP 26ML APPLICATOR (WOUND CARE) ×6 IMPLANT
ELECT REM PT RETURN 15FT ADLT (MISCELLANEOUS) ×3 IMPLANT
GLOVE BIO SURGEON STRL SZ 6 (GLOVE) ×3 IMPLANT
GLOVE BIOGEL PI IND STRL 6.5 (GLOVE) ×1 IMPLANT
GLOVE BIOGEL PI IND STRL 7.5 (GLOVE) ×1 IMPLANT
GLOVE BIOGEL PI IND STRL 8.5 (GLOVE) ×1 IMPLANT
GLOVE BIOGEL PI INDICATOR 6.5 (GLOVE) ×2
GLOVE BIOGEL PI INDICATOR 7.5 (GLOVE) ×2
GLOVE BIOGEL PI INDICATOR 8.5 (GLOVE) ×2
GLOVE ECLIPSE 8.0 STRL XLNG CF (GLOVE) ×3 IMPLANT
GLOVE ORTHO TXT STRL SZ7.5 (GLOVE) ×3 IMPLANT
GOWN STRL REUS W/ TWL LRG LVL3 (GOWN DISPOSABLE) ×1 IMPLANT
GOWN STRL REUS W/TWL 2XL LVL3 (GOWN DISPOSABLE) ×3 IMPLANT
GOWN STRL REUS W/TWL LRG LVL3 (GOWN DISPOSABLE) ×5 IMPLANT
HANDPIECE INTERPULSE COAX TIP (DISPOSABLE) ×2
HOLDER FOLEY CATH W/STRAP (MISCELLANEOUS) IMPLANT
KIT TURNOVER KIT A (KITS) IMPLANT
MANIFOLD NEPTUNE II (INSTRUMENTS) ×3 IMPLANT
NDL SAFETY ECLIPSE 18X1.5 (NEEDLE) IMPLANT
NEEDLE HYPO 18GX1.5 SHARP (NEEDLE)
NS IRRIG 1000ML POUR BTL (IV SOLUTION) ×3 IMPLANT
PACK TOTAL KNEE CUSTOM (KITS) ×3 IMPLANT
PENCIL SMOKE EVACUATOR (MISCELLANEOUS) IMPLANT
PIN DRILL FIX HALF THREAD (BIT) ×2 IMPLANT
PIN FIX SIGMA LCS THRD HI (PIN) ×2 IMPLANT
PROTECTOR NERVE ULNAR (MISCELLANEOUS) ×3 IMPLANT
SET HNDPC FAN SPRY TIP SCT (DISPOSABLE) ×1 IMPLANT
SET PAD KNEE POSITIONER (MISCELLANEOUS) ×3 IMPLANT
SUT MNCRL AB 4-0 PS2 18 (SUTURE) ×3 IMPLANT
SUT STRATAFIX PDS+ 0 24IN (SUTURE) ×3 IMPLANT
SUT VIC AB 1 CT1 36 (SUTURE) ×3 IMPLANT
SUT VIC AB 2-0 CT1 27 (SUTURE) ×6
SUT VIC AB 2-0 CT1 TAPERPNT 27 (SUTURE) ×3 IMPLANT
SYR 3ML LL SCALE MARK (SYRINGE) ×3 IMPLANT
TIBIAL BASE ROT PLAT SZ 3 KNEE (Knees) ×3 IMPLANT
TRAY FOLEY MTR SLVR 14FR STAT (SET/KITS/TRAYS/PACK) ×2 IMPLANT
WATER STERILE IRR 1000ML POUR (IV SOLUTION) ×6 IMPLANT
WRAP KNEE MAXI GEL POST OP (GAUZE/BANDAGES/DRESSINGS) ×3 IMPLANT
YANKAUER SUCT BULB TIP 10FT TU (MISCELLANEOUS) ×3 IMPLANT

## 2019-12-03 NOTE — Care Plan (Signed)
Ortho Bundle Case Management Note  Patient Details  Name: Kimberly [name] MRN: [id number] Date of Birth: Sep 19, 1940  L TKA on 03-03-20  DCP: Home with dtr. 1 story home with 0 ste.  DME: RW ordered through [facility]. Does not need a 3-in-1, has a handicap toilet.  PT: EO. PT eval scheduled on 12-06-19.                  DME Arranged:  Gilford Rile rolling DME Agency:  [employer]  HH Arranged:    [place] Agency:     Additional Comments: Please contact me with any questions of if this plan should need to change.  Marianne Sofia, RN,CCM [facility]  [phone] 12/03/2019, 10:41 AM

## 2019-12-03 NOTE — Anesthesia Procedure Notes (Signed)
Spinal  Patient location during procedure: OR Start time: 12/03/2019 7:16 AM End time: 12/03/2019 7:20 AM Staffing Performed: anesthesiologist  Anesthesiologist: Josephine Igo, MD Preanesthetic Checklist Completed: patient identified, IV checked, site marked, risks and benefits discussed, surgical consent, monitors and equipment checked, pre-op evaluation and timeout performed Spinal Block Patient position: sitting Prep: DuraPrep and site prepped and draped Patient monitoring: heart rate, cardiac monitor, continuous pulse ox and blood pressure Approach: midline Location: L3-4 Injection technique: single-shot Needle Needle type: Sprotte  Needle gauge: 24 G Needle length: 9 cm Assessment Sensory level: T4 Additional Notes Patient tolerated procedure well. Adequate sensory level.

## 2019-12-03 NOTE — Anesthesia Postprocedure Evaluation (Signed)
Anesthesia Post Note  Patient: Kimberly [name]  Procedure(s) Performed: TOTAL KNEE ARTHROPLASTY (Left Knee)     Patient location during evaluation: PACU Anesthesia Type: Spinal Level of consciousness: oriented and awake and alert Pain management: pain level controlled Vital Signs Assessment: post-procedure vital signs reviewed and stable Respiratory status: spontaneous breathing, respiratory function stable and nonlabored ventilation Cardiovascular status: blood pressure returned to baseline and stable Postop Assessment: no headache, no backache, no apparent nausea or vomiting, spinal receding and patient able to bend at knees Anesthetic complications: no    Last Vitals:  Vitals:   12/03/19 1000 12/03/19 1012  BP: (!) 154/76 (!) 146/81  Pulse: 100 100  Resp: 11 14  Temp: 36.5 C (!) 36.3 C  SpO2: 100% 99%    Last Pain:  Vitals:   12/03/19 1012  TempSrc: Oral  PainSc: 0-No pain                 Kimberly Hausner A.

## 2019-12-03 NOTE — Transfer of Care (Signed)
Immediate Anesthesia Transfer of Care Note  Patient: Kimberly [name]  Procedure(s) Performed: TOTAL KNEE ARTHROPLASTY (Left Knee)  Patient Location: PACU  Anesthesia Type:Spinal  Level of Consciousness: awake, alert , oriented and patient cooperative  Airway & Oxygen Therapy: Patient Spontanous Breathing and Patient connected to face mask  Post-op Assessment: Report given to RN and Post -op Vital signs reviewed and stable  Post vital signs: Reviewed and stable  Last Vitals:  Vitals Value Taken Time  BP 130/68 12/03/19 0904  Temp    Pulse 104 12/03/19 0906  Resp 15 12/03/19 0906  SpO2 100 % 12/03/19 0906  Vitals shown include unvalidated device data.  Last Pain:  Vitals:   12/03/19 0624  TempSrc: Oral      Patients Stated Pain Goal: 3 ([id number])  Complications: No apparent anesthesia complications

## 2019-12-03 NOTE — Evaluation (Addendum)
Physical Therapy Evaluation Patient Details Name: Kimberly [name] MRN: [id number] DOB: 08-07-1941 Today's Date: 12/03/2019   History of Present Illness  79 yo female s/p L TKA 12/03/19. Hx of neuropathy, chronic LBP  Clinical Impression  On eval POD 0, pt was Min assist for mobility. She walked ~75 feet with a RW. Moderate pain with activity. Anticipate pt will progress well during stay. Plan is possibly for d/c home on tomorrow. Will progress activity as tolerated.     Follow Up Recommendations Follow surgeon's recommendation for DC plan and follow-up therapies    Equipment Recommendations  Rolling walker with 5" wheels    Recommendations for Other Services       Precautions / Restrictions Precautions Precautions: Fall Restrictions Weight Bearing Restrictions: No      Mobility  Bed Mobility Overal bed mobility: Needs Assistance Bed Mobility: Supine to Sit     Supine to sit: Min guard;HOB elevated     General bed mobility comments: close guard for safety. vcs safety, hand placement.  Transfers Overall transfer level: Needs assistance Equipment used: Rolling walker (2 wheeled) Transfers: Sit to/from Stand Sit to Stand: Min assist         General transfer comment: Assist to rise, steady, control descent. VCs safety, technique, hand placement.  Ambulation/Gait Ambulation/Gait assistance: Min assist Gait Distance (Feet): 75 Feet Assistive device: Rolling walker (2 wheeled) Gait Pattern/deviations: Step-to pattern     General Gait Details: VCs safety, technique, sequence. Assist to steady.  Stairs            Wheelchair Mobility    Modified Rankin (Stroke Patients Only)       Balance Overall balance assessment: Needs assistance         Standing balance support: Bilateral upper extremity supported Standing balance-Leahy Scale: Poor                               Pertinent Vitals/Pain Pain Assessment: 0-10 Pain Score: 6  Pain  Location: L knee Pain Descriptors / Indicators: Aching;Sore Pain Intervention(s): Limited activity within patient's tolerance;Monitored during session;Ice applied    Home Living Family/patient expects to be discharged to:: Private residence Living Arrangements: Alone Available Help at Discharge: Family Type of Home: House Home Access: Level entry     Home Layout: One level Home Equipment: None      Prior Function Level of Independence: Independent               Hand Dominance        Extremity/Trunk Assessment   Upper Extremity Assessment Upper Extremity Assessment: Overall WFL for tasks assessed    Lower Extremity Assessment Lower Extremity Assessment: Generalized weakness    Cervical / Trunk Assessment Cervical / Trunk Assessment: Normal  Communication   Communication: No difficulties  Cognition Arousal/Alertness: Awake/alert Behavior During Therapy: WFL for tasks assessed/performed Overall Cognitive Status: Within Functional Limits for tasks assessed                                        General Comments      Exercises  Ankle Pumps, 10 reps, both Quad sets, 10 reps, both Heel slides, 10 reps, both (~30-40 degrees)   Assessment/Plan    PT Assessment Patient needs continued PT services  PT Problem List Decreased strength;Decreased range of motion;Decreased mobility;Decreased activity tolerance;Decreased balance;Decreased knowledge of  use of DME;Pain       PT Treatment Interventions DME instruction;Gait training;Therapeutic activities;Therapeutic exercise;Patient/family education;Stair training;Balance training;Functional mobility training    PT Goals (Current goals can be found in the Care Plan section)  Acute Rehab PT Goals Patient Stated Goal: home. regain independence PT Goal Formulation: With patient Time For Goal Achievement: 12/17/19 Potential to Achieve Goals: Good    Frequency 7X/week   Barriers to discharge         Co-evaluation               AM-PAC PT "6 Clicks" Mobility  Outcome Measure Help needed turning from your back to your side while in a flat bed without using bedrails?: A Little Help needed moving from lying on your back to sitting on the side of a flat bed without using bedrails?: A Little Help needed moving to and from a bed to a chair (including a wheelchair)?: A Little Help needed standing up from a chair using your arms (e.g., wheelchair or bedside chair)?: A Little Help needed to walk in hospital room?: A Little Help needed climbing 3-5 steps with a railing? : A Little 6 Click Score: 18    End of Session Equipment Utilized During Treatment: Gait belt Activity Tolerance: Patient tolerated treatment well Patient left: in chair;with call bell/phone within reach;with chair alarm set   PT Visit Diagnosis: Pain;Other abnormalities of gait and mobility (R26.89) Pain - Right/Left: Left Pain - part of body: Knee    Time: [id number] PT Time Calculation (min) (ACUTE ONLY): 14 min   Charges:   PT Evaluation $PT Eval Low Complexity: 1 Low            Creston Klas P, PT Acute Rehabilitation

## 2019-12-03 NOTE — Anesthesia Procedure Notes (Signed)
Procedure Name: MAC Date/Time: 12/03/2019 7:20 AM Performed by: Claudia Desanctis, CRNA Pre-anesthesia Checklist: Patient identified, Emergency Drugs available, Suction available and Patient being monitored Patient Re-evaluated:Patient Re-evaluated prior to induction Oxygen Delivery Method: Simple face mask

## 2019-12-03 NOTE — Anesthesia Procedure Notes (Signed)
Anesthesia Regional Block: Adductor canal block   Pre-Anesthetic Checklist: ,, timeout performed, Correct Patient, Correct Site, Correct Laterality, Correct Procedure, Correct Position, site marked, Risks and benefits discussed,  Surgical consent,  Pre-op evaluation,  At surgeon's request and post-op pain management  Laterality: Left  Prep: chloraprep       Needles:  Injection technique: Single-shot  Needle Type: Echogenic Stimulator Needle     Needle Length: 9cm  Needle Gauge: 21   Needle insertion depth: 7 cm   Additional Needles:   Procedures:,,,, ultrasound used (permanent image in chart),,,,  Narrative:  Start time: 12/03/2019 6:57 AM End time: 12/03/2019 7:02 AM Injection made incrementally with aspirations every 5 mL.  Performed by: Personally  Anesthesiologist: Josephine Igo, MD  Additional Notes: Timeout performed. Patient sedated. Relevant anatomy ID'd using [place]. Incremental 2-30ml injection of LA with frequent aspiration. Patient tolerated procedure well.        Left Adductor Canal Block

## 2019-12-03 NOTE — Op Note (Signed)
NAME:  Kimberly [name] RECORD NO.:  [id number]                             FACILITY:  [facility]      PHYSICIAN:  Pietro Cassis. Alvan Dame, M.D.  DATE OF BIRTH:  Sep 20, 1940      DATE OF PROCEDURE:  12/03/2019                                     OPERATIVE REPORT         PREOPERATIVE DIAGNOSIS:  Left knee osteoarthritis.      POSTOPERATIVE DIAGNOSIS:  Left knee osteoarthritis.      FINDINGS:  The patient was noted to have complete loss of cartilage and   bone-on-bone arthritis with associated osteophytes in the medial and patellofemoral compartments of   the knee with a significant synovitis and associated effusion.  The patient had failed months of conservative treatment including medications, injection therapy, activity modification.     PROCEDURE:  Left total knee replacement.      COMPONENTS USED:  DePuy Attune rotating platform posterior stabilized knee   system, a size 3N femur, 3 tibia, size 6 mm PS AOX insert, and 32 anatomic patellar   button.      SURGEON:  Pietro Cassis. Alvan Dame, M.D.      ASSISTANT:  Griffith Citron, PA-C.      ANESTHESIA:  Regional and Spinal.      SPECIMENS:  None.      COMPLICATION:  None.      DRAINS:  None.  EBL: <100cc      TOURNIQUET TIME:   Total Tourniquet Time Documented: Thigh (Left) - 29 minutes Total: Thigh (Left) - 29 minutes  .      The patient was stable to the recovery room.      INDICATION FOR PROCEDURE:  Kimberly [name] is a 79 y.o. female patient of   mine.  The patient had been seen, evaluated, and treated for months conservatively in the   office with medication, activity modification, and injections.  The patient had   radiographic changes of bone-on-bone arthritis with endplate sclerosis and osteophytes noted.  Based on the radiographic changes and failed conservative measures, the patient   decided to proceed with definitive treatment, total knee replacement.  Risks of infection, DVT, component  failure, need for revision surgery, neurovascular injury were reviewed in the office setting.  The postop course was reviewed stressing the efforts to maximize post-operative satisfaction and function.  Consent was obtained for benefit of pain   relief.      PROCEDURE IN DETAIL:  The patient was brought to the operative theater.   Once adequate anesthesia, preoperative antibiotics, 1 gm of Vancomycin and Gentamycin,1 gm of Tranexamic Acid, and 10 mg of Decadron administered, the patient was positioned supine with a left thigh tourniquet placed.  The  left lower extremity was prepped and draped in sterile fashion.  A time-   out was performed identifying the patient, planned procedure, and the appropriate extremity.      The left lower extremity was placed in the [facility] leg holder.  The leg was   exsanguinated, tourniquet elevated to 250 mmHg.  A midline incision was  made followed by median parapatellar arthrotomy.  Following initial   exposure, attention was first directed to the patella.  Precut   measurement was noted to be 22 mm.  I resected down to 13-14 mm and used a   32 anatomic patellar button to restore patellar height as well as cover the cut surface.      The lug holes were drilled and a metal shim was placed to protect the   patella from retractors and saw blade during the procedure.      At this point, attention was now directed to the femur.  The femoral   canal was opened with a drill, irrigated to try to prevent fat emboli.  An   intramedullary rod was passed at 3 degrees valgus, 9 mm of bone was   resected off the distal femur.  Following this resection, the tibia was   subluxated anteriorly.  Using the extramedullary guide, 2 mm of bone was resected off   the proximal medial tibia.  We confirmed the gap would be   stable medially and laterally with a size 5 spacer block as well as confirmed that the tibial cut was perpendicular in the coronal plane, checking with an  alignment rod.      Once this was done, I sized the femur to be a size 3 in the anterior-   posterior dimension, chose a narrow component based on medial and   lateral dimension.  The size 3 rotation block was then pinned in   position anterior referenced using the C-clamp to set rotation.  The   anterior, posterior, and  chamfer cuts were made without difficulty nor   notching making certain that I was along the anterior cortex to help   with flexion gap stability.      The final box cut was made off the lateral aspect of distal femur.      At this point, the tibia was sized to be a size 3.  The size 3 tray was   then pinned in position through the medial third of the tubercle,   drilled, and keel punched.  Trial reduction was now carried with a 3 femur,  3 tibia, a size 6 mm PS insert, and the 32 anatomic patella botton.  The knee was brought to full extension with good flexion stability with the patella   tracking through the trochlea without application of pressure.  Given   all these findings the trial components removed.  Final components were   opened and cement was mixed.  The knee was irrigated with normal saline solution and pulse lavage.  The synovial lining was   then injected with 30 cc of 0.25% Marcaine with epinephrine, 1 cc of Toradol and 30 cc of NS for a total of 61 cc.     Final implants were then cemented onto cleaned and dried cut surfaces of bone with the knee brought to extension with a size 6 mm PS trial insert.      Once the cement had fully cured, excess cement was removed   throughout the knee.  I confirmed that I was satisfied with the range of   motion and stability, and the final size 6 mm PS AOX insert was chosen.  It was   placed into the knee.      The tourniquet had been let down at 29 minutes.  No significant   hemostasis was required.  The extensor mechanism was then reapproximated using #1 Vicryl  and #1 Stratafix sutures with the knee   in flexion.   The   remaining wound was closed with 2-0 Vicryl and running 4-0 Monocryl.   The knee was cleaned, dried, dressed sterilely using Dermabond and   Aquacel dressing.  The patient was then   brought to recovery room in stable condition, tolerating the procedure   well.   Please note that Physician Assistant, Griffith Citron, PA-C was present for the entirety of the case, and was utilized for pre-operative positioning, peri-operative retractor management, general facilitation of the procedure and for primary wound closure at the end of the case.              Pietro Cassis Alvan Dame, M.D.    12/03/2019 8:33 AM

## 2019-12-04 ENCOUNTER — Encounter: Payer: Self-pay | Admitting: *Deleted

## 2019-12-04 DIAGNOSIS — Z79899 Other long term (current) drug therapy: Secondary | ICD-10-CM | POA: Diagnosis not present

## 2019-12-04 DIAGNOSIS — G2581 Restless legs syndrome: Secondary | ICD-10-CM | POA: Diagnosis not present

## 2019-12-04 DIAGNOSIS — M1712 Unilateral primary osteoarthritis, left knee: Secondary | ICD-10-CM | POA: Diagnosis not present

## 2019-12-04 DIAGNOSIS — E7800 Pure hypercholesterolemia, unspecified: Secondary | ICD-10-CM | POA: Diagnosis not present

## 2019-12-04 DIAGNOSIS — G629 Polyneuropathy, unspecified: Secondary | ICD-10-CM | POA: Diagnosis not present

## 2019-12-04 LAB — CBC
HCT: 38.3 % (ref 36.0–46.0)
Hemoglobin: 12.1 g/dL (ref 12.0–15.0)
MCH: 30.3 pg (ref 26.0–34.0)
MCHC: 31.6 g/dL (ref 30.0–36.0)
MCV: 95.8 fL (ref 80.0–100.0)
Platelets: 252 10*3/uL (ref 150–400)
RBC: 4 MIL/uL (ref 3.87–5.11)
RDW: 12.5 % (ref 11.5–15.5)
WBC: 11.7 10*3/uL — ABNORMAL HIGH (ref 4.0–10.5)
nRBC: 0 % (ref 0.0–0.2)

## 2019-12-04 LAB — BASIC METABOLIC PANEL
Anion gap: 5 (ref 5–15)
BUN: 12 mg/dL (ref 8–23)
CO2: 30 mmol/L (ref 22–32)
Calcium: 8.8 mg/dL — ABNORMAL LOW (ref 8.9–10.3)
Chloride: 106 mmol/L (ref 98–111)
Creatinine, Ser: 0.52 mg/dL (ref 0.44–1.00)
GFR calc Af Amer: >60 mL/min (ref >60)
GFR calc non Af Amer: >60 mL/min (ref >60)
Glucose, Bld: 153 mg/dL — ABNORMAL HIGH (ref 70–99)
Potassium: 4.7 mmol/L (ref 3.5–5.1)
Sodium: 141 mmol/L (ref 135–145)

## 2019-12-04 MED ORDER — METHOCARBAMOL 500 MG PO TABS: 500.0000 mg | ORAL_TABLET | Freq: Four times a day (QID) | ORAL | 0 refills | Status: DC | PRN

## 2019-12-04 MED ORDER — OXYCODONE HCL 5 MG PO TABS: 5.0000 mg | ORAL_TABLET | Freq: Four times a day (QID) | ORAL | 0 refills | Status: DC | PRN

## 2019-12-04 MED ORDER — FERROUS SULFATE 325 (65 FE) MG PO TABS: 325.0000 mg | ORAL_TABLET | Freq: Three times a day (TID) | ORAL | 0 refills | Status: DC

## 2019-12-04 MED ORDER — POLYETHYLENE GLYCOL 3350 17 G PO PACK: 17.0000 g | PACK | Freq: Two times a day (BID) | ORAL | 0 refills | Status: DC

## 2019-12-04 MED ORDER — DOCUSATE SODIUM 100 MG PO CAPS: 100.0000 mg | ORAL_CAPSULE | Freq: Two times a day (BID) | ORAL | 0 refills | Status: DC

## 2019-12-04 MED ORDER — ASPIRIN 81 MG PO CHEW: 81.0000 mg | CHEWABLE_TABLET | Freq: Two times a day (BID) | ORAL | 0 refills | Status: AC

## 2019-12-04 MED ORDER — ACETAMINOPHEN 500 MG PO TABS: 1000.0000 mg | ORAL_TABLET | Freq: Three times a day (TID) | ORAL | 0 refills | Status: DC

## 2019-12-04 NOTE — Progress Notes (Addendum)
Physical Therapy Treatment Patient Details Name: Kimberly [name] MRN: [id number] DOB: Jan 18, 1941 Today's Date: 12/04/2019    History of Present Illness 79 yo female s/p L TKA 12/03/19. Hx of neuropathy, chronic LBP    PT Comments    Progressing well with mobility. Reviewed gait training and exercises. Issued HEP for pt to perform 2x/day until OP PT begins. All education completed. Okay to d/c from PT standpoint.    Follow Up Recommendations  Follow surgeon's recommendation for DC plan and follow-up therapies     Equipment Recommendations       Recommendations for Other Services       Precautions / Restrictions Precautions Precautions: Fall Restrictions Weight Bearing Restrictions: No Other Position/Activity Restrictions: WBAT    Mobility  Bed Mobility               General bed mobility comments: oob in recliner  Transfers Overall transfer level: Needs assistance Equipment used: Rolling walker (2 wheeled) Transfers: Sit to/from Stand Sit to Stand: Min guard         General transfer comment: for safety.  Ambulation/Gait Ambulation/Gait assistance: Min guard Gait Distance (Feet): 135 Feet Assistive device: Rolling walker (2 wheeled) Gait Pattern/deviations: Step-through pattern;Decreased stride length     General Gait Details: for safety. slow but steady   Marine scientist Rankin (Stroke Patients Only)       Balance Overall balance assessment: Mild deficits observed, not formally tested         Standing balance support: Bilateral upper extremity supported Standing balance-Leahy Scale: Fair                              Cognition Arousal/Alertness: Awake/alert Behavior During Therapy: WFL for tasks assessed/performed Overall Cognitive Status: Within Functional Limits for tasks assessed                                        Exercises Total Joint Exercises Ankle  Circles/Pumps: AROM;10 reps;Both Quad Sets: AROM;Both;10 reps Heel Slides: AAROM;Left;10 reps Hip ABduction/ADduction: AROM;Left;10 reps Straight Leg Raises: AROM;Left;10 reps Goniometric ROM: ~5-80 degrees    General Comments        Pertinent Vitals/Pain Pain Assessment: 0-10 Pain Score: 7  Pain Location: L knee Pain Descriptors / Indicators: Aching;Sore Pain Intervention(s): Limited activity within patient's tolerance;Monitored during session;Ice applied;Repositioned    Home Living                      Prior Function            PT Goals (current goals can now be found in the care plan section) Progress towards PT goals: Progressing toward goals    Frequency           PT Plan Current plan remains appropriate    Co-evaluation              AM-PAC PT "6 Clicks" Mobility   Outcome Measure  Help needed turning from your back to your side while in a flat bed without using bedrails?: A Little Help needed moving from lying on your back to sitting on the side of a flat bed without using bedrails?: A Little Help needed moving to and from a bed to a chair (including a  wheelchair)?: A Little Help needed standing up from a chair using your arms (e.g., wheelchair or bedside chair)?: A Little Help needed to walk in hospital room?: A Little Help needed climbing 3-5 steps with a railing? : A Little 6 Click Score: 18    End of Session Equipment Utilized During Treatment: Gait belt Activity Tolerance: Patient tolerated treatment well Patient left: in chair;with call bell/phone within reach;with chair alarm set   PT Visit Diagnosis: Pain;Other abnormalities of gait and mobility (R26.89) Pain - Right/Left: Left Pain - part of body: Knee     Time: [id number] PT Time Calculation (min) (ACUTE ONLY): 14 min  Charges:  $Gait Training: 8-22 mins                         Doreatha Massed, PT Acute Rehabilitation

## 2019-12-04 NOTE — Progress Notes (Signed)
     Subjective: 1 Day Post-Op Procedure(s) (LRB): TOTAL KNEE ARTHROPLASTY (Left)   Patient reports pain as mild, pain controlled with medication.  No reported events throughout the night. Dr. Alvan Dame discussed the procedure, findings and expectations moving forward.  Ready to be discharged home, if she does well with PT.   Patient's anticipated LOS is less than 2 midnights, meeting these requirements: - Younger than 73 - Lives within 1 hour of care - Has a competent adult at home to recover with post-op recover - NO history of  - Chronic pain requiring opiods  - Diabetes  - Coronary Artery Disease  - Heart failure  - Heart attack  - Stroke  - DVT/VTE  - Cardiac arrhythmia  - Respiratory Failure/COPD  - Renal failure  - Anemia  - Advanced Liver disease     Objective:   VITALS:   Vitals:   12/04/19 0155 12/04/19 0525  BP: 139/87 133/73  Pulse: 95 90  Resp: 17 16  Temp: 97.6 F (36.4 C) 97.8 F (36.6 C)  SpO2: 91% 94%    Dorsiflexion/Plantar flexion intact Incision: dressing C/D/I No cellulitis present Compartment soft  LABS Recent Labs    12/04/19 0313  HGB 12.1  HCT 38.3  WBC 11.7*  PLT 252    Recent Labs    12/04/19 0313  NA 141  K 4.7  BUN 12  CREATININE 0.52  GLUCOSE 153*     Assessment/Plan: 1 Day Post-Op Procedure(s) (LRB): TOTAL KNEE ARTHROPLASTY (Left) Foley cath d/c'ed Advance diet Up with therapy D/C IV fluids Discharge home Follow up in 2 weeks at [facility] Follow up with OLIN,Jalyric Kaestner D in 2 weeks.  Contact information:  [facility] [address] [place] [id number]    Overweight (BMI 25-29.9) Estimated body mass index is 29.56 kg/m as calculated from the following:   Height as of this encounter: 5\' 4"  (1.626 m).   Weight as of this encounter: 78.1 kg. Patient also counseled that weight may inhibit the healing process Patient counseled that losing weight will help with future  health issues       Danae Orleans PA-C  [facility]  [place] [address], [place], [place] [address] Phone: [phone] [email or web address] Facebook  [employer]

## 2019-12-04 NOTE — Plan of Care (Signed)
All discharge instructions were given to the Pt. All questions were answered.

## 2019-12-04 NOTE — Discharge Instructions (Signed)

## 2019-12-04 NOTE — TOC Progression Note (Signed)
Transition of Care [facility] - Progression Note    Patient Details  Name: Kimberly [name] MRN: [id number] Date of Birth: 1941-04-09  Transition of Care [facility]) CM/SW [place], [place] Phone Number: 12/04/2019, 9:52 AM  Clinical Narrative:    Therapy Plan: OPPT RW delivered to the patient bedside   Expected Discharge Plan: OP Rehab    Expected Discharge Plan and Services Expected Discharge Plan: OP Rehab         Expected Discharge Date: 12/04/19               DME Arranged: Gilford Rile rolling DME Agency: [place]                   Social Determinants of Health (SDOH) Interventions    Readmission Risk Interventions No flowsheet data found.

## 2019-12-06 DIAGNOSIS — M25562 Pain in left knee: Secondary | ICD-10-CM | POA: Diagnosis not present

## 2019-12-06 DIAGNOSIS — M25662 Stiffness of left knee, not elsewhere classified: Secondary | ICD-10-CM | POA: Diagnosis not present

## 2019-12-10 DIAGNOSIS — M25662 Stiffness of left knee, not elsewhere classified: Secondary | ICD-10-CM | POA: Diagnosis not present

## 2019-12-10 DIAGNOSIS — M25562 Pain in left knee: Secondary | ICD-10-CM | POA: Diagnosis not present

## 2019-12-11 NOTE — Discharge Summary (Signed)
Physician Discharge Summary  Patient ID: Icis Stratis MRN: [id number] DOB/AGE: 1941-05-03 79 y.o.  Admit date: 12/03/2019 Discharge date: 12/04/2019   Procedures:  Procedure(s) (LRB): TOTAL KNEE ARTHROPLASTY (Left)  Attending Physician:  Dr. Paralee Cancel   Admission Diagnoses:   Left knee primary OA / pain  Discharge Diagnoses:  Principal Problem:   S/P left TKA  Past Medical History:  Diagnosis Date  . Complication of anesthesia    Pt reports that she was told "someone had to breath for her" She is not clear what this means  . Hypercholesteremia   . Peripheral neuropathy   . Restless leg syndrome     HPI:    Kimberly [name], 80 y.o. female, has a history of pain and functional disability in the left knee due to arthritis and has failed non-surgical conservative treatments for greater than 12 weeks to includeNSAID's and/or analgesics, corticosteriod injections, viscosupplementation injections and activity modification.  Onset of symptoms was gradual, starting 1 years ago with gradually worsening course since that time. The patient noted no past surgery on the left knee(s).  Patient currently rates pain in the left knee(s) at 8 out of 10 with activity. Patient has worsening of pain with activity and weight bearing, pain that interferes with activities of daily living, pain with passive range of motion, crepitus and joint swelling.  Patient has evidence of periarticular osteophytes and joint space narrowing by imaging studies.  There is no active infection.  Risks, benefits and expectations were discussed with the patient.  Risks including but not limited to the risk of anesthesia, blood clots, nerve damage, blood vessel damage, failure of the prosthesis, infection and up to and including death.  Patient understand the risks, benefits and expectations and wishes to proceed with surgery.   PCP: Jani Gravel, MD   Discharged Condition: good  Hospital Course:  Patient  underwent the above stated procedure on 12/03/2019. Patient tolerated the procedure well and brought to the recovery room in good condition and subsequently to the floor.  POD #1 BP: 133/73 ; Pulse: 90 ; Temp: 97.8 F (36.6 C) ; Resp: 16 Patient reports pain as mild, pain controlled with medication.  No reported events throughout the night. Dr. Alvan Dame discussed the procedure, findings and expectations moving forward.  Ready to be discharged home. Dorsiflexion/plantar flexion intact, incision: dressing C/D/I, no cellulitis present and compartment soft.   LABS  Basename    HGB     12.1  HCT     38.3    Discharge Exam: General appearance: alert, cooperative and no distress Extremities: Homans sign is negative, no sign of DVT, no edema, redness or tenderness in the calves or thighs and no ulcers, gangrene or trophic changes  Disposition: Home with follow up in 2 weeks   Follow-up Information    Paralee Cancel, MD. Go on 12/18/2019.   Specialty: Orthopedic Surgery Why: You are scheduled for post op appointment on Wednesday April 21st at 3:15pm. Contact information: [address] [place] [address] [id number]        Rosilyn Mings.. Go on 12/06/2019.   Why: You are scheduled for physical therapy appointment on Friday April 9th at 10:00am. Contact information: [address] [place] [address] [phone]           Discharge Instructions    Call MD / Call 911   Complete by: As directed    If you experience chest pain or shortness of breath,  CALL 911 and be transported to the hospital emergency room.  If you develope a fever above 101 F, pus (white drainage) or increased drainage or redness at the wound, or calf pain, call your surgeon's office.   Change dressing   Complete by: As directed    Maintain surgical dressing until follow up in the clinic. If the edges start to pull up, may reinforce with tape. If the dressing is no longer  working, may remove and cover with gauze and tape, but must keep the area dry and clean.  Call with any questions or concerns.   Constipation Prevention   Complete by: As directed    Drink plenty of fluids.  Prune juice may be helpful.  You may use a stool softener, such as Colace (over the counter) 100 mg twice a day.  Use MiraLax (over the counter) for constipation as needed.   Diet - low sodium heart healthy   Complete by: As directed    Discharge instructions   Complete by: As directed    Maintain surgical dressing until follow up in the clinic. If the edges start to pull up, may reinforce with tape. If the dressing is no longer working, may remove and cover with gauze and tape, but must keep the area dry and clean.  Follow up in 2 weeks at [facility]. Call with any questions or concerns.   Increase activity slowly as tolerated   Complete by: As directed    Weight bearing as tolerated with assist device (walker, cane, etc) as directed, use it as long as suggested by your surgeon or therapist, typically at least 4-6 weeks.   TED hose   Complete by: As directed    Use stockings (TED hose) for 2 weeks on both leg(s).  You may remove them at night for sleeping.      Allergies as of 12/04/2019      Reactions   Amoxicillin Swelling   Throat felt swollen Did it involve swelling of the face/tongue/throat, SOB, or low BP? Yes Did it involve sudden or severe rash/hives, skin peeling, or any reaction on the inside of your mouth or nose? No Did you need to seek medical attention at a hospital or doctor's office? No When did it last happen?More than 30 years ago If all above answers are "NO", may proceed with cephalosporin use.   Ciprofloxacin Itching   Adhesive [tape] Other (See Comments)   Redness/itching.   Hydrocodone-acetaminophen Itching   Sulfa Antibiotics Rash   And itching      Medication List    TAKE these medications   acetaminophen 500 MG tablet Commonly known as:  TYLENOL Take 2 tablets (1,000 mg total) by mouth every 8 (eight) hours. What changed:   how much to take  when to take this  reasons to take this   aspirin 81 MG chewable tablet Commonly known as: Aspirin Childrens Chew 1 tablet (81 mg total) by mouth 2 (two) times daily. Take for 4 weeks, then resume regular dose.   CALCIUM PO Take 1,200 mg by mouth daily.   CRANBERRY PO Take 1 tablet by mouth daily.   docusate sodium 100 MG capsule Commonly known as: Colace Take 1 capsule (100 mg total) by mouth 2 (two) times daily.   DULoxetine 60 MG capsule Commonly known as: CYMBALTA Take 1 capsule by mouth once daily   ergocalciferol 1.25 MG (50000 UT) capsule Commonly known as: VITAMIN D2 Take 50,000 Units by mouth once a week.  ferrous sulfate 325 (65 FE) MG tablet Commonly known as: FerrouSul Take 1 tablet (325 mg total) by mouth 3 (three) times daily with meals for 14 days.   gabapentin 300 MG capsule Commonly known as: NEURONTIN Take one capsule in morning, one capsule midday, two capsules at bedtime. What changed:   how much to take  how to take this  when to take this   magnesium oxide 400 MG tablet Commonly known as: MAG-OX Take 400 mg by mouth daily.   MEGARED OMEGA-3 KRILL OIL PO Take 1 capsule by mouth once a week.   methocarbamol 500 MG tablet Commonly known as: Robaxin Take 1 tablet (500 mg total) by mouth every 6 (six) hours as needed for muscle spasms.   nortriptyline 25 MG capsule Commonly known as: PAMELOR TAKE 3 CAPSULES BY MOUTH AT BEDTIME   oxyCODONE 5 MG immediate release tablet Commonly known as: Oxy IR/ROXICODONE Take 1-2 tablets (5-10 mg total) by mouth every 6 (six) hours as needed for moderate pain or severe pain.   polyethylene glycol 17 g packet Commonly known as: MIRALAX / GLYCOLAX Take 17 g by mouth 2 (two) times daily.   pramipexole 0.25 MG tablet Commonly known as: MIRAPEX Take 0.25 mg by mouth at bedtime.   simvastatin  40 MG tablet Commonly known as: ZOCOR Take 40 mg by mouth at bedtime.   Systane 0.4-0.3 % Soln Generic drug: Polyethyl Glycol-Propyl Glycol Place 1 drop into both eyes 3 (three) times daily as needed (dry/irritated eyes).   traZODone 50 MG tablet Commonly known as: DESYREL Take 50 mg by mouth at bedtime.            Discharge Care Instructions  (From admission, onward)         Start     Ordered   12/04/19 0000  Change dressing    Comments: Maintain surgical dressing until follow up in the clinic. If the edges start to pull up, may reinforce with tape. If the dressing is no longer working, may remove and cover with gauze and tape, but must keep the area dry and clean.  Call with any questions or concerns.   12/04/19 [id number]           Signed: [place]. Martesha Niedermeier   PA-C  12/11/2019, 9:37 AM

## 2019-12-12 DIAGNOSIS — M25662 Stiffness of left knee, not elsewhere classified: Secondary | ICD-10-CM | POA: Diagnosis not present

## 2019-12-12 DIAGNOSIS — M25562 Pain in left knee: Secondary | ICD-10-CM | POA: Diagnosis not present

## 2019-12-19 DIAGNOSIS — M25662 Stiffness of left knee, not elsewhere classified: Secondary | ICD-10-CM | POA: Diagnosis not present

## 2019-12-19 DIAGNOSIS — M25562 Pain in left knee: Secondary | ICD-10-CM | POA: Diagnosis not present

## 2019-12-23 DIAGNOSIS — M25662 Stiffness of left knee, not elsewhere classified: Secondary | ICD-10-CM | POA: Diagnosis not present

## 2019-12-23 DIAGNOSIS — M25562 Pain in left knee: Secondary | ICD-10-CM | POA: Diagnosis not present

## 2019-12-30 DIAGNOSIS — M25562 Pain in left knee: Secondary | ICD-10-CM | POA: Diagnosis not present

## 2019-12-30 DIAGNOSIS — M25662 Stiffness of left knee, not elsewhere classified: Secondary | ICD-10-CM | POA: Diagnosis not present

## 2020-01-09 DIAGNOSIS — M25662 Stiffness of left knee, not elsewhere classified: Secondary | ICD-10-CM | POA: Diagnosis not present

## 2020-01-09 DIAGNOSIS — M25562 Pain in left knee: Secondary | ICD-10-CM | POA: Diagnosis not present

## 2020-01-16 DIAGNOSIS — Z96652 Presence of left artificial knee joint: Secondary | ICD-10-CM | POA: Diagnosis not present

## 2020-01-16 DIAGNOSIS — Z471 Aftercare following joint replacement surgery: Secondary | ICD-10-CM | POA: Diagnosis not present

## 2020-02-03 DIAGNOSIS — R600 Localized edema: Secondary | ICD-10-CM | POA: Diagnosis not present

## 2020-02-03 DIAGNOSIS — G2581 Restless legs syndrome: Secondary | ICD-10-CM | POA: Diagnosis not present

## 2020-02-03 DIAGNOSIS — G629 Polyneuropathy, unspecified: Secondary | ICD-10-CM | POA: Diagnosis not present

## 2020-02-04 ENCOUNTER — Other Ambulatory Visit (HOSPITAL_COMMUNITY): Payer: Self-pay | Admitting: Internal Medicine

## 2020-02-04 ENCOUNTER — Other Ambulatory Visit: Payer: Self-pay | Admitting: Internal Medicine

## 2020-02-04 DIAGNOSIS — R600 Localized edema: Secondary | ICD-10-CM

## 2020-02-05 ENCOUNTER — Other Ambulatory Visit: Payer: Medicare Other

## 2020-02-07 ENCOUNTER — Other Ambulatory Visit: Payer: Medicare Other

## 2020-02-10 ENCOUNTER — Ambulatory Visit
Admission: RE | Admit: 2020-02-10 | Discharge: 2020-02-10 | Disposition: A | Discharge status: Home / Self Care | Payer: Medicare Other | Source: Ambulatory Visit | Attending: Internal Medicine | Admitting: Internal Medicine

## 2020-02-10 DIAGNOSIS — R600 Localized edema: Secondary | ICD-10-CM | POA: Diagnosis not present

## 2020-04-02 DIAGNOSIS — M159 Polyosteoarthritis, unspecified: Secondary | ICD-10-CM | POA: Diagnosis not present

## 2020-04-02 DIAGNOSIS — M7582 Other shoulder lesions, left shoulder: Secondary | ICD-10-CM | POA: Diagnosis not present

## 2020-04-02 DIAGNOSIS — M19012 Primary osteoarthritis, left shoulder: Secondary | ICD-10-CM | POA: Diagnosis not present

## 2020-04-02 DIAGNOSIS — M1711 Unilateral primary osteoarthritis, right knee: Secondary | ICD-10-CM | POA: Diagnosis not present

## 2020-04-02 DIAGNOSIS — M25512 Pain in left shoulder: Secondary | ICD-10-CM | POA: Diagnosis not present

## 2020-04-02 DIAGNOSIS — M1180 Other specified crystal arthropathies, unspecified site: Secondary | ICD-10-CM | POA: Diagnosis not present

## 2020-04-02 DIAGNOSIS — M1120 Other chondrocalcinosis, unspecified site: Secondary | ICD-10-CM | POA: Diagnosis not present

## 2020-04-17 DIAGNOSIS — E559 Vitamin D deficiency, unspecified: Secondary | ICD-10-CM | POA: Diagnosis not present

## 2020-04-17 DIAGNOSIS — G2581 Restless legs syndrome: Secondary | ICD-10-CM | POA: Diagnosis not present

## 2020-04-17 DIAGNOSIS — G629 Polyneuropathy, unspecified: Secondary | ICD-10-CM | POA: Diagnosis not present

## 2020-04-17 DIAGNOSIS — F5101 Primary insomnia: Secondary | ICD-10-CM | POA: Diagnosis not present

## 2020-04-17 DIAGNOSIS — E7800 Pure hypercholesterolemia, unspecified: Secondary | ICD-10-CM | POA: Diagnosis not present

## 2020-05-07 DIAGNOSIS — B029 Zoster without complications: Secondary | ICD-10-CM | POA: Diagnosis not present

## 2020-05-08 DIAGNOSIS — B0239 Other herpes zoster eye disease: Secondary | ICD-10-CM | POA: Diagnosis not present

## 2020-05-29 DIAGNOSIS — Z8616 Personal history of COVID-19: Secondary | ICD-10-CM

## 2020-05-29 HISTORY — DX: Personal history of COVID-19: Z86.16

## 2020-06-09 DIAGNOSIS — R197 Diarrhea, unspecified: Secondary | ICD-10-CM | POA: Diagnosis not present

## 2020-06-09 DIAGNOSIS — R058 Other specified cough: Secondary | ICD-10-CM | POA: Diagnosis not present

## 2020-06-09 DIAGNOSIS — B349 Viral infection, unspecified: Secondary | ICD-10-CM | POA: Diagnosis not present

## 2020-06-09 DIAGNOSIS — R509 Fever, unspecified: Secondary | ICD-10-CM | POA: Diagnosis not present

## 2020-06-09 DIAGNOSIS — U071 COVID-19: Secondary | ICD-10-CM | POA: Diagnosis not present

## 2020-06-10 ENCOUNTER — Other Ambulatory Visit (HOSPITAL_COMMUNITY): Payer: Self-pay

## 2020-06-10 ENCOUNTER — Other Ambulatory Visit: Payer: Self-pay | Admitting: Internal Medicine

## 2020-06-10 DIAGNOSIS — U071 COVID-19: Secondary | ICD-10-CM

## 2020-06-10 NOTE — Progress Notes (Signed)
I connected by phone with [name] on 06/10/2020 at 12:33 PM to discuss the potential use of a new treatment for mild to moderate COVID-19 viral infection in non-hospitalized patients.  This patient is a 79 y.o. female that meets the FDA criteria for Emergency Use Authorization of COVID monoclonal antibody casirivimab/imdevimab or bamlanivimab/eteseviamb.  Has a (+) direct SARS-CoV-2 viral test result  Has mild or moderate COVID-19   Is NOT hospitalized due to COVID-19  Is within 10 days of symptom onset  Has at least one of the high risk factor(s) for progression to severe COVID-19 and/or hospitalization as defined in EUA.  Specific high risk criteria : Older age (>/= 80 yo)   I have spoken and communicated the following to the patient or parent/caregiver regarding COVID monoclonal antibody treatment:  1. FDA has authorized the emergency use for the treatment of mild to moderate COVID-19 in adults and pediatric patients with positive results of direct SARS-CoV-2 viral testing who are 4 years of age and older weighing at least 40 kg, and who are at high risk for progressing to severe COVID-19 and/or hospitalization.  2. The significant known and potential risks and benefits of COVID monoclonal antibody, and the extent to which such potential risks and benefits are unknown.  3. Information on available alternative treatments and the risks and benefits of those alternatives, including clinical trials.  4. Patients treated with COVID monoclonal antibody should continue to self-isolate and use infection control measures (e.g., wear mask, isolate, social distance, avoid sharing personal items, clean and disinfect "high touch" surfaces, and frequent handwashing) according to CDC guidelines.   5. The patient or parent/caregiver has the option to accept or refuse COVID monoclonal antibody treatment.  After reviewing this information with the patient, the patient has agreed to  receive one of the available covid 19 monoclonal antibodies and will be provided an appropriate fact sheet prior to infusion.  [name], [place]

## 2020-06-11 ENCOUNTER — Ambulatory Visit (HOSPITAL_COMMUNITY)
Admission: RE | Admit: 2020-06-11 | Discharge: 2020-06-11 | Disposition: A | Discharge status: Home / Self Care | Payer: Medicare Other | Source: Ambulatory Visit | Attending: Pulmonary Disease | Admitting: Pulmonary Disease

## 2020-06-11 DIAGNOSIS — Z23 Encounter for immunization: Secondary | ICD-10-CM | POA: Insufficient documentation

## 2020-06-11 DIAGNOSIS — U071 COVID-19: Secondary | ICD-10-CM | POA: Insufficient documentation

## 2020-06-11 MED ORDER — SODIUM CHLORIDE 0.9 % IV SOLN: Freq: Once | INTRAVENOUS | Status: AC

## 2020-06-11 MED ORDER — METHYLPREDNISOLONE SODIUM SUCC 125 MG IJ SOLR: 125.0000 mg | Freq: Once | INTRAMUSCULAR | Status: DC | PRN

## 2020-06-11 MED ORDER — DIPHENHYDRAMINE HCL 50 MG/ML IJ SOLN: 50.0000 mg | Freq: Once | INTRAMUSCULAR | Status: DC | PRN

## 2020-06-11 MED ORDER — FAMOTIDINE IN NACL 20-0.9 MG/50ML-% IV SOLN: 20.0000 mg | Freq: Once | INTRAVENOUS | Status: DC | PRN

## 2020-06-11 MED ORDER — ALBUTEROL SULFATE HFA 108 (90 BASE) MCG/ACT IN AERS: 2.0000 | INHALATION_SPRAY | Freq: Once | RESPIRATORY_TRACT | Status: DC | PRN

## 2020-06-11 MED ORDER — EPINEPHRINE 0.3 MG/0.3ML IJ SOAJ: 0.3000 mg | Freq: Once | INTRAMUSCULAR | Status: DC | PRN

## 2020-06-11 MED ORDER — SODIUM CHLORIDE 0.9 % IV SOLN
INTRAVENOUS | Status: DC | PRN
  Administered 2020-06-11: 1000 mL via INTRAVENOUS

## 2020-06-11 NOTE — Discharge Instructions (Signed)

## 2020-06-11 NOTE — Progress Notes (Signed)
  Diagnosis: COVID-19  Physician: Dr. Joya Gaskins  Procedure: [facility] Med: bamlanivimab\etesevimab infusion - Provided patient with bamlanimivab\etesevimab fact sheet for patients, parents and caregivers prior to infusion.  Complications: No immediate complications noted.  Discharge: Discharged home   Kimberly [name] 06/11/2020

## 2020-07-27 DIAGNOSIS — M1711 Unilateral primary osteoarthritis, right knee: Secondary | ICD-10-CM | POA: Diagnosis not present

## 2020-07-27 DIAGNOSIS — M25512 Pain in left shoulder: Secondary | ICD-10-CM | POA: Diagnosis not present

## 2020-07-27 DIAGNOSIS — M1120 Other chondrocalcinosis, unspecified site: Secondary | ICD-10-CM | POA: Diagnosis not present

## 2020-07-27 DIAGNOSIS — M7582 Other shoulder lesions, left shoulder: Secondary | ICD-10-CM | POA: Diagnosis not present

## 2020-07-27 DIAGNOSIS — M159 Polyosteoarthritis, unspecified: Secondary | ICD-10-CM | POA: Diagnosis not present

## 2020-07-27 DIAGNOSIS — M1180 Other specified crystal arthropathies, unspecified site: Secondary | ICD-10-CM | POA: Diagnosis not present

## 2020-08-10 ENCOUNTER — Emergency Department (HOSPITAL_BASED_OUTPATIENT_CLINIC_OR_DEPARTMENT_OTHER)
Admission: EM | Admit: 2020-08-10 | Discharge: 2020-08-10 | Disposition: A | Discharge status: Home / Self Care | Payer: Medicare Other | Attending: Emergency Medicine | Admitting: Emergency Medicine

## 2020-08-10 ENCOUNTER — Emergency Department (HOSPITAL_BASED_OUTPATIENT_CLINIC_OR_DEPARTMENT_OTHER): Payer: Medicare Other

## 2020-08-10 ENCOUNTER — Other Ambulatory Visit: Payer: Self-pay

## 2020-08-10 ENCOUNTER — Encounter (HOSPITAL_BASED_OUTPATIENT_CLINIC_OR_DEPARTMENT_OTHER): Payer: Self-pay | Admitting: *Deleted

## 2020-08-10 DIAGNOSIS — Z96652 Presence of left artificial knee joint: Secondary | ICD-10-CM | POA: Insufficient documentation

## 2020-08-10 DIAGNOSIS — K5792 Diverticulitis of intestine, part unspecified, without perforation or abscess without bleeding: Secondary | ICD-10-CM | POA: Diagnosis not present

## 2020-08-10 DIAGNOSIS — R1032 Left lower quadrant pain: Secondary | ICD-10-CM | POA: Diagnosis not present

## 2020-08-10 DIAGNOSIS — K5732 Diverticulitis of large intestine without perforation or abscess without bleeding: Secondary | ICD-10-CM | POA: Insufficient documentation

## 2020-08-10 DIAGNOSIS — S32511A Fracture of superior rim of right pubis, initial encounter for closed fracture: Secondary | ICD-10-CM | POA: Diagnosis not present

## 2020-08-10 DIAGNOSIS — Z86010 Personal history of colonic polyps: Secondary | ICD-10-CM | POA: Diagnosis not present

## 2020-08-10 DIAGNOSIS — D72829 Elevated white blood cell count, unspecified: Secondary | ICD-10-CM | POA: Diagnosis not present

## 2020-08-10 DIAGNOSIS — K5790 Diverticulosis of intestine, part unspecified, without perforation or abscess without bleeding: Secondary | ICD-10-CM | POA: Diagnosis not present

## 2020-08-10 DIAGNOSIS — N281 Cyst of kidney, acquired: Secondary | ICD-10-CM | POA: Diagnosis not present

## 2020-08-10 DIAGNOSIS — S32591A Other specified fracture of right pubis, initial encounter for closed fracture: Secondary | ICD-10-CM | POA: Diagnosis not present

## 2020-08-10 MED ORDER — IOHEXOL 300 MG/ML  SOLN
100.0000 mL | Freq: Once | INTRAMUSCULAR | Status: AC | PRN
  Administered 2020-08-10: 80 mL via INTRAVENOUS

## 2020-08-10 MED ORDER — MOXIFLOXACIN HCL 400 MG PO TABS: 400.0000 mg | ORAL_TABLET | Freq: Every day | ORAL | 0 refills | Status: DC

## 2020-08-10 NOTE — ED Provider Notes (Signed)
[facility] EMERGENCY DEPARTMENT Provider Note  CSN: [id number] Arrival date & time: 08/10/20 1357    History Chief Complaint  Patient presents with  . Abdominal Pain    HPI  Kimberly [name] is a 79 y.o. female with history of hysterectomy and colon polyps reports 2-3 days of moderate aching LLQ pain. Not associated with fever, N/V/D/C or dysuria. She was seen by PCP today and had labs showing a leukocytosis (WBC 15). She was sent to the ED for further evaluation.    Past Medical History:  Diagnosis Date  . Complication of anesthesia    Pt reports that she was told "someone had to breath for her" She is not clear what this means  . Hypercholesteremia   . Peripheral neuropathy   . Restless leg syndrome     Past Surgical History:  Procedure Laterality Date  . ABDOMINAL HYSTERECTOMY     Age 59  . BACK SURGERY    . EYE SURGERY     bilateral cateract   . TONSILLECTOMY    . 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 Breast cancer Sister   . Other Mother        passed away at age 51  . Heart attack Father        passed away at age 53    Social History   Tobacco Use  . Smoking status: Never Smoker  . Smokeless tobacco: Never Used  Vaping Use  . Vaping Use: Never used  Substance Use Topics  . Alcohol use: No  . Drug use: No     Home Medications Prior to Admission medications   Medication Sig Start Date End Date Taking? Authorizing Provider  acetaminophen (TYLENOL) 500 MG tablet Take 2 tablets (1,000 mg total) by mouth every 8 (eight) hours. 12/04/19   Danae Orleans, PA-C  CALCIUM PO Take 1,200 mg by mouth daily.    [provider]  CRANBERRY PO Take 1 tablet by mouth daily.    [provider]  docusate sodium (COLACE) 100 MG capsule Take 1  capsule (100 mg total) by mouth 2 (two) times daily. 12/04/19   Danae Orleans, PA-C  DULoxetine (CYMBALTA) 60 MG capsule Take 1 capsule by mouth once daily 11/20/19   Marcial Pacas, MD  ergocalciferol (VITAMIN D2) 50000 units capsule Take 50,000 Units by mouth once a week.    [provider]  ferrous sulfate (FERROUSUL) 325 (65 FE) MG tablet Take 1 tablet (325 mg total) by mouth 3 (three) times daily with meals for 14 days. 12/04/19 12/18/19  Danae Orleans, PA-C  gabapentin (NEURONTIN) 300 MG capsule Take one capsule in morning, one capsule midday, two capsules at bedtime. Patient taking differently: Take 300-600 mg by mouth See admin instructions. Take one capsule in morning, one capsule midday, two capsules at bedtime. 03/25/19   Marcial Pacas, MD  magnesium oxide (MAG-OX) 400 MG tablet Take 400 mg by mouth daily.    [provider]  MEGARED OMEGA-3 KRILL OIL PO Take 1 capsule by mouth once a week.     [provider]  methocarbamol (ROBAXIN) 500 MG tablet Take 1 tablet (500 mg total) by mouth every 6 (six) hours as needed for muscle spasms. 12/04/19   Danae Orleans, PA-C  moxifloxacin (AVELOX) 400  MG tablet Take 1 tablet (400 mg total) by mouth daily at 8 pm. 08/10/20   Truddie Hidden, MD  nortriptyline (PAMELOR) 25 MG capsule TAKE 3 CAPSULES BY MOUTH AT BEDTIME 11/20/19   Marcial Pacas, MD  oxyCODONE (OXY IR/ROXICODONE) 5 MG immediate release tablet Take 1-2 tablets (5-10 mg total) by mouth every 6 (six) hours as needed for moderate pain or severe pain. 12/04/19   Danae Orleans, PA-C  Polyethyl Glycol-Propyl Glycol (SYSTANE) 0.4-0.3 % SOLN Place 1 drop into both eyes 3 (three) times daily as needed (dry/irritated eyes).    [provider]  polyethylene glycol (MIRALAX / GLYCOLAX) 17 g packet Take 17 g by mouth 2 (two) times daily. 12/04/19   Danae Orleans, PA-C  pramipexole (MIRAPEX) 0.25 MG tablet Take 0.25 mg by mouth at bedtime.     [provider]   simvastatin (ZOCOR) 40 MG tablet Take 40 mg by mouth at bedtime.     [provider]  traZODone (DESYREL) 50 MG tablet Take 50 mg by mouth at bedtime.    [provider]     Allergies    Amoxicillin, Ciprofloxacin, Adhesive [tape], Hydrocodone-acetaminophen, and Sulfa antibiotics   Review of Systems   Review of Systems A comprehensive review of systems was completed and negative except as noted in HPI.    Physical Exam BP (!) 155/74   Pulse 89   Temp 98 F (36.7 C) (Oral)   Resp 17   SpO2 96%   Physical Exam Vitals and nursing note reviewed.  Constitutional:      Appearance: Normal appearance.  HENT:     Head: Normocephalic and atraumatic.     Nose: Nose normal.     Mouth/Throat:     Mouth: Mucous membranes are moist.  Eyes:     Extraocular Movements: Extraocular movements intact.     Conjunctiva/sclera: Conjunctivae normal.  Cardiovascular:     Rate and Rhythm: Normal rate.  Pulmonary:     Effort: Pulmonary effort is normal.     Breath sounds: Normal breath sounds.  Abdominal:     General: Abdomen is flat.     Palpations: Abdomen is soft.     Tenderness: There is abdominal tenderness in the left lower quadrant. There is guarding. Negative signs include Murphy's sign and McBurney's sign.  Musculoskeletal:        General: No swelling. Normal range of motion.     Cervical back: Neck supple.  Skin:    General: Skin is warm and dry.  Neurological:     General: No focal deficit present.     Mental Status: She is alert.  Psychiatric:        Mood and Affect: Mood normal.      ED Results / Procedures / Treatments   Labs (all labs ordered are listed, but only abnormal results are displayed) Labs Reviewed - No data to display  EKG None   Radiology CT Abdomen Pelvis W Contrast  Result Date: 08/10/2020 CLINICAL DATA:  Left lower quadrant pain EXAM: CT ABDOMEN AND PELVIS WITH CONTRAST TECHNIQUE: Multidetector CT imaging of the abdomen and  pelvis was performed using the standard protocol following bolus administration of intravenous contrast. CONTRAST:  18mL OMNIPAQUE IOHEXOL 300 MG/ML  SOLN COMPARISON:  Ultrasound 05/19/2017 FINDINGS: Lower chest: Lung bases demonstrate scarring or atelectasis left base. Benign-appearing ovoid focus of fatty density at the right hepatic dome/lung base. Hepatobiliary: No focal liver abnormality is seen. No gallstones, gallbladder wall thickening, or biliary dilatation.  Pancreas: Unremarkable. No pancreatic ductal dilatation or surrounding inflammatory changes. Spleen: Normal in size without focal abnormality. Adrenals/Urinary Tract: Adrenal glands are normal. Cyst in the mid left kidney. No hydronephrosis. The bladder is normal Stomach/Bowel: The stomach is nonenlarged. No dilated small bowel. 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 Focal wall thickening with moderate inflammatory change at the distal descending/proximal sigmoid colon consistent with diverticulitis. No perforation or abscess. Negative appendix. Vascular/Lymphatic: Moderate aortic atherosclerosis. No aneurysm. No suspicious nodes. Reproductive: Status post hysterectomy. No adnexal masses. Other: Negative for free air or free fluid Musculoskeletal: Degenerative changes at L5-S1. Chronic fracture deformities of the right superior and inferior pubic rami. IMPRESSION: Findings consistent with acute diverticulitis of the distal descending/proximal sigmoid colon. No perforation or abscess. Aortic Atherosclerosis (ICD10-I70.0). Electronically Signed   By: Donavan Foil M.D.   On: 08/10/2020 17:43    Procedures Procedures  Medications Ordered in the ED Medications  iohexol (OMNIPAQUE) 300 MG/ML solution 100 mL (80 mLs Intravenous Contrast Given 08/10/20 1708)     MDM Rules/Calculators/A&P MDM Exam is concerning for diverticulitis. Will send to CT for confirmation, unfortunately, the patient has listed allergies to Cipro, Flagyl  and Amoxil although she is unsure of her reaction since it has been many years since she's had any of those medications.  ED Course  I have reviewed the triage vital signs and the nursing notes.  Pertinent labs & imaging results that were available during my care of the patient were reviewed by me and considered in my medical decision making (see chart for details).  Clinical Course as of 08/10/20 1807  Mon Aug 10, 2020  1748 CT confirms uncomplicated diverticulitis. Discussed with ED  Pharmacy who will review her chart and call back with recommendations.  [CS]  [id number] Per pharmacy they recommend Moxifloxacin, patient reports some prior itching and confusion with Cipro, but no significant hypersensitivity reaction. She was advised that if she does not tolerate Moxifloxacin then she would likely need daily outpatient Invanz infusions. Otherwise she is non-toxic appearing, in no distress and safe for discharge home.  [CS]    Clinical Course User Index [CS] Truddie Hidden, MD    Final Clinical Impression(s) / ED Diagnoses Final diagnoses:  Diverticulitis    Rx / DC Orders ED Discharge Orders         Ordered    moxifloxacin (AVELOX) 400 MG tablet  Daily        08/10/20 1807           Truddie Hidden, MD 08/10/20 1807

## 2020-08-10 NOTE — ED Triage Notes (Signed)
Lower left quad pain x 2 days. She was seen today by her nurse practitioner and told to come here. She had blood and urine test.

## 2020-08-11 ENCOUNTER — Other Ambulatory Visit: Payer: Self-pay | Admitting: Internal Medicine

## 2020-08-11 DIAGNOSIS — R1032 Left lower quadrant pain: Secondary | ICD-10-CM

## 2020-08-12 DIAGNOSIS — K5792 Diverticulitis of intestine, part unspecified, without perforation or abscess without bleeding: Secondary | ICD-10-CM | POA: Diagnosis not present

## 2020-08-12 DIAGNOSIS — E7800 Pure hypercholesterolemia, unspecified: Secondary | ICD-10-CM | POA: Diagnosis not present

## 2020-08-12 DIAGNOSIS — D72829 Elevated white blood cell count, unspecified: Secondary | ICD-10-CM | POA: Diagnosis not present

## 2020-08-19 DIAGNOSIS — D72829 Elevated white blood cell count, unspecified: Secondary | ICD-10-CM | POA: Diagnosis not present

## 2020-08-25 DIAGNOSIS — E7800 Pure hypercholesterolemia, unspecified: Secondary | ICD-10-CM | POA: Diagnosis not present

## 2020-08-25 DIAGNOSIS — K5792 Diverticulitis of intestine, part unspecified, without perforation or abscess without bleeding: Secondary | ICD-10-CM | POA: Diagnosis not present

## 2020-09-01 DIAGNOSIS — M810 Age-related osteoporosis without current pathological fracture: Secondary | ICD-10-CM | POA: Diagnosis not present

## 2020-09-03 ENCOUNTER — Other Ambulatory Visit: Payer: Self-pay

## 2020-09-03 ENCOUNTER — Encounter (HOSPITAL_BASED_OUTPATIENT_CLINIC_OR_DEPARTMENT_OTHER): Payer: Self-pay

## 2020-09-03 ENCOUNTER — Emergency Department (HOSPITAL_BASED_OUTPATIENT_CLINIC_OR_DEPARTMENT_OTHER): Payer: Medicare Other

## 2020-09-03 ENCOUNTER — Emergency Department (HOSPITAL_BASED_OUTPATIENT_CLINIC_OR_DEPARTMENT_OTHER)
Admission: EM | Admit: 2020-09-03 | Discharge: 2020-09-03 | Disposition: A | Discharge status: Home / Self Care | Payer: Medicare Other | Attending: Emergency Medicine | Admitting: Emergency Medicine

## 2020-09-03 DIAGNOSIS — K5792 Diverticulitis of intestine, part unspecified, without perforation or abscess without bleeding: Secondary | ICD-10-CM | POA: Insufficient documentation

## 2020-09-03 DIAGNOSIS — R109 Unspecified abdominal pain: Secondary | ICD-10-CM | POA: Diagnosis not present

## 2020-09-03 DIAGNOSIS — Z96652 Presence of left artificial knee joint: Secondary | ICD-10-CM | POA: Diagnosis not present

## 2020-09-03 HISTORY — DX: Diverticulitis of intestine, part unspecified, without perforation or abscess without bleeding: K57.92

## 2020-09-03 LAB — CBC WITH DIFFERENTIAL/PLATELET
Abs Immature Granulocytes: 0.04 10*3/uL (ref 0.00–0.07)
Basophils Absolute: 0 10*3/uL (ref 0.0–0.1)
Basophils Relative: 0 %
Eosinophils Absolute: 0.1 10*3/uL (ref 0.0–0.5)
Eosinophils Relative: 1 %
HCT: 38.7 % (ref 36.0–46.0)
Hemoglobin: 12.7 g/dL (ref 12.0–15.0)
Immature Granulocytes: 0 %
Lymphocytes Relative: 13 %
Lymphs Abs: 1.5 10*3/uL (ref 0.7–4.0)
MCH: 31.3 pg (ref 26.0–34.0)
MCHC: 32.8 g/dL (ref 30.0–36.0)
MCV: 95.3 fL (ref 80.0–100.0)
Monocytes Absolute: 1.4 10*3/uL — ABNORMAL HIGH (ref 0.1–1.0)
Monocytes Relative: 12 %
Neutro Abs: 9 10*3/uL — ABNORMAL HIGH (ref 1.7–7.7)
Neutrophils Relative %: 74 %
Platelets: 302 10*3/uL (ref 150–400)
RBC: 4.06 MIL/uL (ref 3.87–5.11)
RDW: 13 % (ref 11.5–15.5)
WBC: 12.1 10*3/uL — ABNORMAL HIGH (ref 4.0–10.5)
nRBC: 0 % (ref 0.0–0.2)

## 2020-09-03 LAB — URINALYSIS, ROUTINE W REFLEX MICROSCOPIC
Bilirubin Urine: NEGATIVE
Glucose, UA: NEGATIVE mg/dL
Hgb urine dipstick: NEGATIVE
Ketones, ur: 15 mg/dL — AB
Leukocytes,Ua: NEGATIVE
Nitrite: NEGATIVE
Protein, ur: NEGATIVE mg/dL
Specific Gravity, Urine: 1.015 (ref 1.005–1.030)
pH: 6.5 (ref 5.0–8.0)

## 2020-09-03 LAB — COMPREHENSIVE METABOLIC PANEL
ALT: 13 U/L (ref 0–44)
AST: 20 U/L (ref 15–41)
Albumin: 3.8 g/dL (ref 3.5–5.0)
Alkaline Phosphatase: 61 U/L (ref 38–126)
Anion gap: 11 (ref 5–15)
BUN: 12 mg/dL (ref 8–23)
CO2: 24 mmol/L (ref 22–32)
Calcium: 8.7 mg/dL — ABNORMAL LOW (ref 8.9–10.3)
Chloride: 100 mmol/L (ref 98–111)
Creatinine, Ser: 0.7 mg/dL (ref 0.44–1.00)
GFR, Estimated: >60 mL/min (ref >60)
Glucose, Bld: 119 mg/dL — ABNORMAL HIGH (ref 70–99)
Potassium: 3.9 mmol/L (ref 3.5–5.1)
Sodium: 135 mmol/L (ref 135–145)
Total Bilirubin: 0.5 mg/dL (ref 0.3–1.2)
Total Protein: 6.7 g/dL (ref 6.5–8.1)

## 2020-09-03 LAB — LIPASE, BLOOD: Lipase: 25 U/L (ref 11–51)

## 2020-09-03 MED ORDER — FENTANYL CITRATE (PF) 100 MCG/2ML IJ SOLN
50.0000 ug | Freq: Once | INTRAMUSCULAR | Status: AC
  Administered 2020-09-03: 50 ug via INTRAVENOUS
  Filled 2020-09-03: qty 2

## 2020-09-03 MED ORDER — MOXIFLOXACIN HCL 400 MG PO TABS: 400.0000 mg | ORAL_TABLET | Freq: Every day | ORAL | 0 refills | Status: DC

## 2020-09-03 MED ORDER — IOHEXOL 300 MG/ML  SOLN
100.0000 mL | Freq: Once | INTRAMUSCULAR | Status: AC | PRN
  Administered 2020-09-03: 100 mL via INTRAVENOUS

## 2020-09-03 NOTE — ED Provider Notes (Signed)
[facility] EMERGENCY DEPARTMENT Provider Note  CSN: [id number] Arrival date & time: 09/03/20 1047    History Chief Complaint  Patient presents with  . Abdominal Pain    HPI  Kimberly [name] is a 80 y.o. female with recent diverticulitis diagnosed on ED visit 12/13, treated with moxifloxacin due to multiple drug allergies reports she got better after Abx but since yesterday has had worsening LLQ pain, moderate to severe, worse with movement and palpation, not associated with N/V/D or dysuria. No fevers.   Past Medical History:  Diagnosis Date  . Complication of anesthesia    Pt reports that she was told "someone had to breath for her" She is not clear what this means  . Diverticulitis   . Hypercholesteremia   . Peripheral neuropathy   . Restless leg syndrome     Past Surgical History:  Procedure Laterality Date  . ABDOMINAL HYSTERECTOMY     Age 1  . BACK SURGERY    . EYE SURGERY     bilateral cateract   . TONSILLECTOMY    . 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 Breast cancer Sister   . Other Mother        passed away at age 48  . Heart attack Father        passed away at age 69    Social History   Tobacco Use  . Smoking status: Never Smoker  . Smokeless tobacco: Never Used  Vaping Use  . Vaping Use: Never used  Substance Use Topics  . Alcohol use: No  . Drug use: No     Home Medications Prior to Admission medications   Medication Sig Start Date End Date Taking? Authorizing Provider  acetaminophen (TYLENOL) 500 MG tablet Take 2 tablets (1,000 mg total) by mouth every 8 (eight) hours. 12/04/19  Yes Danae Orleans, PA-C  DULoxetine (CYMBALTA) 60 MG capsule Take 1 capsule by mouth once daily 11/20/19  Yes Marcial Pacas, MD  ergocalciferol (VITAMIN D2)  50000 units capsule Take 50,000 Units by mouth once a week.   Yes [provider]  gabapentin (NEURONTIN) 300 MG capsule Take one capsule in morning, one capsule midday, two capsules at bedtime. Patient taking differently: Take 300-600 mg by mouth See admin instructions. Take one capsule in morning, one capsule midday, two capsules at bedtime. 03/25/19  Yes Marcial Pacas, MD  Polyethyl Glycol-Propyl Glycol (SYSTANE) 0.4-0.3 % SOLN Place 1 drop into both eyes 3 (three) times daily as needed (dry/irritated eyes).   Yes [provider]  simvastatin (ZOCOR) 40 MG tablet Take 40 mg by mouth at bedtime.    Yes [provider]  traZODone (DESYREL) 50 MG tablet Take 50 mg by mouth at bedtime.   Yes [provider]  moxifloxacin (AVELOX) 400 MG tablet Take 1 tablet (400 mg total) by mouth daily. 09/03/20   Truddie Hidden, MD  ferrous sulfate (FERROUSUL) 325 (65 FE) MG tablet Take 1 tablet (325 mg total) by mouth 3 (three) times daily with meals for 14 days. 12/04/19 09/03/20  Danae Orleans, PA-C  nortriptyline (PAMELOR) 25 MG capsule TAKE 3 CAPSULES BY MOUTH AT BEDTIME 11/20/19 09/03/20  Marcial Pacas, MD  pramipexole (MIRAPEX) 0.25 MG tablet Take 0.25 mg by mouth at bedtime.   09/03/20  [provider]     Allergies    Amoxicillin, Ciprofloxacin, Adhesive [tape], Hydrocodone-acetaminophen, and Sulfa antibiotics   Review of Systems   Review of Systems A comprehensive review of systems was completed and negative except as noted in HPI.    Physical Exam BP (!) 164/95   Pulse 94   Temp 98.3 F (36.8 C) (Oral)   Resp 19   Ht 5\' 4"  (1.626 m)   Wt 73.9 kg   SpO2 94%   BMI 27.98 kg/m   Physical Exam Vitals and nursing note reviewed.  Constitutional:      Appearance: Normal appearance.  HENT:     Head: Normocephalic and atraumatic.     Nose: Nose normal.     Mouth/Throat:     Mouth: Mucous membranes are moist.  Eyes:     Extraocular Movements: Extraocular  movements intact.     Conjunctiva/sclera: Conjunctivae normal.  Cardiovascular:     Rate and Rhythm: Normal rate.  Pulmonary:     Effort: Pulmonary effort is normal.     Breath sounds: Normal breath sounds.  Abdominal:     General: Abdomen is flat.     Palpations: Abdomen is soft.     Tenderness: There is abdominal tenderness in the left lower quadrant. There is guarding. Negative signs include Murphy's sign and McBurney's sign.  Musculoskeletal:        General: No swelling. Normal range of motion.     Cervical back: Neck supple.  Skin:    General: Skin is warm and dry.  Neurological:     General: No focal deficit present.     Mental Status: She is alert.  Psychiatric:        Mood and Affect: Mood normal.      ED Results / Procedures / Treatments   Labs (all labs ordered are listed, but only abnormal results are displayed) Labs Reviewed  COMPREHENSIVE METABOLIC PANEL - Abnormal; Notable for the following components:      Result Value   Glucose, Bld 119 (*)    Calcium 8.7 (*)    All other components within normal limits  CBC WITH DIFFERENTIAL/PLATELET - Abnormal; Notable for the following components:   WBC 12.1 (*)    Neutro Abs 9.0 (*)    Monocytes Absolute 1.4 (*)    All other components within normal limits  URINALYSIS, ROUTINE W REFLEX MICROSCOPIC - Abnormal; Notable for the following components:   Color, Urine STRAW (*)    Ketones, ur 15 (*)    All other components within normal limits  LIPASE, BLOOD    EKG None   Radiology CT Abdomen Pelvis W Contrast  Result Date: 09/03/2020 CLINICAL DATA:  Acute lower abdominal pain. EXAM: CT ABDOMEN AND PELVIS WITH CONTRAST TECHNIQUE: Multidetector CT imaging of the abdomen and pelvis was performed using the standard protocol following bolus administration of intravenous contrast. CONTRAST:  11/01/2020 OMNIPAQUE IOHEXOL 300 MG/ML  SOLN COMPARISON:  August 10, 2020. FINDINGS: Lower chest: No acute abnormality. Hepatobiliary: No  focal liver abnormality is seen. No gallstones, gallbladder wall thickening, or biliary dilatation. Pancreas: Unremarkable. No pancreatic ductal dilatation or surrounding inflammatory changes. Spleen: Normal in size without focal abnormality. Adrenals/Urinary Tract: Adrenal glands are unremarkable. Kidneys are normal, without renal calculi, focal lesion, or hydronephrosis. Bladder is unremarkable. Stomach/Bowel: The stomach appears normal. The appendix appears normal. There is no evidence of bowel obstruction. There is now noted diverticulitis of the distal sigmoid colon as well as focal diverticulitis involving the  proximal descending colon just beyond the splenic flexure. No definite abscess is noted at this time. Vascular/Lymphatic: Aortic atherosclerosis. No enlarged abdominal or pelvic lymph nodes. Reproductive: Status post hysterectomy. No adnexal masses. Other: Moderate size fat containing periumbilical hernia is noted. No ascites is noted. Musculoskeletal: No acute or significant osseous findings. IMPRESSION: 1. Interval development of diverticulitis of the distal sigmoid colon as well as focal diverticulitis involving the proximal descending colon just beyond the splenic flexure. No definite abscess is noted at this time. 2. Moderate size fat containing periumbilical hernia. 3. Aortic atherosclerosis. Aortic Atherosclerosis (ICD10-I70.0). Electronically Signed   By: Marijo Conception M.D.   On: 09/03/2020 13:12    Procedures Procedures  Medications Ordered in the ED Medications  iohexol (OMNIPAQUE) 300 MG/ML solution 100 mL (100 mLs Intravenous Contrast Given 09/03/20 1250)  fentaNYL (SUBLIMAZE) injection 50 mcg (50 mcg Intravenous Given 09/03/20 1408)     MDM Rules/Calculators/A&P MDM Exam concerning for recurrence of diverticulitis. Her treatment was limited by drug allergies, but she seems to have tolerated Avelox well. Will recheck labs and CT. Patient declines pain/nausea meds for now.  ED  Course  I have reviewed the triage vital signs and the nursing notes.  Pertinent labs & imaging results that were available during my care of the patient were reviewed by me and considered in my medical decision making (see chart for details).  Clinical Course as of 09/03/20 1450  Thu Sep 03, 2020  1221 CBC with mild leukocytosis.  [CS]  [id number] UA neg.  [CS]  [id number] CMP and Lipase are unremarkable.  [CS]  [id number] CT images and results reviewed, recurrence of uncomplicated diverticulitis. She is well appearing and non-toxic. No indication for admission or IV antibiotics at this time. Will give an extended course of Avelox which she tolerated well last time. Referral to GI and Gen Surgery to establish in case she continues to develop frequent flares for consideration of colectomy.  [CS]    Clinical Course User Index [CS] Truddie Hidden, MD    Final Clinical Impression(s) / ED Diagnoses Final diagnoses:  Diverticulitis    Rx / DC Orders ED Discharge Orders         Ordered    moxifloxacin (AVELOX) 400 MG tablet  Daily        09/03/20 1354           Truddie Hidden, MD 09/03/20 1450

## 2020-09-03 NOTE — ED Triage Notes (Signed)
Pt arrives with c/o lower abdominal pain starting last night. Pt reports recently having diverticulitis, was sent home with antibiotics, reports she got better from that episode.

## 2020-09-03 NOTE — ED Notes (Signed)
Pt here with complaints of lower abdominal pain that started last night. Denies n/v/d  Pt had recent bout of diverticulitis.

## 2020-10-07 ENCOUNTER — Other Ambulatory Visit: Payer: Self-pay | Admitting: Internal Medicine

## 2020-10-07 DIAGNOSIS — K219 Gastro-esophageal reflux disease without esophagitis: Secondary | ICD-10-CM | POA: Diagnosis not present

## 2020-10-07 DIAGNOSIS — I10 Essential (primary) hypertension: Secondary | ICD-10-CM | POA: Diagnosis not present

## 2020-10-07 DIAGNOSIS — K5732 Diverticulitis of large intestine without perforation or abscess without bleeding: Secondary | ICD-10-CM | POA: Diagnosis not present

## 2020-10-07 DIAGNOSIS — Z1231 Encounter for screening mammogram for malignant neoplasm of breast: Secondary | ICD-10-CM

## 2020-10-07 DIAGNOSIS — R933 Abnormal findings on diagnostic imaging of other parts of digestive tract: Secondary | ICD-10-CM | POA: Diagnosis not present

## 2020-10-08 DIAGNOSIS — I10 Essential (primary) hypertension: Secondary | ICD-10-CM | POA: Diagnosis not present

## 2020-10-19 ENCOUNTER — Other Ambulatory Visit: Payer: Self-pay | Admitting: Gastroenterology

## 2020-10-26 DIAGNOSIS — I10 Essential (primary) hypertension: Secondary | ICD-10-CM | POA: Diagnosis not present

## 2020-10-30 DIAGNOSIS — Z96652 Presence of left artificial knee joint: Secondary | ICD-10-CM | POA: Diagnosis not present

## 2020-10-30 DIAGNOSIS — M1711 Unilateral primary osteoarthritis, right knee: Secondary | ICD-10-CM | POA: Diagnosis not present

## 2020-11-05 ENCOUNTER — Encounter (HOSPITAL_COMMUNITY): Payer: Self-pay | Admitting: Gastroenterology

## 2020-11-05 ENCOUNTER — Other Ambulatory Visit: Payer: Self-pay

## 2020-11-06 DIAGNOSIS — H04123 Dry eye syndrome of bilateral lacrimal glands: Secondary | ICD-10-CM | POA: Diagnosis not present

## 2020-11-06 DIAGNOSIS — Z961 Presence of intraocular lens: Secondary | ICD-10-CM | POA: Diagnosis not present

## 2020-11-10 ENCOUNTER — Other Ambulatory Visit (HOSPITAL_COMMUNITY)
Admission: RE | Admit: 2020-11-10 | Discharge: 2020-11-10 | Disposition: A | Discharge status: Home / Self Care | Payer: Medicare Other | Source: Ambulatory Visit | Attending: Gastroenterology | Admitting: Gastroenterology

## 2020-11-10 DIAGNOSIS — Z20822 Contact with and (suspected) exposure to covid-19: Secondary | ICD-10-CM | POA: Insufficient documentation

## 2020-11-10 DIAGNOSIS — Z01812 Encounter for preprocedural laboratory examination: Secondary | ICD-10-CM | POA: Insufficient documentation

## 2020-11-10 LAB — SARS CORONAVIRUS 2 (TAT 6-24 HRS): SARS Coronavirus 2: NEGATIVE

## 2020-11-13 ENCOUNTER — Ambulatory Visit (HOSPITAL_COMMUNITY): Payer: Medicare Other | Admitting: Anesthesiology

## 2020-11-13 ENCOUNTER — Encounter (HOSPITAL_COMMUNITY): Payer: Self-pay | Admitting: Gastroenterology

## 2020-11-13 ENCOUNTER — Ambulatory Visit (HOSPITAL_COMMUNITY)
Admission: RE | Admit: 2020-11-13 | Discharge: 2020-11-13 | Disposition: A | Discharge status: Home / Self Care | Payer: Medicare Other | Attending: Gastroenterology | Admitting: Gastroenterology

## 2020-11-13 ENCOUNTER — Other Ambulatory Visit: Payer: Self-pay

## 2020-11-13 ENCOUNTER — Encounter (HOSPITAL_COMMUNITY)
Admission: RE | Disposition: A | Discharge status: Home / Self Care | Payer: Self-pay | Source: Home / Self Care | Attending: Gastroenterology

## 2020-11-13 DIAGNOSIS — Z96652 Presence of left artificial knee joint: Secondary | ICD-10-CM | POA: Diagnosis not present

## 2020-11-13 DIAGNOSIS — Z880 Allergy status to penicillin: Secondary | ICD-10-CM | POA: Diagnosis not present

## 2020-11-13 DIAGNOSIS — R933 Abnormal findings on diagnostic imaging of other parts of digestive tract: Secondary | ICD-10-CM | POA: Diagnosis not present

## 2020-11-13 DIAGNOSIS — K219 Gastro-esophageal reflux disease without esophagitis: Secondary | ICD-10-CM | POA: Insufficient documentation

## 2020-11-13 DIAGNOSIS — K449 Diaphragmatic hernia without obstruction or gangrene: Secondary | ICD-10-CM | POA: Diagnosis not present

## 2020-11-13 DIAGNOSIS — G629 Polyneuropathy, unspecified: Secondary | ICD-10-CM | POA: Insufficient documentation

## 2020-11-13 DIAGNOSIS — Z885 Allergy status to narcotic agent status: Secondary | ICD-10-CM | POA: Diagnosis not present

## 2020-11-13 DIAGNOSIS — Z888 Allergy status to other drugs, medicaments and biological substances status: Secondary | ICD-10-CM | POA: Diagnosis not present

## 2020-11-13 DIAGNOSIS — Z8249 Family history of ischemic heart disease and other diseases of the circulatory system: Secondary | ICD-10-CM | POA: Insufficient documentation

## 2020-11-13 DIAGNOSIS — Z882 Allergy status to sulfonamides status: Secondary | ICD-10-CM | POA: Insufficient documentation

## 2020-11-13 DIAGNOSIS — Z800 Family history of malignant neoplasm of digestive organs: Secondary | ICD-10-CM | POA: Diagnosis not present

## 2020-11-13 DIAGNOSIS — K635 Polyp of colon: Secondary | ICD-10-CM | POA: Insufficient documentation

## 2020-11-13 DIAGNOSIS — K5732 Diverticulitis of large intestine without perforation or abscess without bleeding: Secondary | ICD-10-CM | POA: Insufficient documentation

## 2020-11-13 DIAGNOSIS — R1032 Left lower quadrant pain: Secondary | ICD-10-CM | POA: Diagnosis not present

## 2020-11-13 DIAGNOSIS — Z881 Allergy status to other antibiotic agents status: Secondary | ICD-10-CM | POA: Insufficient documentation

## 2020-11-13 DIAGNOSIS — E7800 Pure hypercholesterolemia, unspecified: Secondary | ICD-10-CM | POA: Insufficient documentation

## 2020-11-13 DIAGNOSIS — R12 Heartburn: Secondary | ICD-10-CM | POA: Diagnosis not present

## 2020-11-13 DIAGNOSIS — Z8616 Personal history of COVID-19: Secondary | ICD-10-CM | POA: Insufficient documentation

## 2020-11-13 DIAGNOSIS — Z90710 Acquired absence of both cervix and uterus: Secondary | ICD-10-CM | POA: Insufficient documentation

## 2020-11-13 DIAGNOSIS — K5730 Diverticulosis of large intestine without perforation or abscess without bleeding: Secondary | ICD-10-CM | POA: Diagnosis not present

## 2020-11-13 HISTORY — PX: POLYPECTOMY: SHX5525

## 2020-11-13 HISTORY — PX: ESOPHAGOGASTRODUODENOSCOPY (EGD) WITH PROPOFOL: SHX5813

## 2020-11-13 HISTORY — PX: COLONOSCOPY WITH PROPOFOL: SHX5780

## 2020-11-13 HISTORY — DX: Failed or difficult intubation, initial encounter: T88.4XXA

## 2020-11-13 SURGERY — ESOPHAGOGASTRODUODENOSCOPY (EGD) WITH PROPOFOL
Anesthesia: Monitor Anesthesia Care

## 2020-11-13 MED ORDER — SODIUM CHLORIDE 0.9 % IV SOLN: INTRAVENOUS | Status: DC

## 2020-11-13 MED ORDER — LACTATED RINGERS IV SOLN: INTRAVENOUS | Status: DC

## 2020-11-13 MED ORDER — PROPOFOL 500 MG/50ML IV EMUL
INTRAVENOUS | Status: DC | PRN
  Administered 2020-11-13: 125 ug/kg/min via INTRAVENOUS

## 2020-11-13 MED ORDER — LIDOCAINE HCL (CARDIAC) PF 100 MG/5ML IV SOSY
PREFILLED_SYRINGE | INTRAVENOUS | Status: DC | PRN
  Administered 2020-11-13: 100 mg via INTRAVENOUS

## 2020-11-13 MED ORDER — PROPOFOL 10 MG/ML IV BOLUS
INTRAVENOUS | Status: DC | PRN
  Administered 2020-11-13: 20 mg via INTRAVENOUS

## 2020-11-13 SURGICAL SUPPLY — 24 items

## 2020-11-13 NOTE — Anesthesia Preprocedure Evaluation (Signed)
Anesthesia Evaluation  Patient identified by MRN, date of birth, ID band Patient awake    Reviewed: Allergy & Precautions, NPO status , Patient's Chart, lab work & pertinent test results  History of Anesthesia Complications (+) DIFFICULT AIRWAY and history of anesthetic complications (Has difficult airway letter (easy mask, intubated with Eschmann))  Airway Mallampati: III  TM Distance: <3 FB Neck ROM: Limited    Dental no notable dental hx.    Pulmonary  covid 05/2020   Pulmonary exam normal breath sounds clear to auscultation       Cardiovascular negative cardio ROS Normal cardiovascular exam Rhythm:Regular Rate:Normal     Neuro/Psych  Neuromuscular disease (peripheral neuropathy) negative psych ROS   GI/Hepatic negative GI ROS, Neg liver ROS,   Endo/Other  negative endocrine ROS  Renal/GU negative Renal ROS  negative genitourinary   Musculoskeletal negative musculoskeletal ROS (+)   Abdominal   Peds negative pediatric ROS (+)  Hematology negative hematology ROS (+)   Anesthesia Other Findings   Reproductive/Obstetrics negative OB ROS                            Anesthesia Physical Anesthesia Plan  ASA: II  Anesthesia Plan: MAC   Post-op Pain Management:    Induction: Intravenous  PONV Risk Score and Plan: 2 and Propofol infusion, TIVA and Treatment may vary due to age or medical condition  Airway Management Planned:   Additional Equipment: None  Intra-op Plan:   Post-operative Plan:   Informed Consent: I have reviewed the patients History and Physical, chart, labs and discussed the procedure including the risks, benefits and alternatives for the proposed anesthesia with the patient or authorized representative who has indicated his/her understanding and acceptance.       Plan Discussed with: Anesthesiologist and CRNA  Anesthesia Plan Comments:          Anesthesia Quick Evaluation

## 2020-11-13 NOTE — Discharge Instructions (Signed)

## 2020-11-13 NOTE — Op Note (Signed)
[facility] Patient Name: Kimberly [name] Procedure Date: 11/13/2020 MRN: [id number] Attending MD: Carol Ada , MD Date of Birth: 07/11/1941 CSN: [id number] Age: 80 Admit Type: Outpatient Procedure:                Upper GI endoscopy Indications:              Heartburn Providers:                Carol Ada, MD, Cleda Daub, RN, Lesia Sago, Technician, Stephanie [place] CRNA Referring MD:              Medicines:                Propofol per Anesthesia Complications:            No immediate complications. Estimated Blood Loss:     Estimated blood loss: none. Procedure:                Pre-Anesthesia Assessment:                           - Prior to the procedure, a History and Physical                            was performed, and patient medications and                            allergies were reviewed. The patient's tolerance of                            previous anesthesia was also reviewed. The risks                            and benefits of the procedure and the sedation                            options and risks were discussed with the patient.                            All questions were answered, and informed consent                            was obtained. Prior Anticoagulants: The patient has                            taken no previous anticoagulant or antiplatelet                            agents. ASA Grade Assessment: III - A patient with                            severe systemic disease. After reviewing the risks  and benefits, the patient was deemed in                            satisfactory condition to undergo the procedure.                           - Sedation was administered by an anesthesia                            professional. Deep sedation was attained.                           After obtaining informed consent, the endoscope was                            passed under direct  vision. Throughout the                            procedure, the patient's blood pressure, pulse, and                            oxygen saturations were monitored continuously. The                            PCF-H190DL ([id number] Olympus pediatric colonscope                            was introduced through the mouth, and advanced to                            the second part of duodenum. The upper GI endoscopy                            was accomplished without difficulty. The patient                            tolerated the procedure well. Scope In: Scope Out: Findings:      A 3 cm hiatal hernia was present.      The stomach was normal.      The examined duodenum was normal. Impression:               - 3 cm hiatal hernia.                           - Normal stomach.                           - Normal examined duodenum.                           - No specimens collected. Moderate Sedation:      Not Applicable - Patient had care per Anesthesia. Recommendation:           - Patient has a contact number available for  emergencies. The signs and symptoms of potential                            delayed complications were discussed with the                            patient. Return to normal activities tomorrow.                            Written discharge instructions were provided to the                            patient.                           - Resume previous diet.                           - Continue present medications.                           - ? Trial of a PPI.                           - Follow up with Dr. Collene Mares in 4 weeks. Procedure Code(s):        --- Professional ---                           [phone], Esophagogastroduodenoscopy, flexible,                            transoral; diagnostic, including collection of                            specimen(s) by brushing or washing, when performed                            (separate procedure) Diagnosis  Code(s):        --- Professional ---                           R12, Heartburn                           K44.9, Diaphragmatic hernia without obstruction or                            gangrene CPT copyright 2019 American [employer] Association. All rights reserved. The codes documented in this report are preliminary and upon coder review may  be revised to meet current compliance requirements. Carol Ada, MD Carol Ada, MD 11/13/2020 8:59:36 AM This report has been signed electronically. Number of Addenda: 0

## 2020-11-13 NOTE — Transfer of Care (Signed)
Immediate Anesthesia Transfer of Care Note  Patient: Kimberly [name]  Procedure(s) Performed: ESOPHAGOGASTRODUODENOSCOPY (EGD) WITH PROPOFOL (N/A ) COLONOSCOPY WITH PROPOFOL (N/A ) POLYPECTOMY  Patient Location: PACU and Endoscopy Unit  Anesthesia Type:MAC  Level of Consciousness: awake and drowsy  Airway & Oxygen Therapy: Patient Spontanous Breathing and Patient connected to face mask oxygen  Post-op Assessment: Report given to RN and Post -op Vital signs reviewed and stable  Post vital signs: Reviewed and stable  Last Vitals:  Vitals Value Taken Time  BP 137/54 11/13/20 0900  Temp    Pulse 92 11/13/20 0901  Resp 14 11/13/20 0901  SpO2 100 % 11/13/20 0901  Vitals shown include unvalidated device data.  Last Pain:  Vitals:   11/13/20 0759  TempSrc: Oral  PainSc: 0-No pain         Complications: No complications documented.

## 2020-11-13 NOTE — Op Note (Signed)
[facility] Patient Name: Kimberly [name] Procedure Date: 11/13/2020 MRN: [id number] Attending MD: Carol Ada , MD Date of Birth: [birth year] CSN: [id number] Age: 80 Admit Type: Outpatient Procedure:                Colonoscopy Indications:              Abdominal pain in the left lower quadrant,                            Follow-up of diverticulitis Providers:                Carol Ada, MD, Cleda Daub, RN, Lesia Sago, Technician, Stephanie [place] CRNA Referring MD:              Medicines:                 Complications:            No immediate complications. Estimated Blood Loss:     Estimated blood loss: none. Procedure:                Pre-Anesthesia Assessment:                           - Prior to the procedure, a History and Physical                            was performed, and patient medications and                            allergies were reviewed. The patient's tolerance of                            previous anesthesia was also reviewed. The risks                            and benefits of the procedure and the sedation                            options and risks were discussed with the patient.                            All questions were answered, and informed consent                            was obtained. Prior Anticoagulants: The patient has                            taken no previous anticoagulant or antiplatelet                            agents. ASA Grade Assessment: III - A patient with                            severe  systemic disease. After reviewing the risks                            and benefits, the patient was deemed in                            satisfactory condition to undergo the procedure.                           - Sedation was administered by an anesthesia                            professional. Deep sedation was attained.                           After obtaining informed consent, the  colonoscope                            was passed under direct vision. Throughout the                            procedure, the patient's blood pressure, pulse, and                            oxygen saturations were monitored continuously. The                            PCF-H190DL ([id number]) Olympus pediatric colonscope                            was introduced through the anus and advanced to the                            the cecum, identified by appendiceal orifice and                            ileocecal valve. The colonoscopy was performed                            without difficulty. The patient tolerated the                            procedure well. The quality of the bowel                            preparation was excellent. The ileocecal valve,                            appendiceal orifice, and rectum were photographed. Scope In: 8:32:08 AM Scope Out: 8:53:51 AM Scope Withdrawal Time: 0 hours 16 minutes 54 seconds  Total Procedure Duration: 0 hours 21 minutes 43 seconds  Findings:      A 4 mm polyp was found in the descending colon. The polyp was       pedunculated. The polyp was removed with a  hot snare. Resection and       retrieval were complete.      Scattered small and large-mouthed diverticula were found in the sigmoid       colon. Impression:               - One 4 mm polyp in the descending colon, removed                            with a hot snare. Resected and retrieved.                           - Diverticulosis in the sigmoid colon. Moderate Sedation:      Not Applicable - Patient had care per Anesthesia. Recommendation:           - Patient has a contact number available for                            emergencies. The signs and symptoms of potential                            delayed complications were discussed with the                            patient. Return to normal activities tomorrow.                            Written discharge instructions were  provided to the                            patient.                           - Resume previous diet.                           - Continue present medications.                           - Await pathology results.                           - Repeat colonoscopy is not recommended for                            surveillance. Her mother was reported to have colon                            cancer at an early age. Given the current findings,                            and her age, it appears that her risk for                            developing this disease is low. Procedure Code(s):        --- Professional ---  45385, Colonoscopy, flexible; with removal of                            tumor(s), polyp(s), or other lesion(s) by snare                            technique Diagnosis Code(s):        --- Professional ---                           K63.5, Polyp of colon                           R10.32, Left lower quadrant pain                           K57.32, Diverticulitis of large intestine without                            perforation or abscess without bleeding                           K57.30, Diverticulosis of large intestine without                            perforation or abscess without bleeding CPT copyright 2019 American Medical Association. All rights reserved. The codes documented in this report are preliminary and upon coder review may  be revised to meet current compliance requirements. Carol Ada, MD Carol Ada, MD 11/13/2020 9:04:21 AM This report has been signed electronically. Number of Addenda: 0

## 2020-11-13 NOTE — Anesthesia Postprocedure Evaluation (Signed)
Anesthesia Post Note  Patient: Kimberly [name]  Procedure(s) Performed: ESOPHAGOGASTRODUODENOSCOPY (EGD) WITH PROPOFOL (N/A ) COLONOSCOPY WITH PROPOFOL (N/A ) POLYPECTOMY     Patient location during evaluation: Endoscopy Anesthesia Type: MAC Level of consciousness: awake and alert Pain management: pain level controlled Vital Signs Assessment: post-procedure vital signs reviewed and stable Respiratory status: spontaneous breathing, nonlabored ventilation and respiratory function stable Cardiovascular status: blood pressure returned to baseline and stable Postop Assessment: no apparent nausea or vomiting Anesthetic complications: no   No complications documented.  Last Vitals:  Vitals:   11/13/20 0913 11/13/20 0920  BP: (!) 168/60 (!) 158/70  Pulse: 86 83  Resp: 20 15  Temp:    SpO2: 97% 98%    Last Pain:  Vitals:   11/13/20 0920  TempSrc:   PainSc: 0-No pain                 Merlinda Frederick

## 2020-11-13 NOTE — H&P (Signed)
Kimberly [name]   HPI: Kimberly [name] is an 80 y. o. female in no acute distress who presents today with chief complaint of diverticulitis. She is accompanied by her daughter for appointment today. Patient reports 2 episodes of constant, sharp, and severe left sided abdominal pain for which she went to the emergency room in December 2021 and January 2022. While at the ER on 09/03/2020, CT scan was positive for a sigmoid colon diverticulitis. She denies radiation of pain, vomiting, fever, chills, constipation, diarrhea, melena, or hematochezia. She also denies any aggravating factors, such as new dietary changes. Pt reports receiving antibiotics while in the emergency department that helped to relieve the pain. Patient also complains of coughing at lunchtime and regurgitation of food on occasion. She does not recall the year of her last colonoscopy, however, she states that a polyp was removed. She does have a family history positive for colon cancer and diverticulitis in her mother. Her mother was diagnosed with colon cancer between the age of 1-45 y. o. patient is unable to recall the exact age.     Past Medical History:  Diagnosis Date  . Complication of anesthesia    Pt reports that she was told "someone had to breath for her" She is not clear what this means  . Diverticulitis   . History of COVID-19 05/2020  . Hypercholesteremia   . Peripheral neuropathy   . Restless leg syndrome     Past Surgical History:  Procedure Laterality Date  . ABDOMINAL HYSTERECTOMY     Age 33  . BACK SURGERY    . EYE SURGERY     bilateral cateract   . TONSILLECTOMY    . 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 Breast cancer Sister   . Other Mother        passed away at age 69  . Heart attack Father        passed  away at age 71    Social History:  reports that she has never smoked. She has never used smokeless tobacco. She reports that she does not drink alcohol and does not use drugs.  Allergies:  Allergies  Allergen Reactions  . Amoxicillin Swelling    Throat felt swollen Did it involve swelling of the face/tongue/throat, SOB, or low BP? Yes Did it involve sudden or severe rash/hives, skin peeling, or any reaction on the inside of your mouth or nose? No Did you need to seek medical attention at a hospital or doctor's office? No When did it last happen?More than 30 years ago If all above answers are "NO", may proceed with cephalosporin use.   . Ciprofloxacin Itching  . Adhesive [Tape] Other (See Comments)    Redness/itching.  . Benzoyl Peroxide Itching  . Hydrocodone-Acetaminophen Itching  . Metronidazole Hives    Other reaction(s): hives  . Oxycodone Other (See Comments)    disoriented  . Sulfa Antibiotics Rash    And itching    Medications: Scheduled: Continuous:  No results found for this or any previous visit (from the past 24 hour(s)).   No results found.  ROS:  As stated above in the HPI otherwise negative.  Height 5\' 4"  (1.626 m), weight 74.8 kg.    PE: Gen: NAD, Alert and Oriented HEENT:  [place]/AT, EOMI Neck:  Supple, no LAD Lungs: CTA Bilaterally CV: RRR without M/G/R ABD: Soft, NTND, +BS Ext: No C/C/E  Assessment/Plan: 1) Abdominal pain. 2) Recent history of diverticulitis. 3) Family history of colon cancer. 4) GERD.  Plan: 1) EGD/colonoscopy  HUNG,PATRICK D 11/13/2020, 7:20 AM

## 2020-11-16 ENCOUNTER — Encounter (HOSPITAL_COMMUNITY): Payer: Self-pay | Admitting: Gastroenterology

## 2020-11-16 LAB — SURGICAL PATHOLOGY

## 2020-11-26 ENCOUNTER — Inpatient Hospital Stay: Admission: RE | Admit: 2020-11-26 | Payer: Medicare Other | Source: Ambulatory Visit

## 2020-11-26 DIAGNOSIS — J302 Other seasonal allergic rhinitis: Secondary | ICD-10-CM | POA: Diagnosis not present

## 2020-11-26 DIAGNOSIS — J3489 Other specified disorders of nose and nasal sinuses: Secondary | ICD-10-CM | POA: Diagnosis not present

## 2020-11-26 DIAGNOSIS — R059 Cough, unspecified: Secondary | ICD-10-CM | POA: Diagnosis not present

## 2020-12-16 DIAGNOSIS — I10 Essential (primary) hypertension: Secondary | ICD-10-CM | POA: Diagnosis not present

## 2020-12-16 DIAGNOSIS — K219 Gastro-esophageal reflux disease without esophagitis: Secondary | ICD-10-CM | POA: Diagnosis not present

## 2020-12-16 DIAGNOSIS — R933 Abnormal findings on diagnostic imaging of other parts of digestive tract: Secondary | ICD-10-CM | POA: Diagnosis not present

## 2021-01-15 ENCOUNTER — Ambulatory Visit: Payer: Medicare Other

## 2021-01-16 ENCOUNTER — Ambulatory Visit
Admission: RE | Admit: 2021-01-16 | Discharge: 2021-01-16 | Disposition: A | Discharge status: Home / Self Care | Payer: Medicare Other | Source: Ambulatory Visit | Attending: Internal Medicine | Admitting: Internal Medicine

## 2021-01-16 ENCOUNTER — Other Ambulatory Visit: Payer: Self-pay

## 2021-01-16 DIAGNOSIS — Z1231 Encounter for screening mammogram for malignant neoplasm of breast: Secondary | ICD-10-CM

## 2021-02-22 DIAGNOSIS — E7800 Pure hypercholesterolemia, unspecified: Secondary | ICD-10-CM | POA: Diagnosis not present

## 2021-02-22 DIAGNOSIS — Z0000 Encounter for general adult medical examination without abnormal findings: Secondary | ICD-10-CM | POA: Diagnosis not present

## 2021-02-22 DIAGNOSIS — I10 Essential (primary) hypertension: Secondary | ICD-10-CM | POA: Diagnosis not present

## 2021-02-22 DIAGNOSIS — Z125 Encounter for screening for malignant neoplasm of prostate: Secondary | ICD-10-CM | POA: Diagnosis not present

## 2021-02-25 DIAGNOSIS — M810 Age-related osteoporosis without current pathological fracture: Secondary | ICD-10-CM | POA: Diagnosis not present

## 2021-02-25 DIAGNOSIS — F5101 Primary insomnia: Secondary | ICD-10-CM | POA: Diagnosis not present

## 2021-02-25 DIAGNOSIS — G629 Polyneuropathy, unspecified: Secondary | ICD-10-CM | POA: Diagnosis not present

## 2021-02-25 DIAGNOSIS — Z810 Family history of intellectual disabilities: Secondary | ICD-10-CM | POA: Diagnosis not present

## 2021-02-25 DIAGNOSIS — Z0000 Encounter for general adult medical examination without abnormal findings: Secondary | ICD-10-CM | POA: Diagnosis not present

## 2021-02-25 DIAGNOSIS — E7800 Pure hypercholesterolemia, unspecified: Secondary | ICD-10-CM | POA: Diagnosis not present

## 2021-02-25 DIAGNOSIS — G2581 Restless legs syndrome: Secondary | ICD-10-CM | POA: Diagnosis not present

## 2021-02-25 DIAGNOSIS — I10 Essential (primary) hypertension: Secondary | ICD-10-CM | POA: Diagnosis not present

## 2021-02-25 DIAGNOSIS — R7303 Prediabetes: Secondary | ICD-10-CM | POA: Diagnosis not present

## 2021-03-03 DIAGNOSIS — M810 Age-related osteoporosis without current pathological fracture: Secondary | ICD-10-CM | POA: Diagnosis not present

## 2021-03-05 DIAGNOSIS — Z96652 Presence of left artificial knee joint: Secondary | ICD-10-CM | POA: Diagnosis not present

## 2021-03-05 DIAGNOSIS — M25561 Pain in right knee: Secondary | ICD-10-CM | POA: Diagnosis not present

## 2021-03-05 DIAGNOSIS — M1711 Unilateral primary osteoarthritis, right knee: Secondary | ICD-10-CM | POA: Diagnosis not present

## 2021-03-19 DIAGNOSIS — M19072 Primary osteoarthritis, left ankle and foot: Secondary | ICD-10-CM | POA: Diagnosis not present

## 2021-03-19 DIAGNOSIS — M25572 Pain in left ankle and joints of left foot: Secondary | ICD-10-CM | POA: Diagnosis not present

## 2021-03-26 DIAGNOSIS — M25572 Pain in left ankle and joints of left foot: Secondary | ICD-10-CM | POA: Diagnosis not present

## 2021-03-31 DIAGNOSIS — M25572 Pain in left ankle and joints of left foot: Secondary | ICD-10-CM | POA: Diagnosis not present

## 2021-04-07 DIAGNOSIS — M25572 Pain in left ankle and joints of left foot: Secondary | ICD-10-CM | POA: Diagnosis not present

## 2021-04-14 DIAGNOSIS — M25572 Pain in left ankle and joints of left foot: Secondary | ICD-10-CM | POA: Diagnosis not present

## 2021-04-19 DIAGNOSIS — G5601 Carpal tunnel syndrome, right upper limb: Secondary | ICD-10-CM | POA: Diagnosis not present

## 2021-04-19 DIAGNOSIS — M25512 Pain in left shoulder: Secondary | ICD-10-CM | POA: Diagnosis not present

## 2021-04-19 DIAGNOSIS — M79642 Pain in left hand: Secondary | ICD-10-CM | POA: Diagnosis not present

## 2021-04-19 DIAGNOSIS — M25511 Pain in right shoulder: Secondary | ICD-10-CM | POA: Diagnosis not present

## 2021-04-21 DIAGNOSIS — M25572 Pain in left ankle and joints of left foot: Secondary | ICD-10-CM | POA: Diagnosis not present

## 2021-05-05 DIAGNOSIS — M25572 Pain in left ankle and joints of left foot: Secondary | ICD-10-CM | POA: Diagnosis not present

## 2021-05-13 ENCOUNTER — Other Ambulatory Visit: Payer: Self-pay

## 2021-05-13 ENCOUNTER — Ambulatory Visit (INDEPENDENT_AMBULATORY_CARE_PROVIDER_SITE_OTHER): Payer: Medicare Other

## 2021-05-13 ENCOUNTER — Encounter: Payer: Self-pay | Admitting: Orthopedic Surgery

## 2021-05-13 ENCOUNTER — Ambulatory Visit (INDEPENDENT_AMBULATORY_CARE_PROVIDER_SITE_OTHER): Payer: Medicare Other | Admitting: Orthopedic Surgery

## 2021-05-13 DIAGNOSIS — M25572 Pain in left ankle and joints of left foot: Secondary | ICD-10-CM

## 2021-05-13 DIAGNOSIS — M6702 Short Achilles tendon (acquired), left ankle: Secondary | ICD-10-CM | POA: Diagnosis not present

## 2021-05-13 DIAGNOSIS — S8265XA Nondisplaced fracture of lateral malleolus of left fibula, initial encounter for closed fracture: Secondary | ICD-10-CM | POA: Diagnosis not present

## 2021-05-13 NOTE — Progress Notes (Signed)
Office Visit Note   Patient: Kimberly [name]           Date of Birth: 05/22/1941           MRN: [id number] Visit Date: 05/13/2021              Requested by: Jani Gravel, [address] [place],  [place] [address] PCP: Jani Gravel, MD  Chief Complaint  Patient presents with   Left Ankle - Pain      HPI: Patient is an 80 year old woman with a history of osteoporosis she is on Prolia injections.  Patient denies any fall or traumatic event but states that she has had acute pain over the distal lateral fibula.  Patient has had a history of metatarsal fractures remotely.  Patient complains of neuropathy pain and she is on Neurontin and Cymbalta.  Assessment & Plan: Visit Diagnoses:  1. Pain in left ankle and joints of left foot   2. Nondisplaced fracture of lateral malleolus of left fibula, initial encounter for closed fracture     Plan: We will place patient in a fracture boot anticipate the nondisplaced fibular fracture should heal without surgical intervention.  She is given 1/2 inch heel lift to place in her boot to unload the forefoot due to the Achilles contracture.  Patient states that she has to fracture boots at home and she will try using one of her fracture boots.  Three-view radiographs of the left ankle at follow-up.  Follow-Up Instructions: Return in about 3 weeks (around 06/03/2021).   Ortho Exam  Patient is alert, oriented, no adenopathy, well-dressed, normal affect, normal respiratory effort. Examination patient has good pulses she does have Achilles contracture with dorsiflexion about 10 degrees past neutral with her knee extended.  She has thin atrophic skin over the ball of her foot it appears that her neuropathy pain is overloading the forefoot secondary to the chronic Achilles contracture.  Patient is point tender to palpation over the distal fibula just proximal to the lateral malleolus.  This corresponds to the Weber C fracture site.  There is  no ecchymosis and bruising in the skin.  The deltoid and lateral ankle ligaments are nontender to palpation.  The anterior joint line is nontender to palpation.  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  Patient has a nondisplaced Weber C fibular fracture.  There are loose bodies anteriorly to the joint line.  No images are attached to the encounter.  Labs: Lab Results  Component Value Date   HGBA1C 5.6 07/04/2017   ESRSEDRATE 4 07/04/2017   CRP 1.1 07/04/2017     Lab Results  Component Value Date   ALBUMIN 3.8 09/03/2020   ALBUMIN 4.3 07/04/2017    No results found for: MG Lab Results  Component Value Date   VD25OH 66.9 07/04/2017    No results found for: PREALBUMIN CBC EXTENDED Latest Ref Rng & Units 09/03/2020 12/04/2019 11/25/2019  WBC 4.0 - 10.5 K/uL 12.1(H) 11.7(H) 6.6  RBC 3.87 - 5.11 MIL/uL 4.06 4.00 4.58  HGB 12.0 - 15.0 g/dL 12.7 12.1 13.9  HCT 36.0 - 46.0 % 38.7 38.3 43.3  PLT 150 - 400 K/uL 302 252 322  NEUTROABS 1.7 - 7.7 K/uL 9.0(H) - -  LYMPHSABS 0.7 - 4.0 K/uL 1.5 - -     There is no  height or weight on file to calculate BMI.  Orders:  Orders Placed This Encounter  Procedures   XR Ankle 2 Views Left   No orders of the defined types were placed in this encounter.    Procedures: No procedures performed  Clinical Data: No additional findings.  ROS:  All other systems negative, except as noted in the HPI. Review of Systems  Objective: Vital Signs: There were no vitals taken for this visit.  Specialty Comments:  No specialty comments available.  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   Chronic low back pain 03/25/2019   Gait abnormality 05/07/2018   Restless leg syndrome 07/28/2017   Paresthesia 07/04/2017   Restless leg 07/04/2017    Past Medical History:  Diagnosis Date   Complication of anesthesia    Pt reports that she was told "someone had to breath for her" She is not clear what this means   Difficult intubation    Diverticulitis    History of COVID-19 05/2020   Hypercholesteremia    Peripheral neuropathy    Restless leg syndrome     Family History  Problem Relation Age of Onset   Breast cancer Sister    Other Mother        passed away at age 43   Heart attack Father        passed away at age 92    Past Surgical History:  Procedure Laterality Date   ABDOMINAL HYSTERECTOMY     Age 1   BACK SURGERY     COLONOSCOPY WITH PROPOFOL N/A 11/13/2020   Procedure: COLONOSCOPY WITH PROPOFOL;  Surgeon: Carol Ada, MD;  Location: [facility];  Service: Endoscopy;  Laterality: N/A;   ESOPHAGOGASTRODUODENOSCOPY (EGD) WITH PROPOFOL N/A 11/13/2020   Procedure: ESOPHAGOGASTRODUODENOSCOPY (EGD) WITH PROPOFOL;  Surgeon: Carol Ada, MD;  Location: [facility];  Service: Endoscopy;  Laterality: N/A;   EYE SURGERY     bilateral cateract    POLYPECTOMY  11/13/2020   Procedure: POLYPECTOMY;  Surgeon: Carol Ada, MD;  Location: [facility];  Service: Endoscopy;;   TONSILLECTOMY     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

## 2021-06-01 ENCOUNTER — Ambulatory Visit: Payer: Self-pay

## 2021-06-01 ENCOUNTER — Ambulatory Visit (INDEPENDENT_AMBULATORY_CARE_PROVIDER_SITE_OTHER): Payer: Medicare Other | Admitting: Orthopedic Surgery

## 2021-06-01 DIAGNOSIS — S8265XA Nondisplaced fracture of lateral malleolus of left fibula, initial encounter for closed fracture: Secondary | ICD-10-CM | POA: Diagnosis not present

## 2021-06-13 ENCOUNTER — Encounter: Payer: Self-pay | Admitting: Orthopedic Surgery

## 2021-06-13 NOTE — Progress Notes (Signed)
Office Visit Note   Patient: Kimberly [name]           Date of Birth: [birth year]           MRN: [id number] Visit Date: 06/01/2021              Requested by: Jani Gravel, [address] [place],  [place] [address] PCP: Jani Gravel, MD  Chief Complaint  Patient presents with   Left Ankle - Fracture      HPI: Patient is an 80 year old woman who presents in follow-up for a nondisplaced Weber C lateral malleolar fracture left ankle.  Assessment & Plan: Visit Diagnoses:  1. Nondisplaced fracture of lateral malleolus of left fibula, initial encounter for closed fracture     Plan: We will continue with conservative treatment.  Continue with the fracture boot weightbearing as tolerated.  Three-view radiographs of the left ankle at follow-up.  Follow-Up Instructions: Return in about 3 weeks (around 06/22/2021).   Ortho Exam  Patient is alert, oriented, no adenopathy, well-dressed, normal affect, normal respiratory effort. Examination the radiographs patient has callus formation around the nondisplaced Weber C fibular fracture.  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  Patient has pain to palpation over the Achilles insertion no tenderness to palpation over the fibula.  Imaging: No results found. No images are attached to the encounter.  Labs: Lab Results  Component Value Date   HGBA1C 5.6 07/04/2017   ESRSEDRATE 4 07/04/2017   CRP 1.1 07/04/2017     Lab Results  Component Value Date   ALBUMIN 3.8 09/03/2020   ALBUMIN 4.3 07/04/2017    No results found for: MG Lab Results  Component Value Date   VD25OH 66.9 07/04/2017    No results found for: PREALBUMIN CBC EXTENDED Latest Ref Rng & Units 09/03/2020 12/04/2019 11/25/2019  WBC 4.0 - 10.5 K/uL 12.1(H) 11.7(H) 6.6  RBC 3.87 - 5.11 MIL/uL 4.06 4.00 4.58  HGB 12.0 - 15.0 g/dL 12.7 12.1 13.9  HCT 36.0 -  46.0 % 38.7 38.3 43.3  PLT 150 - 400 K/uL 302 252 322  NEUTROABS 1.7 - 7.7 K/uL 9.0(H) - -  LYMPHSABS 0.7 - 4.0 K/uL 1.5 - -     There is no height or weight on file to calculate BMI.  Orders:  Orders Placed This Encounter  Procedures   XR Ankle Complete Left   No orders of the defined types were placed in this encounter.    Procedures: No procedures performed  Clinical Data: No additional findings.  ROS:  All other systems negative, except as noted in the HPI. Review of Systems  Objective: Vital Signs: There were no vitals taken for this visit.  Specialty Comments:  No specialty comments available.  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   Chronic low back pain 03/25/2019   Gait abnormality 05/07/2018   Restless leg syndrome 07/28/2017   Paresthesia 07/04/2017   Restless leg 07/04/2017   Past Medical History:  Diagnosis Date   Complication of anesthesia    Pt reports that she was told "someone had to breath for her" She is not clear what this means   Difficult intubation    Diverticulitis    History of COVID-19 05/2020   Hypercholesteremia    Peripheral neuropathy    Restless leg syndrome  Family History  Problem Relation Age of Onset   Breast cancer Sister    Other Mother        passed away at age 35   Heart attack Father        passed away at age 80    Past Surgical History:  Procedure Laterality Date   ABDOMINAL HYSTERECTOMY     Age 49   BACK SURGERY     COLONOSCOPY WITH PROPOFOL N/A 11/13/2020   Procedure: COLONOSCOPY WITH PROPOFOL;  Surgeon: Carol Ada, MD;  Location: [facility];  Service: Endoscopy;  Laterality: N/A;   ESOPHAGOGASTRODUODENOSCOPY (EGD) WITH PROPOFOL N/A 11/13/2020   Procedure: ESOPHAGOGASTRODUODENOSCOPY (EGD) WITH PROPOFOL;  Surgeon: Carol Ada, MD;  Location: [facility];  Service: Endoscopy;  Laterality: N/A;   EYE SURGERY     bilateral cateract     POLYPECTOMY  11/13/2020   Procedure: POLYPECTOMY;  Surgeon: Carol Ada, MD;  Location: [facility];  Service: Endoscopy;;   TONSILLECTOMY     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

## 2021-06-22 ENCOUNTER — Ambulatory Visit: Payer: Self-pay

## 2021-06-22 ENCOUNTER — Encounter: Payer: Self-pay | Admitting: Orthopedic Surgery

## 2021-06-22 ENCOUNTER — Ambulatory Visit (INDEPENDENT_AMBULATORY_CARE_PROVIDER_SITE_OTHER): Payer: Medicare Other | Admitting: Orthopedic Surgery

## 2021-06-22 DIAGNOSIS — S8265XA Nondisplaced fracture of lateral malleolus of left fibula, initial encounter for closed fracture: Secondary | ICD-10-CM

## 2021-06-22 NOTE — Progress Notes (Signed)
Office Visit Note   Patient: Kimberly [name]           Date of Birth: 03-10-1941           MRN: [id number] Visit Date: 06/22/2021              Requested by: Jani Gravel, [address] [place],  [place] [address] PCP: Jani Gravel, MD  Chief Complaint  Patient presents with   Left Ankle - Follow-up    Nondisplaced Weber C fx      HPI: Patient is an 80 year old woman who presents in follow-up for a nondisplaced Weber C lateral fibular fracture.  Patient complains of pain over the Achilles.  She is still wearing a fracture boot.  Assessment & Plan: Visit Diagnoses:  1. Nondisplaced fracture of lateral malleolus of left fibula, initial encounter for closed fracture     Plan: Continue with the heel left recommended Voltaren gel to help with the Achilles tendinitis.  Since the fracture site is still symptomatic recommend continuing the fracture boot and repeat three-view radiographs of the left ankle at follow-up.  Follow-Up Instructions: Return in about 3 weeks (around 07/13/2021).   Ortho Exam  Patient is alert, oriented, no adenopathy, well-dressed, normal affect, normal respiratory effort. Examination patient has good pulses she has a little bit of tenderness to palpation along the Achilles there is no palpable defect.  The fracture site has swelling laterally and is tender to palpation.  Imaging: XR Ankle Complete Left  Result Date: 06/22/2021 Three-view radiographs of the left foot shows valgus alignment with bone-on-bone contact tibial talar joint laterally.  The Weber C fibular fracture shows increased callus formation.  There is no displacement no shortening.  No images are attached to the encounter.  Labs: Lab Results  Component Value Date   HGBA1C 5.6 07/04/2017   ESRSEDRATE 4 07/04/2017   CRP 1.1 07/04/2017     Lab Results  Component Value Date   ALBUMIN 3.8 09/03/2020   ALBUMIN 4.3 07/04/2017    No results found for: MG Lab Results   Component Value Date   VD25OH 66.9 07/04/2017    No results found for: PREALBUMIN CBC EXTENDED Latest Ref Rng & Units 09/03/2020 12/04/2019 11/25/2019  WBC 4.0 - 10.5 K/uL 12.1(H) 11.7(H) 6.6  RBC 3.87 - 5.11 MIL/uL 4.06 4.00 4.58  HGB 12.0 - 15.0 g/dL 12.7 12.1 13.9  HCT 36.0 - 46.0 % 38.7 38.3 43.3  PLT 150 - 400 K/uL 302 252 322  NEUTROABS 1.7 - 7.7 K/uL 9.0(H) - -  LYMPHSABS 0.7 - 4.0 K/uL 1.5 - -     There is no height or weight on file to calculate BMI.  Orders:  Orders Placed This Encounter  Procedures   XR Ankle Complete Left   No orders of the defined types were placed in this encounter.    Procedures: No procedures performed  Clinical Data: No additional findings.  ROS:  All other systems negative, except as noted in the HPI. Review of Systems  Objective: Vital Signs: There were no vitals taken for this visit.  Specialty Comments:  No specialty comments available.  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   Chronic low back pain 03/25/2019   Gait abnormality 05/07/2018   Restless leg syndrome 07/28/2017   Paresthesia 07/04/2017   Restless leg 07/04/2017   Past Medical History:  Diagnosis Date   Complication of anesthesia  Pt reports that she was told "someone had to breath for her" She is not clear what this means   Difficult intubation    Diverticulitis    History of COVID-19 05/2020   Hypercholesteremia    Peripheral neuropathy    Restless leg syndrome     Family History  Problem Relation Age of Onset   Breast cancer Sister    Other Mother        passed away at age 81   Heart attack Father        passed away at age 45    Past Surgical History:  Procedure Laterality Date   ABDOMINAL HYSTERECTOMY     Age 79   BACK SURGERY     COLONOSCOPY WITH PROPOFOL N/A 11/13/2020   Procedure: COLONOSCOPY WITH PROPOFOL;  Surgeon: Carol Ada, MD;  [facility];  Service: Endoscopy;   Laterality: N/A;   ESOPHAGOGASTRODUODENOSCOPY (EGD) WITH PROPOFOL N/A 11/13/2020   Procedure: ESOPHAGOGASTRODUODENOSCOPY (EGD) WITH PROPOFOL;  Surgeon: Carol Ada, MD;  [facility];  Service: Endoscopy;  Laterality: N/A;   EYE SURGERY     bilateral cateract    POLYPECTOMY  11/13/2020   Procedure: POLYPECTOMY;  Surgeon: Carol Ada, MD;  Location: [facility];  Service: Endoscopy;;   TONSILLECTOMY     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

## 2021-07-05 DIAGNOSIS — K5732 Diverticulitis of large intestine without perforation or abscess without bleeding: Secondary | ICD-10-CM | POA: Diagnosis not present

## 2021-07-05 DIAGNOSIS — R1032 Left lower quadrant pain: Secondary | ICD-10-CM | POA: Diagnosis not present

## 2021-07-09 DIAGNOSIS — M25561 Pain in right knee: Secondary | ICD-10-CM | POA: Diagnosis not present

## 2021-07-09 DIAGNOSIS — Z96652 Presence of left artificial knee joint: Secondary | ICD-10-CM | POA: Diagnosis not present

## 2021-07-09 DIAGNOSIS — M1711 Unilateral primary osteoarthritis, right knee: Secondary | ICD-10-CM | POA: Diagnosis not present

## 2021-07-13 ENCOUNTER — Other Ambulatory Visit: Payer: Self-pay

## 2021-07-13 ENCOUNTER — Ambulatory Visit (INDEPENDENT_AMBULATORY_CARE_PROVIDER_SITE_OTHER): Payer: Medicare Other | Admitting: Orthopedic Surgery

## 2021-07-13 ENCOUNTER — Ambulatory Visit (INDEPENDENT_AMBULATORY_CARE_PROVIDER_SITE_OTHER): Payer: Medicare Other

## 2021-07-13 DIAGNOSIS — S8265XA Nondisplaced fracture of lateral malleolus of left fibula, initial encounter for closed fracture: Secondary | ICD-10-CM | POA: Diagnosis not present

## 2021-07-30 ENCOUNTER — Encounter: Payer: Self-pay | Admitting: Orthopedic Surgery

## 2021-07-30 NOTE — Progress Notes (Signed)
Office Visit Note   Patient: Kimberly [name]           Date of Birth: 02/04/1941           MRN: [id number] Visit Date: 07/13/2021              Requested by: Jani Gravel, [address] [place],  [place] [address] PCP: Jani Gravel, MD  Chief Complaint  Patient presents with   Left Ankle - Fracture, Follow-up      HPI: Patient is a 80 year old woman who is 2 months status post nondisplaced Weber C fibular fracture she has been using Voltaren gel for Achilles tendinitis.  Assessment & Plan: Visit Diagnoses:  1. Nondisplaced fracture of lateral malleolus of left fibula, initial encounter for closed fracture     Plan: Continue the fracture boot weightbearing as tolerated  Three-view radiographs of the left ankle at follow-up.  Buddy taping of the right little finger.  Follow-Up Instructions: Return in about 4 weeks (around 08/10/2021).   Ortho Exam  Patient is alert, oriented, no adenopathy, well-dressed, normal affect, normal respiratory effort. Examination patient is ankle is stable the skin is intact no malalignment.  Examination of the right little finger she does have some ulnar deviation secondary to recent blunt trauma.  She has good flexion with all fingers congruent with flexion.  We will have her buddy tape the right little finger.  Imaging: No results found. No images are attached to the encounter.  Labs: Lab Results  Component Value Date   HGBA1C 5.6 07/04/2017   ESRSEDRATE 4 07/04/2017   CRP 1.1 07/04/2017     Lab Results  Component Value Date   ALBUMIN 3.8 09/03/2020   ALBUMIN 4.3 07/04/2017    No results found for: MG Lab Results  Component Value Date   VD25OH 66.9 07/04/2017    No results found for: PREALBUMIN CBC EXTENDED Latest Ref Rng & Units 09/03/2020 12/04/2019 11/25/2019  WBC 4.0 - 10.5 K/uL 12.1(H) 11.7(H) 6.6  RBC 3.87 - 5.11 MIL/uL 4.06 4.00 4.58  HGB 12.0 - 15.0 g/dL 12.7 12.1 13.9  HCT 36.0 - 46.0 % 38.7 38.3  43.3  PLT 150 - 400 K/uL 302 252 322  NEUTROABS 1.7 - 7.7 K/uL 9.0(H) - -  LYMPHSABS 0.7 - 4.0 K/uL 1.5 - -     There is no height or weight on file to calculate BMI.  Orders:  Orders Placed This Encounter  Procedures   XR Ankle Complete Left   No orders of the defined types were placed in this encounter.    Procedures: No procedures performed  Clinical Data: No additional findings.  ROS:  All other systems negative, except as noted in the HPI. Review of Systems  Objective: Vital Signs: There were no vitals taken for this visit.  Specialty Comments:  No specialty comments available.  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   Chronic low back pain 03/25/2019   Gait abnormality 05/07/2018   Restless leg syndrome 07/28/2017   Paresthesia 07/04/2017   Restless leg 07/04/2017   Past Medical History:  Diagnosis Date   Complication of anesthesia    Pt reports that she was told "someone had to breath for her" She is not clear what this means   Difficult intubation    Diverticulitis    History of COVID-19 05/2020   Hypercholesteremia    Peripheral neuropathy    Restless leg  syndrome     Family History  Problem Relation Age of Onset   Breast cancer Sister    Other Mother        passed away at age 77   Heart attack Father        passed away at age 20    Past Surgical History:  Procedure Laterality Date   ABDOMINAL HYSTERECTOMY     Age 2   BACK SURGERY     COLONOSCOPY WITH PROPOFOL N/A 11/13/2020   Procedure: COLONOSCOPY WITH PROPOFOL;  Surgeon: Carol Ada, MD;  [facility];  Service: Endoscopy;  Laterality: N/A;   ESOPHAGOGASTRODUODENOSCOPY (EGD) WITH PROPOFOL N/A 11/13/2020   Procedure: ESOPHAGOGASTRODUODENOSCOPY (EGD) WITH PROPOFOL;  Surgeon: Carol Ada, MD;  Location: [facility];  Service: Endoscopy;  Laterality: N/A;   EYE SURGERY     bilateral cateract    POLYPECTOMY  11/13/2020    Procedure: POLYPECTOMY;  Surgeon: Carol Ada, MD;  [facility];  Service: Endoscopy;;   TONSILLECTOMY     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

## 2021-08-10 ENCOUNTER — Ambulatory Visit: Payer: Medicare Other | Admitting: Orthopedic Surgery

## 2021-08-25 DIAGNOSIS — M810 Age-related osteoporosis without current pathological fracture: Secondary | ICD-10-CM | POA: Diagnosis not present

## 2021-08-25 DIAGNOSIS — E7800 Pure hypercholesterolemia, unspecified: Secondary | ICD-10-CM | POA: Diagnosis not present

## 2021-08-25 DIAGNOSIS — R7303 Prediabetes: Secondary | ICD-10-CM | POA: Diagnosis not present

## 2021-08-31 DIAGNOSIS — I10 Essential (primary) hypertension: Secondary | ICD-10-CM | POA: Diagnosis not present

## 2021-08-31 DIAGNOSIS — R7303 Prediabetes: Secondary | ICD-10-CM | POA: Diagnosis not present

## 2021-08-31 DIAGNOSIS — E559 Vitamin D deficiency, unspecified: Secondary | ICD-10-CM | POA: Diagnosis not present

## 2021-08-31 DIAGNOSIS — G629 Polyneuropathy, unspecified: Secondary | ICD-10-CM | POA: Diagnosis not present

## 2021-08-31 DIAGNOSIS — M159 Polyosteoarthritis, unspecified: Secondary | ICD-10-CM | POA: Diagnosis not present

## 2021-08-31 DIAGNOSIS — E7800 Pure hypercholesterolemia, unspecified: Secondary | ICD-10-CM | POA: Diagnosis not present

## 2021-08-31 DIAGNOSIS — M810 Age-related osteoporosis without current pathological fracture: Secondary | ICD-10-CM | POA: Diagnosis not present

## 2021-08-31 DIAGNOSIS — M549 Dorsalgia, unspecified: Secondary | ICD-10-CM | POA: Diagnosis not present

## 2021-08-31 DIAGNOSIS — G2581 Restless legs syndrome: Secondary | ICD-10-CM | POA: Diagnosis not present

## 2021-09-06 DIAGNOSIS — M810 Age-related osteoporosis without current pathological fracture: Secondary | ICD-10-CM | POA: Diagnosis not present

## 2021-09-22 DIAGNOSIS — M25572 Pain in left ankle and joints of left foot: Secondary | ICD-10-CM | POA: Diagnosis not present

## 2021-09-22 DIAGNOSIS — M19012 Primary osteoarthritis, left shoulder: Secondary | ICD-10-CM | POA: Diagnosis not present

## 2021-09-27 DIAGNOSIS — M25572 Pain in left ankle and joints of left foot: Secondary | ICD-10-CM | POA: Diagnosis not present

## 2021-11-12 DIAGNOSIS — M545 Low back pain, unspecified: Secondary | ICD-10-CM | POA: Diagnosis not present

## 2021-11-12 DIAGNOSIS — M1611 Unilateral primary osteoarthritis, right hip: Secondary | ICD-10-CM | POA: Diagnosis not present

## 2021-11-19 DIAGNOSIS — M5416 Radiculopathy, lumbar region: Secondary | ICD-10-CM | POA: Diagnosis not present

## 2021-11-24 ENCOUNTER — Ambulatory Visit (INDEPENDENT_AMBULATORY_CARE_PROVIDER_SITE_OTHER): Payer: Medicare Other | Admitting: Neurology

## 2021-11-24 ENCOUNTER — Encounter: Payer: Self-pay | Admitting: Neurology

## 2021-11-24 ENCOUNTER — Telehealth: Payer: Self-pay | Admitting: Neurology

## 2021-11-24 VITALS — BP 181/94 | HR 94 | Ht 63.0 in | Wt 168.0 lb

## 2021-11-24 DIAGNOSIS — M79604 Pain in right leg: Secondary | ICD-10-CM

## 2021-11-24 DIAGNOSIS — M25572 Pain in left ankle and joints of left foot: Secondary | ICD-10-CM | POA: Diagnosis not present

## 2021-11-24 DIAGNOSIS — M79605 Pain in left leg: Secondary | ICD-10-CM

## 2021-11-24 DIAGNOSIS — G629 Polyneuropathy, unspecified: Secondary | ICD-10-CM | POA: Insufficient documentation

## 2021-11-24 DIAGNOSIS — M629 Disorder of muscle, unspecified: Secondary | ICD-10-CM | POA: Diagnosis not present

## 2021-11-24 DIAGNOSIS — G3184 Mild cognitive impairment, so stated: Secondary | ICD-10-CM | POA: Diagnosis not present

## 2021-11-24 DIAGNOSIS — G2581 Restless legs syndrome: Secondary | ICD-10-CM | POA: Diagnosis not present

## 2021-11-24 MED ORDER — TRAZODONE HCL 50 MG PO TABS: 100.0000 mg | ORAL_TABLET | Freq: Every day | ORAL | 11 refills | Status: DC

## 2021-11-24 MED ORDER — PREGABALIN 75 MG PO CAPS: ORAL_CAPSULE | ORAL | 5 refills | Status: DC

## 2021-11-24 MED ORDER — DULOXETINE HCL 60 MG PO CPEP: 60.0000 mg | ORAL_CAPSULE | Freq: Every day | ORAL | 3 refills | Status: DC

## 2021-11-24 NOTE — Telephone Encounter (Signed)
[place]icare/[place] of [place] order sent to [facility], [facility] they will reach out to the patient to schedule.  

## 2021-11-24 NOTE — Progress Notes (Signed)
? ?Chief Complaint  ?Patient presents with  ? New Patient (Initial Visit)  ?  Room 15, with daughter  ?NX Kimberly [name] 2020/Paper proficient/[facility] [phone] Neuropathy ?States pain is worse in feet and legs  ? ? ? ? ?ASSESSMENT AND PLAN ? ?Kimberly [name] is a 81 y.o. female   ?Small fiber neuropathy, restless leg syndromes, difficulty sleeping ? Suboptimal response to gabapentin, will change to Lyrica 75 mg 1/1/2, increase to higher dose of Cymbalta 60 mg daily, keep Mirapex 0.25 mg every night, increase trazodone from 50 to 100 mg every night, ? She has small jaw, very narrow oropharyngeal space, is at risk for obstructive sleep apnea, she does not want to proceed with sleep study at this point ? ? ?Bilateral lower extremity pain, ? No significant large fiber sensory loss on examination, her multijoint degenerative changes, including moderate to advanced to right hip, mainly degenerative changes, right second metatarsal stress fracture in 2021, osteoporosis likely playing a major role in her complaints of pain, ? Continue to work with orthopedic surgeon ? ?Mild cognitive impairment ? MoCA examination 22/30 ? MRI of the brain ? Laboratory evaluation to rule out treatable etiology ? ? ?DIAGNOSTIC DATA (LABS, IMAGING, TESTING) ?- I reviewed patient records, labs, notes, testing and imaging myself where available. ? ? ?MEDICAL HISTORY: ?Kimberly [name] is a 81 year old female, seen in refer by her primary care doctor  Jani Gravel a evaluation of peripheral neuropathy, initial evaluation was on July 04 2017. ?  ?I have reviewed and summarized the referring note, she has history of hyperlipidemia, anxiety, restless leg symptoms, lumbar decompression in June 2004, she had low back pain, radiating pain to left side before surgery, surgery really has helped her symptoms, ?  ?Since 2015, she noticed bilateral toes numbness burning tingly sensation, gradually getting worse, difficulty  sleeping at nighttime, urge to move her leg, she was diagnosed with restless leg, over the years, she is on titrating dose of gabapentin, now taking 300/300/600 mg, along with Mirapex 0.25 milligrams every night, trazodone 50 mg every night for sleep, ?  ?She denied gait abnormality, denies significant low back pain, no bowel bladder incontinence. ?  ?UPDATE Jul 28 2017: ?Laboratory evaluation showed normal vitamin D, B12, ESR, RPR, ANA, immunofixation protein pheresis, CMP, A1c was 5.6, ?  ?She continued complaints of bilateral toes cold, burning, pain despite gabapentin 1200 mg daily, Cymbalta 30 mg twice a day, trazodone 50 mg every night, especially after bearing weight, ?  ?Electrodiagnostic study today is normal, there is no evidence of large fiber peripheral neuropathy ?  ?UPDATE Jan 02 2018: ?Skin biopsy showed evidence of small fiber neuropathy, ?Now her neuropathic symptoms is under reasonable control with current dose of gabapentin 300 mg 4 tablets a day, ?one cap in am, one cap midday, two caps at qhs, Cymbalta 60 mg a day, nortriptyline 25 mg 3 tablets every night, she can sleep much better, mild symptomatic during the day.  She also take Mirapex 0.25 mg every night, she does have the urge to move her leg before she goes to bed, ?  ?She could not tolerate Trileptal, because side effect without significantly helped her symptoms at dose of 150 mg twice a day, it was tapered off, ?  ?Laboratory evaluations in November 2018 showed normal negative vitamin D, TSH, CPK, HIV, A1c, ESR, RPR, B12, immunofixative electrophoresis, hemoglobin was 13.3 ?  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  ?Small central disc herniation at L5-S1, without  spinal canal stenosis, diffuse degenerative changes ?  ?She complains of lower extremity pain, mildly unsteady gait.  ?  ?UPDATE March 25 2019: ?She can sleep well now taking gabapentin 300 mg 1 in the morning, 1 at noon, 2 at bedtime, Mirapex 0.25 mg every night, nortriptyline 25 mg 3 tablets every night, trazodone 50 mg every night ?  ?She also complains of bilateral knee pain, left worse than right, has received left knee injection which is helpful, she also complains of left ankle swelling ? ?Update November 24, 2021: ?She was sleeping better with adjustment of her medication in July 2020, has lost follow-up since, today came in complains of worsening sleep difficulty, lower extremity pain ? ?Her pain is much worse on the right side, also have radiating pain from her feet to her right knee, then to her right hip, radiating pain to right groin area, similar but less pain on the left side, increased gait abnormality, she denies significant neck or low back pain, denies bowel or bladder incontinence ? ?She has difficulty sleeping, urged to move her leg, ? ?Her siblings has Parkinson's disease, daughter reported that she has slow worsening memory loss over the past couple years, MoCA examination 22/30 ? ? ?Reviewed evaluation by orthopedic surgeon Dr. Rhona Raider, right hip moderate to advanced degenerative changes, left ankle pain, left shoulder end-stage glenohumeral degenerative changes, right second metatarsal stress fracture 2021, she does have osteoporosis, ? ? ?PHYSICAL EXAM: ?  ?Vitals:  ? 11/24/21 0738  ?BP: (!) 181/94  ?Pulse: 94  ?Weight: 168 lb (76.2 kg)  ?Height: '5\' 3"'  (1.6 m)  ? ?Body mass index is 29.76 kg/m?. ? ?PHYSICAL EXAMNIATION: ? ?Gen: NAD, conversant, well nourised, well groomed                     ?Cardiovascular: Regular rate rhythm, no peripheral edema, warm, nontender. ?Eyes: Conjunctivae clear without exudates or hemorrhage ?Neck: Supple, no carotid bruits. ?Pulmonary: Clear to auscultation  bilaterally  ? ?NEUROLOGICAL EXAM: ? ?MENTAL STATUS: ?Speech: ?   Speech is normal; fluent and spontaneous with normal comprehension.  ?Cognition: ?   ? ?  11/24/2021  ?  8:00 AM  ?Montreal Cognitive Assessment   ?Visuospatial/ Executive (0/5) 2  ?Naming (0/3) 3  ?Attention: Read list of digits (0/2) 2  ?Attention: Read list of letters (0/1) 1  ?Attention: Serial 7 subtraction starting at 100 (0/3) 2  ?Language: Repeat phrase (0/2) 2  ?Language : Fluency (0/1) 0  ?Abstraction (0/2) 2  ?Delayed Recall (0/5) 2  ?Orientation (0/6) 6  ?Total 22  ?  ?  ?CRANIAL NERVES: ?CN II: Visual fields are full to confrontation. Pupils are round equal and briskly reactive to light. ?CN III, IV, VI: extraocular movement are normal. No ptosis. ?CN V: Facial sensation is intact to light touch ?CN VII: Face is symmetric with normal eye closure  ?CN VIII: Hearing is normal to causal conversation. ?CN IX, X: Phonation is normal. ?CN XI: Head turning and shoulder shrug are intact ? ?MOTOR: ?There is no pronator drift of out-stretched arms. Muscle bulk and tone are normal. Muscle strength is normal.  Significant right medial knee pain, limited range of motion of right hip external rotation ? ?REFLEXES: ?Reflexes are 1 and  symmetric at the biceps, triceps, knees, and ankles. Plantar responses are flexor. ? ?SENSORY: ?Intact to light touch, pinprick and vibratory sensation are intact in fingers and toes. ? ?COORDINATION: ?There is no trunk or limb dysmetria noted. ? ?GAIT/STANCE: Need to push-up to get up from seated position, antalgic, due to right knee pain, hip pain, ? ?REVIEW OF SYSTEMS:  ?Full 14 system review of systems performed and notable only for as above ?All other review of systems were negative. ? ? ?ALLERGIES: ?Allergies  ?Allergen Reactions  ? Amoxicillin Swelling  ?  Throat felt swollen ?Did it involve swelling of the face/tongue/throat, SOB, or low BP? Yes ?Did it involve sudden or severe rash/hives, skin peeling, or any  reaction on the inside of your mouth or nose? No ?Did you need to seek medical attention at a hospital or doctor's office? No ?When did it last happen? More than 30 years ago ?If all above answers are ?NO?, may proce

## 2021-11-25 ENCOUNTER — Telehealth: Payer: Self-pay | Admitting: Neurology

## 2021-11-25 ENCOUNTER — Encounter: Payer: Self-pay | Admitting: Neurology

## 2021-11-25 LAB — TSH: TSH: 1.1 u[IU]/mL (ref 0.450–4.500)

## 2021-11-25 LAB — FERRITIN: Ferritin: 64 ng/mL (ref 15–150)

## 2021-11-25 LAB — SEDIMENTATION RATE: Sed Rate: 2 mm/hr (ref 0–40)

## 2021-11-25 LAB — RPR: RPR Ser Ql: NONREACTIVE

## 2021-11-25 LAB — HGB A1C W/O EAG: Hgb A1c MFr Bld: 5.7 % — ABNORMAL HIGH (ref 4.8–5.6)

## 2021-11-25 LAB — C-REACTIVE PROTEIN: CRP: 2 mg/L (ref 0–10)

## 2021-11-25 LAB — VITAMIN B12: Vitamin B-12: 221 pg/mL — ABNORMAL LOW (ref 232–1245)

## 2021-11-25 LAB — CK: Total CK: 45 U/L (ref 26–161)

## 2021-11-25 NOTE — Telephone Encounter (Signed)
Please call patient, laboratory evaluation showed B12 deficiency, level was 221 ? ?She would benefit IM B12 supplement, 1000 mcg daily for 1 week, weekly for 1 month, then monthly ? ?This can be done by her primary care's office or our office ? ?A1c was mildly elevated 5.7, indicating mildly elevated glucose level over the past few months, she would benefit moderate exercise diet control ? ?Rest of the laboratory evaluation showed no significant abnormalities. ?

## 2021-11-25 NOTE — Telephone Encounter (Signed)
I called the pt and we discussed results. She verbalized understanding and sts she will plan to come to the office for her injections.  ?Pt was advised of the plan to take IM B12 supplement, 1000 mcg daily for 1 week, weekly for 1 month, then monthly after. Pt was advised we would discuss further at the time of her first injection. Pt sts she will plan to come next week.  ?

## 2021-11-26 ENCOUNTER — Ambulatory Visit (INDEPENDENT_AMBULATORY_CARE_PROVIDER_SITE_OTHER): Payer: Medicare Other | Admitting: Neurology

## 2021-11-26 DIAGNOSIS — E538 Deficiency of other specified B group vitamins: Secondary | ICD-10-CM | POA: Diagnosis not present

## 2021-11-26 MED ORDER — CYANOCOBALAMIN 1000 MCG/ML IJ SOLN
1000.0000 ug | Freq: Once | INTRAMUSCULAR | Status: AC
  Administered 2021-11-26: 1000 ug via INTRAMUSCULAR

## 2021-11-26 NOTE — Telephone Encounter (Signed)
Patient came in today for a B12 injection. The nurses here did it for her. The patient is confused on how often she is supposed to get them. She thinks every day for a week, which we can't do provided we are closed on the weekends. I offered to set her up with an appointment in a week to come back but she is very confused about what she is supposed to be doing and would like a nurse to call her back and explain. When you call her, please add the future injections to the schedule. Thanks! ?

## 2021-11-26 NOTE — Progress Notes (Signed)
Pt came in today for her B12 injec.  ?Injection given in R deltoid. ?Pt tolerated well.  ? ? ?

## 2021-11-29 ENCOUNTER — Other Ambulatory Visit: Payer: Self-pay | Admitting: *Deleted

## 2021-11-29 MED ORDER — PREGABALIN 75 MG PO CAPS: ORAL_CAPSULE | ORAL | 5 refills | Status: DC

## 2021-11-29 NOTE — Telephone Encounter (Signed)
I spoke to the patient. She is going to see her PCP for the B12 deficiency and mildly elevated A1C. I have printed out the lab results and faxed them to that office. Also, noted she had one injection of B12, 1055mg, on 11/26/21.  ? ?She has a scheduled appt to see KDeon Pilling NP on 11/30/21 at 1:45pm ([facility]. ?

## 2021-11-29 NOTE — Telephone Encounter (Signed)
Patient requested new rx be sent to [facility] (less expensive). Prescription sent to [facility] has been voided. ?

## 2021-11-30 DIAGNOSIS — G629 Polyneuropathy, unspecified: Secondary | ICD-10-CM | POA: Diagnosis not present

## 2021-11-30 DIAGNOSIS — R7303 Prediabetes: Secondary | ICD-10-CM | POA: Diagnosis not present

## 2021-11-30 DIAGNOSIS — E538 Deficiency of other specified B group vitamins: Secondary | ICD-10-CM | POA: Diagnosis not present

## 2021-11-30 DIAGNOSIS — M5416 Radiculopathy, lumbar region: Secondary | ICD-10-CM | POA: Diagnosis not present

## 2021-12-01 DIAGNOSIS — M545 Low back pain, unspecified: Secondary | ICD-10-CM | POA: Diagnosis not present

## 2021-12-02 DIAGNOSIS — M5416 Radiculopathy, lumbar region: Secondary | ICD-10-CM | POA: Diagnosis not present

## 2021-12-04 ENCOUNTER — Other Ambulatory Visit: Payer: Medicare Other

## 2021-12-06 DIAGNOSIS — M5416 Radiculopathy, lumbar region: Secondary | ICD-10-CM | POA: Diagnosis not present

## 2021-12-08 DIAGNOSIS — M5416 Radiculopathy, lumbar region: Secondary | ICD-10-CM | POA: Diagnosis not present

## 2021-12-11 ENCOUNTER — Ambulatory Visit
Admission: RE | Admit: 2021-12-11 | Discharge: 2021-12-11 | Disposition: A | Discharge status: Home / Self Care | Payer: Medicare Other | Source: Ambulatory Visit | Attending: Neurology | Admitting: Neurology

## 2021-12-11 DIAGNOSIS — M79604 Pain in right leg: Secondary | ICD-10-CM | POA: Diagnosis not present

## 2021-12-11 DIAGNOSIS — G629 Polyneuropathy, unspecified: Secondary | ICD-10-CM

## 2021-12-11 DIAGNOSIS — G3184 Mild cognitive impairment, so stated: Secondary | ICD-10-CM | POA: Diagnosis not present

## 2021-12-11 DIAGNOSIS — M79605 Pain in left leg: Secondary | ICD-10-CM | POA: Diagnosis not present

## 2021-12-11 DIAGNOSIS — M629 Disorder of muscle, unspecified: Secondary | ICD-10-CM

## 2021-12-11 DIAGNOSIS — G2581 Restless legs syndrome: Secondary | ICD-10-CM

## 2021-12-11 DIAGNOSIS — M25572 Pain in left ankle and joints of left foot: Secondary | ICD-10-CM

## 2021-12-13 ENCOUNTER — Telehealth: Payer: Self-pay | Admitting: Neurology

## 2021-12-13 DIAGNOSIS — M5416 Radiculopathy, lumbar region: Secondary | ICD-10-CM | POA: Diagnosis not present

## 2021-12-13 NOTE — Telephone Encounter (Signed)
Patient returned my call. I advised her that Dr. Krista Blue reviewed her MRI brain results and found mild age-related changes with small vessel disease, not acute abnormality. She reports that she will continue her baby aspirin daily. She will call us with questions or concerns prior to her follow up appointment. Pt verbalized understanding of results. ?

## 2021-12-13 NOTE — Telephone Encounter (Signed)
I called patient to discuss. No answer, left a message asking her to call us back. 

## 2021-12-13 NOTE — Telephone Encounter (Signed)
Please call patient, MRI of the brain showed mild age-related changes, evidence of supratentorium small vessel disease, there is no acute abnormality ? ?Please continue take medication as discussed during office visit,  she should also take aspirin 81 mg daily ? ? ?IMPRESSION:   ?1.   Mild to moderate generalized cortical atrophy. ?2.   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  Scattered T2/FLAIR hyperintense foci in the hemispheres consistent with mild chronic microvascular ischemic change. ?3.   Left cerebellar developmental venous anomaly ?4.   No acute findings. ?  ?

## 2021-12-16 ENCOUNTER — Other Ambulatory Visit: Payer: Self-pay | Admitting: Registered Nurse

## 2021-12-16 DIAGNOSIS — Z1231 Encounter for screening mammogram for malignant neoplasm of breast: Secondary | ICD-10-CM

## 2021-12-18 ENCOUNTER — Other Ambulatory Visit: Payer: Self-pay | Admitting: Neurology

## 2021-12-20 NOTE — Telephone Encounter (Signed)
Medication filled recently. ?

## 2021-12-21 DIAGNOSIS — M5416 Radiculopathy, lumbar region: Secondary | ICD-10-CM | POA: Diagnosis not present

## 2021-12-27 DIAGNOSIS — M5416 Radiculopathy, lumbar region: Secondary | ICD-10-CM | POA: Diagnosis not present

## 2021-12-29 DIAGNOSIS — M5416 Radiculopathy, lumbar region: Secondary | ICD-10-CM | POA: Diagnosis not present

## 2022-01-03 DIAGNOSIS — M19072 Primary osteoarthritis, left ankle and foot: Secondary | ICD-10-CM | POA: Diagnosis not present

## 2022-01-03 DIAGNOSIS — M19012 Primary osteoarthritis, left shoulder: Secondary | ICD-10-CM | POA: Diagnosis not present

## 2022-01-03 DIAGNOSIS — M25571 Pain in right ankle and joints of right foot: Secondary | ICD-10-CM | POA: Diagnosis not present

## 2022-01-03 DIAGNOSIS — M25572 Pain in left ankle and joints of left foot: Secondary | ICD-10-CM | POA: Diagnosis not present

## 2022-01-04 DIAGNOSIS — M5416 Radiculopathy, lumbar region: Secondary | ICD-10-CM | POA: Diagnosis not present

## 2022-01-06 DIAGNOSIS — M5416 Radiculopathy, lumbar region: Secondary | ICD-10-CM | POA: Diagnosis not present

## 2022-01-11 DIAGNOSIS — M5416 Radiculopathy, lumbar region: Secondary | ICD-10-CM | POA: Diagnosis not present

## 2022-01-12 ENCOUNTER — Encounter: Payer: Self-pay | Admitting: Neurology

## 2022-01-13 ENCOUNTER — Encounter: Payer: Self-pay | Admitting: Neurology

## 2022-01-17 ENCOUNTER — Ambulatory Visit
Admission: RE | Admit: 2022-01-17 | Discharge: 2022-01-17 | Disposition: A | Discharge status: Home / Self Care | Payer: Medicare Other | Source: Ambulatory Visit | Attending: Registered Nurse | Admitting: Registered Nurse

## 2022-01-17 DIAGNOSIS — Z1231 Encounter for screening mammogram for malignant neoplasm of breast: Secondary | ICD-10-CM

## 2022-01-17 NOTE — Telephone Encounter (Signed)
Phone note from 12/13/21: Please call patient, MRI of the brain showed mild age-related changes, evidence of supratentorium small vessel disease, there is no acute abnormality  Please continue take medication as discussed during office visit,  she should also take aspirin 81 mg daily ____________________________________ The patient spoke to [place] that day. I called her again this morning. She wanted to review the results again. Provided the information above. She did not have any further questions.

## 2022-01-25 ENCOUNTER — Encounter: Payer: Self-pay | Admitting: Neurology

## 2022-01-25 ENCOUNTER — Telehealth (INDEPENDENT_AMBULATORY_CARE_PROVIDER_SITE_OTHER): Payer: Medicare Other | Admitting: Neurology

## 2022-01-25 DIAGNOSIS — E538 Deficiency of other specified B group vitamins: Secondary | ICD-10-CM | POA: Diagnosis not present

## 2022-01-25 DIAGNOSIS — M25552 Pain in left hip: Secondary | ICD-10-CM

## 2022-01-25 DIAGNOSIS — M79604 Pain in right leg: Secondary | ICD-10-CM | POA: Diagnosis not present

## 2022-01-25 DIAGNOSIS — M79605 Pain in left leg: Secondary | ICD-10-CM | POA: Diagnosis not present

## 2022-01-25 DIAGNOSIS — G3184 Mild cognitive impairment, so stated: Secondary | ICD-10-CM | POA: Diagnosis not present

## 2022-01-25 DIAGNOSIS — G2581 Restless legs syndrome: Secondary | ICD-10-CM | POA: Diagnosis not present

## 2022-01-25 MED ORDER — CELECOXIB 50 MG PO CAPS: 50.0000 mg | ORAL_CAPSULE | Freq: Two times a day (BID) | ORAL | 6 refills | Status: DC

## 2022-01-25 MED ORDER — PREGABALIN 150 MG PO CAPS: ORAL_CAPSULE | ORAL | 5 refills | Status: DC

## 2022-01-25 NOTE — Progress Notes (Signed)
No chief complaint on file.     ASSESSMENT AND PLAN  Kimberly [name] is a 81 y.o. female   Small fiber neuropathy, restless leg syndromes, difficulty sleeping, multiple joints pain, worsening left hip pain,  Suboptimal response to gabapentin, mild improvement with Lyrica 75 mg 4 tablets a day, tolerating it well, increase to 150 mg 4 times a day, Keep Cymbalta 60 mg daily, Mirapex 0.25 mg every night, increase trazodone 100 mg  every night,  She has small jaw, very narrow oropharyngeal space, is at risk for obstructive sleep apnea, she does not want to proceed with sleep study at this point X-ray of bilateral hip, Stop Mobic, Celebrex 50 mg twice daily as needed may alternate with Tylenol as needed  Mild cognitive impairment  MoCA examination 22/30  MRI of the brain in April 2023 reviewed with patient and her daughter daughter, mild generalized atrophy, small vessel disease, no acute abnormalities.  Laboratory evaluation showed no treatable etiology   DIAGNOSTIC DATA (LABS, IMAGING, TESTING) - I reviewed patient records, labs, notes, testing and imaging myself where available.   MEDICAL HISTORY: Kimberly [name] is a 81 year old female, seen in refer by her primary care doctor  Jani Gravel a evaluation of peripheral neuropathy, initial evaluation was on July 04 2017.   I have reviewed and summarized the referring note, she has history of hyperlipidemia, anxiety, restless leg symptoms, lumbar decompression in June 2004, she had low back pain, radiating pain to left side before surgery, surgery really has helped her symptoms,   Since 2015, she noticed bilateral toes numbness burning tingly sensation, gradually getting worse, difficulty sleeping at nighttime, urge to move her leg, she was diagnosed with restless leg, over the years, she is on titrating dose of gabapentin, now taking 300/300/600 mg, along with Mirapex 0.25 milligrams every night, trazodone 50 mg every night for  sleep,   She denied gait abnormality, denies significant low back pain, no bowel bladder incontinence.   UPDATE Jul 28 2017: Laboratory evaluation showed normal vitamin D, B12, ESR, RPR, ANA, immunofixation protein pheresis, CMP, A1c was 5.6,   She continued complaints of bilateral toes cold, burning, pain despite gabapentin 1200 mg daily, Cymbalta 30 mg twice a day, trazodone 50 mg every night, especially after bearing weight,   Electrodiagnostic study today is normal, there is no evidence of large fiber peripheral neuropathy   UPDATE Jan 02 2018: Skin biopsy showed evidence of small fiber neuropathy, Now her neuropathic symptoms is under reasonable control with current dose of gabapentin 300 mg 4 tablets a day, one cap in am, one cap midday, two caps at qhs, Cymbalta 60 mg a day, nortriptyline 25 mg 3 tablets every night, she can sleep much better, mild symptomatic during the day.  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    UPDATE March 25 2019: She can sleep well now taking gabapentin 300 mg 1 in the morning, 1 at noon, 2 at bedtime, Mirapex 0.25  mg every night, nortriptyline 25 mg 3 tablets every night, trazodone 50 mg every night   She also complains of bilateral knee pain, left worse than right, has received left knee injection which is helpful, she also complains of left ankle swelling  Update November 24, 2021: She was sleeping better with adjustment of her medication in July 2020, has lost follow-up since, today came in complains of worsening sleep difficulty, lower extremity pain  Her pain is much worse on the right side, also have radiating pain from her feet to her right knee, then to her right hip, radiating pain to right groin area, similar but less pain on the left side, increased gait abnormality, she denies significant neck or low back pain, denies bowel or bladder incontinence  She has difficulty sleeping, urged to move her leg,  Her siblings has Parkinson's disease, daughter reported that she has slow worsening memory loss over the past couple years, MoCA examination 22/30   Reviewed evaluation by orthopedic surgeon Dr. Rhona Raider, right hip moderate to advanced degenerative changes, left ankle pain, left shoulder end-stage glenohumeral degenerative changes, right second metatarsal stress fracture 2021, she does have osteoporosis,   Virtual Visit via video UPDATE Jan 25, 2022 I discussed the limitations of evaluation and management by telemedicine and the availability of in person appointments. The patient expressed understanding and agreed to proceed  Location: Provider: [place] office; Patient: Home  I connected with [name]  on Jan 25, 2022 by a video enabled telemedicine application and verified that I am speaking with the correct person using two identifiers.  UPDATED HISTORY She is with her daughter, complains of diffuse body achy pain, only mild improvement with Lyrica 75 mg 4 tablets a day, during the day especially after weightbearing, she complains of bilateral feet, lower extremity deep aching numbing  pain, worsening left hip pain  At nighttime, it is difficult for her to find a comfortable position to go to sleep, urged to move her leg, despite taking trazodone 100 mg at night, Lyrica 75 mg 2 tablets at night, Mirapex 0.25 mg   Observations/Objective: I have reviewed problem lists, medications, allergies.  Awake, alert, oriented to history taking and care conversation, facial symmetric, no dysarthria, no aphasia, moving 4 extremities without difficulties, ambulate without assistant, fairly steady   REVIEW OF SYSTEMS:  Full 14 system review of systems performed and notable only for as above All other review of systems were negative.   ALLERGIES: Allergies  Allergen Reactions   Amoxicillin Swelling    Throat felt swollen Did it involve swelling of the face/tongue/throat, SOB, or low BP? Yes Did it involve sudden or severe rash/hives, skin peeling, or any reaction on the inside of your mouth or nose? No Did you need to seek medical attention at a hospital or doctor's office? No When did it last happen? More than 30 years ago If all above answers are "NO", may proceed with cephalosporin use.    Ciprofloxacin Itching   Adhesive [Tape] Other (See Comments)    Redness/itching.   Benzoyl Peroxide Itching   Hydrocodone-Acetaminophen Itching   Metronidazole Hives    Other reaction(s): hives   Oxycodone Other (See Comments)    disoriented  Sulfa Antibiotics Rash    And itching    HOME MEDICATIONS: Current Outpatient Medications  Medication Sig Dispense Refill   acetaminophen (TYLENOL) 500 MG tablet Take 2 tablets (1,000 mg total) by mouth every 8 (eight) hours. (Patient taking differently: Take 1,000 mg by mouth 3 (three) times daily.) 30 tablet 0   amLODipine (NORVASC) 2.5 MG tablet Take 2.5 mg by mouth daily.     DULoxetine (CYMBALTA) 60 MG capsule Take 1 capsule (60 mg total) by mouth daily. 90 capsule 3   Magnesium 500 MG CAPS Take 500 mg by mouth daily.     meloxicam  (MOBIC) 7.5 MG tablet Take 7.5 mg by mouth daily as needed.     omeprazole (PRILOSEC) 40 MG capsule omeprazole 40 mg capsule,delayed release     pramipexole (MIRAPEX) 0.25 MG tablet Take 0.25 mg by mouth at bedtime.     pregabalin (LYRICA) 75 MG capsule 1 cap am/1 at noon/ 2 at night 120 capsule 5   simvastatin (ZOCOR) 40 MG tablet Take 40 mg by mouth at bedtime.      traMADol (ULTRAM) 50 MG tablet Take 50 mg by mouth daily as needed for pain.     traZODone (DESYREL) 50 MG tablet Take 2 tablets (100 mg total) by mouth at bedtime. 60 tablet 11   No current facility-administered medications for this visit.    PAST MEDICAL HISTORY: Past Medical History:  Diagnosis Date   Complication of anesthesia    Pt reports that she was told "someone had to breath for her" She is not clear what this means   Difficult intubation    Diverticulitis    History of COVID-19 05/2020   Hypercholesteremia    Peripheral neuropathy    Restless leg syndrome     PAST SURGICAL HISTORY: Past Surgical History:  Procedure Laterality Date   ABDOMINAL HYSTERECTOMY     Age 57   BACK SURGERY     BREAST BIOPSY Left    COLONOSCOPY WITH PROPOFOL N/A 11/13/2020   Procedure: COLONOSCOPY WITH PROPOFOL;  Surgeon: Carol Ada, MD;  Location: [facility];  Service: Endoscopy;  Laterality: N/A;   ESOPHAGOGASTRODUODENOSCOPY (EGD) WITH PROPOFOL N/A 11/13/2020   Procedure: ESOPHAGOGASTRODUODENOSCOPY (EGD) WITH PROPOFOL;  Surgeon: Carol Ada, MD;  Location: [facility];  Service: Endoscopy;  Laterality: N/A;   EYE SURGERY     bilateral cateract    POLYPECTOMY  11/13/2020   Procedure: POLYPECTOMY;  Surgeon: Carol Ada, MD;  Location: [facility];  Service: Endoscopy;;   TONSILLECTOMY     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   Procedure: TOTAL KNEE ARTHROPLASTY;  Surgeon: Paralee Cancel, MD;  Location: [facility];  Service: Orthopedics;  Laterality: Left;  70 mins    FAMILY HISTORY: Family History  Problem  Relation Age of Onset   Breast cancer Sister    Other Mother        passed away at age 67   Heart attack Father        passed away at age 36    SOCIAL HISTORY: Social History   Socioeconomic History   Marital status: Widowed    Spouse name: Not on file   Number of children: 1   Years of education: 12   Highest education level: High school graduate  Occupational History   Occupation: Retired  Tobacco Use   Smoking status: Never   Smokeless tobacco: Never  Vaping Use   Vaping Use: Never used  Substance and Sexual Activity   Alcohol use: No  Drug use: No   Sexual activity: Not on file  Other Topics Concern   Not on file  Social History Narrative   Lives at home alone.   Right-handed.   1 cup caffeine daily.   Social Determinants of Health   Financial Resource Strain: Not on file  Food Insecurity: Not on file  Transportation Needs: Not on file  Physical Activity: Not on file  Stress: Not on file  Social Connections: Not on file  Intimate Partner Violence: Not on file   Total time spent reviewing the chart, obtaining history, examined patient, ordering tests, documentation, consultations and family, care coordination was 74     Marcial Pacas, M.D. Ph.D.  [facility] [address], [place], [place] [address] Ph: [phone] Fax: [phone]  CC:  [address], [place] [address] [place],  [place] [address]  [place] [place], [place]

## 2022-01-25 NOTE — Patient Instructions (Addendum)
[facility] Image    Address: [address], [place], [place] [address]  Phone: [phone]  Meds ordered this encounter   celecoxib (CELEBREX) 50 MG capsule    Sig: Take 1 capsule (50 mg total) by mouth 2 (two) times daily as needed after meal    Dispense:  60 capsule    Refill:  6----[facility]   pregabalin (LYRICA) 150 MG capsule---[facility]    Sig: 1 cap am/1 at noon/ 2 at night    Dispense:  120 capsule    Refill:  5       Limit the daily tylenol to less than '3000mg'$  daily.  Celebrex '50mg'$   twice a day as needed.  Lyrica '150mg'$  max 4 cap a day.

## 2022-02-02 ENCOUNTER — Other Ambulatory Visit: Payer: Self-pay | Admitting: Neurology

## 2022-02-02 ENCOUNTER — Ambulatory Visit
Admission: RE | Admit: 2022-02-02 | Discharge: 2022-02-02 | Disposition: A | Discharge status: Home / Self Care | Payer: Medicare Other | Source: Ambulatory Visit | Attending: Neurology | Admitting: Neurology

## 2022-02-02 DIAGNOSIS — M25552 Pain in left hip: Secondary | ICD-10-CM

## 2022-02-02 DIAGNOSIS — G2581 Restless legs syndrome: Secondary | ICD-10-CM

## 2022-02-02 DIAGNOSIS — M79604 Pain in right leg: Secondary | ICD-10-CM

## 2022-02-02 DIAGNOSIS — G3184 Mild cognitive impairment, so stated: Secondary | ICD-10-CM

## 2022-02-02 DIAGNOSIS — E538 Deficiency of other specified B group vitamins: Secondary | ICD-10-CM

## 2022-02-14 DIAGNOSIS — M25571 Pain in right ankle and joints of right foot: Secondary | ICD-10-CM | POA: Diagnosis not present

## 2022-02-14 DIAGNOSIS — M19072 Primary osteoarthritis, left ankle and foot: Secondary | ICD-10-CM | POA: Diagnosis not present

## 2022-02-14 DIAGNOSIS — M25512 Pain in left shoulder: Secondary | ICD-10-CM | POA: Diagnosis not present

## 2022-02-17 ENCOUNTER — Other Ambulatory Visit: Payer: Self-pay | Admitting: Neurology

## 2022-02-17 NOTE — Telephone Encounter (Signed)
Rx refilled for 90 day supply. 

## 2022-02-23 DIAGNOSIS — M19012 Primary osteoarthritis, left shoulder: Secondary | ICD-10-CM | POA: Diagnosis not present

## 2022-03-04 ENCOUNTER — Other Ambulatory Visit: Payer: Self-pay | Admitting: Orthopedic Surgery

## 2022-03-07 DIAGNOSIS — M810 Age-related osteoporosis without current pathological fracture: Secondary | ICD-10-CM | POA: Diagnosis not present

## 2022-03-11 DIAGNOSIS — R7303 Prediabetes: Secondary | ICD-10-CM | POA: Diagnosis not present

## 2022-03-11 DIAGNOSIS — E538 Deficiency of other specified B group vitamins: Secondary | ICD-10-CM | POA: Diagnosis not present

## 2022-03-11 DIAGNOSIS — Z01818 Encounter for other preprocedural examination: Secondary | ICD-10-CM | POA: Diagnosis not present

## 2022-03-11 DIAGNOSIS — T884XXD Failed or difficult intubation, subsequent encounter: Secondary | ICD-10-CM | POA: Diagnosis not present

## 2022-03-23 NOTE — Care Plan (Signed)
Ortho Bundle Case Management Note  Patient Details  Name: Kimberly [name] MRN: [id number] Date of Birth: 07-18-1941    Spoke with patient prior to surgery. She will discharge to home with family to assist. No DME or Therapy needs at this time. OPPT will be arranged at 4 week post op appointment. Patient and MD in agreement                  DME Arranged:    DME Agency:     HH Arranged:    [place] Agency:     Additional Comments: Please contact me with any questions of if this plan should need to change.  Ladell Heads,  [place] [facility]  [phone] 03/23/2022, 3:06 PM

## 2022-03-25 NOTE — Progress Notes (Addendum)
No benzoyl peroxide given, patient is allergic  COVID Vaccine Completed: no  Date of COVID positive in last 90 days: no  PCP - Fatima Sanger, FNP Cardiologist - n/a  Chest x-ray - 03/29/22 Epic EKG - req from PCP Stress Test - n/a ECHO - n/a Cardiac Cath - n/a Pacemaker/ICD device last checked: n/a Spinal Cord Stimulator: n/a  Bowel Prep - no  Sleep Study - n/a CPAP -   Fasting Blood Sugar - pre DM, pt denies hx Checks Blood Sugar _____ times a day  Blood Thinner Instructions: Aspirin Instructions: ASA 81, no instructions per pt. Instructed to call provider Last Dose:  Activity level: Can go up a flight of stairs and perform activities of daily living without stopping and without symptoms of chest pain or shortness of breath.      Anesthesia review: difficult intubation, preDM, mild cognitive impairment, HTN  Patient denies shortness of breath, fever, cough and chest pain at PAT appointment  Patient verbalized understanding of instructions that were given to them at the PAT appointment. Patient was also instructed that they will need to review over the PAT instructions again at home before surgery.

## 2022-03-25 NOTE — Patient Instructions (Signed)
SURGICAL WAITING ROOM VISITATION Patients having surgery or a procedure may have no more than 2 support people in the waiting area - these visitors may rotate.   Children under the age of 22 must have an adult with them who is not the patient. If the patient needs to stay at the hospital during part of their recovery, the visitor guidelines for inpatient rooms apply. Pre-op nurse will coordinate an appropriate time for 1 support person to accompany patient in pre-op.  This support person may not rotate.    Please refer to the [facility] website for the visitor guidelines for Inpatients (after your surgery is over and you are in a regular room).    Your procedure is scheduled on: 04/07/22   Report to [facility] Main Entrance    Report to admitting at 5:15 AM   Call this number if you have problems the morning of surgery [phone]   Do not eat food :After Midnight.   After Midnight you may have the following liquids until 4:30 AM DAY OF SURGERY  Water Non-Citrus Juices (without pulp, NO RED) Carbonated Beverages Black Coffee (NO MILK/CREAM OR CREAMERS, sugar ok)  Clear Tea (NO MILK/CREAM OR CREAMERS, sugar ok) regular and decaf                             Plain Jell-O (NO RED)                                           Fruit ices (not with fruit pulp, NO RED)                                     Popsicles (NO RED)                                                               Sports drinks like Gatorade (NO RED)                  The day of surgery:  Drink ONE (1) Pre-Surgery G2 at 4:30 AM the morning of surgery. Drink in one sitting. Do not sip.  This drink was given to you during your hospital  pre-op appointment visit. Nothing else to drink after completing the  Pre-Surgery G2.          If you have questions, please contact your surgeon's office.   FOLLOW BOWEL PREP AND ANY ADDITIONAL PRE OP INSTRUCTIONS YOU RECEIVED FROM YOUR SURGEON'S OFFICE!!!     Oral Hygiene  is also important to reduce your risk of infection.                                    Remember - BRUSH YOUR TEETH THE MORNING OF SURGERY WITH YOUR REGULAR TOOTHPASTE   Take these medicines the morning of surgery with A SIP OF WATER: Tylenol, Amlodipine, Duloxetine, Omeprazole, Lyrica   Call your doctor and ask for instructions regarding holding Aspirin.  You may not have any metal on your body including hair pins, jewelry, and body piercing             Do not wear make-up, lotions, powders, perfumes, or deodorant  Do not wear nail polish including gel and S&S, artificial/acrylic nails, or any other type of covering on natural nails including finger and toenails. If you have artificial nails, gel coating, etc. that needs to be removed by a nail salon please have this removed prior to surgery or surgery may need to be canceled/ delayed if the surgeon/ anesthesia feels like they are unable to be safely monitored.   Do not shave  48 hours prior to surgery.    Do not bring valuables to the hospital. [place].   Contacts, dentures or bridgework may not be worn into surgery.  DO NOT [place]. PHARMACY WILL DISPENSE MEDICATIONS LISTED ON YOUR MEDICATION LIST TO YOU DURING YOUR ADMISSION [place]!    Patients discharged on the day of surgery will not be allowed to drive home.  Someone NEEDS to stay with you for the first 24 hours after anesthesia.   Special Instructions: Bring a copy of your healthcare power of attorney and living will documents         the day of surgery if you haven't scanned them before.              Please read over the following fact sheets you were given: IF YOU HAVE QUESTIONS ABOUT YOUR PRE-OP INSTRUCTIONS PLEASE CALL [facility] - Preparing for Surgery Before surgery, you can play an important role.  Because skin is not  sterile, your skin needs to be as free of germs as possible.  You can reduce the number of germs on your skin by washing with CHG (chlorahexidine gluconate) soap before surgery.  CHG is an antiseptic cleaner which kills germs and bonds with the skin to continue killing germs even after washing. Please DO NOT use if you have an allergy to CHG or antibacterial soaps.  If your skin becomes reddened/irritated stop using the CHG and inform your nurse when you arrive at Short Stay. Do not shave (including legs and underarms) for at least 48 hours prior to the first CHG shower.  You may shave your face/neck.  Please follow these instructions carefully:  1.  Shower with CHG Soap the night before surgery and the  morning of surgery.  2.  If you choose to wash your hair, wash your hair first as usual with your normal  shampoo.  3.  After you shampoo, rinse your hair and body thoroughly to remove the shampoo.                             4.  Use CHG as you would any other liquid soap.  You can apply chg directly to the skin and wash.  Gently with a scrungie or clean washcloth.  5.  Apply the CHG Soap to your body ONLY FROM THE NECK DOWN.   Do   not use on face/ open                           Wound or open sores. Avoid contact with  eyes, ears mouth and   genitals (private parts).                       Wash face,  Genitals (private parts) with your normal soap.             6.  Wash thoroughly, paying special attention to the area where your    surgery  will be performed.  7.  Thoroughly rinse your body with warm water from the neck down.  8.  DO NOT shower/wash with your normal soap after using and rinsing off the CHG Soap.                9.  Pat yourself dry with a clean towel.            10.  Wear clean pajamas.            11.  Place clean sheets on your bed the night of your first shower and do not  sleep with pets. Day of Surgery : Do not apply any lotions/deodorants the morning of surgery.  Please wear  clean clothes to the hospital/surgery center.  FAILURE TO FOLLOW THESE INSTRUCTIONS MAY RESULT IN THE CANCELLATION OF YOUR SURGERY  PATIENT SIGNATURE_________________________________  NURSE SIGNATURE__________________________________  ________________________________________________________________________   Kimberly [name]  An incentive spirometer is a tool that can help keep your lungs clear and active. This tool measures how well you are filling your lungs with each breath. Taking long deep breaths may help reverse or decrease the chance of developing breathing (pulmonary) problems (especially infection) following: A long period of time when you are unable to move or be active. BEFORE THE PROCEDURE  If the spirometer includes an indicator to show your best effort, your nurse or respiratory therapist will set it to a desired goal. If possible, sit up straight or lean slightly forward. Try not to slouch. Hold the incentive spirometer in an upright position. INSTRUCTIONS FOR USE  Sit on the edge of your bed if possible, or sit up as far as you can in bed or on a chair. Hold the incentive spirometer in an upright position. Breathe out normally. Place the mouthpiece in your mouth and seal your lips tightly around it. Breathe in slowly and as deeply as possible, raising the piston or the ball toward the top of the column. Hold your breath for 3-5 seconds or for as long as possible. Allow the piston or ball to fall to the bottom of the column. Remove the mouthpiece from your mouth and breathe out normally. Rest for a few seconds and repeat Steps 1 through 7 at least 10 times every 1-2 hours when you are awake. Take your time and take a few normal breaths between deep breaths. The spirometer may include an indicator to show your best effort. Use the indicator as a goal to work toward during each repetition. After each set of 10 deep breaths, practice coughing to be sure your lungs are  clear. If you have an incision (the cut made at the time of surgery), support your incision when coughing by placing a pillow or rolled up towels firmly against it. Once you are able to get out of bed, walk around indoors and cough well. You may stop using the incentive spirometer when instructed by your caregiver.  RISKS AND COMPLICATIONS Take your time so you do not get dizzy or light-headed. If you are in pain, you may need to take or ask for  pain medication before doing incentive spirometry. It is harder to take a deep breath if you are having pain. AFTER USE Rest and breathe slowly and easily. It can be helpful to keep track of a log of your progress. Your caregiver can provide you with a simple table to help with this. If you are using the spirometer at home, follow these instructions: [place] IF:  You are having difficultly using the spirometer. You have trouble using the spirometer as often as instructed. Your pain medication is not giving enough relief while using the spirometer. You develop fever of 100.5 F (38.1 C) or higher. SEEK IMMEDIATE MEDICAL CARE IF:  You cough up bloody sputum that had not been present before. You develop fever of 102 F (38.9 C) or greater. You develop worsening pain at or near the incision site. MAKE SURE YOU:  Understand these instructions. Will watch your condition. Will get help right away if you are not doing well or get worse. Document Released: 12/26/2006 Document Revised: 11/07/2011 Document Reviewed: 02/26/2007 ExitCare Patient Information 2014 Exit[employer], [place].   ________________________________________________________________________  WHAT IS A BLOOD TRANSFUSION? Blood Transfusion Information  A transfusion is the replacement of blood or some of its parts. Blood is made up of multiple cells which provide different functions. Red blood cells carry oxygen and are used for blood loss replacement. White blood cells fight against  infection. Platelets control bleeding. Plasma helps clot blood. Other blood products are available for specialized needs, such as hemophilia or other clotting disorders. BEFORE THE TRANSFUSION  Who gives blood for transfusions?  Healthy volunteers who are fully evaluated to make sure their blood is safe. This is blood bank blood. Transfusion therapy is the safest it has ever been in the practice of medicine. Before blood is taken from a donor, a complete history is taken to make sure that person has no history of diseases nor engages in risky social behavior (examples are intravenous drug use or sexual activity with multiple partners). The donor's travel history is screened to minimize risk of transmitting infections, such as malaria. The donated blood is tested for signs of infectious diseases, such as HIV and hepatitis. The blood is then tested to be sure it is compatible with you in order to minimize the chance of a transfusion reaction. If you or a relative donates blood, this is often done in anticipation of surgery and is not appropriate for emergency situations. It takes many days to process the donated blood. RISKS AND COMPLICATIONS Although transfusion therapy is very safe and saves many lives, the main dangers of transfusion include:  Getting an infectious disease. Developing a transfusion reaction. This is an allergic reaction to something in the blood you were given. Every precaution is taken to prevent this. The decision to have a blood transfusion has been considered carefully by your caregiver before blood is given. Blood is not given unless the benefits outweigh the risks. AFTER THE TRANSFUSION Right after receiving a blood transfusion, you will usually feel much better and more energetic. This is especially true if your red blood cells have gotten low (anemic). The transfusion raises the level of the red blood cells which carry oxygen, and this usually causes an energy increase. The  nurse administering the transfusion will monitor you carefully for complications. HOME CARE INSTRUCTIONS  No special instructions are needed after a transfusion. You may find your energy is better. Speak with your caregiver about any limitations on activity for underlying diseases you may have.  SEEK MEDICAL CARE IF:  Your condition is not improving after your transfusion. You develop redness or irritation at the intravenous (IV) site. SEEK IMMEDIATE MEDICAL CARE IF:  Any of the following symptoms occur over the next 12 hours: Shaking chills. You have a temperature by mouth above 102 F (38.9 C), not controlled by medicine. Chest, back, or muscle pain. People around you feel you are not acting correctly or are confused. Shortness of breath or difficulty breathing. Dizziness and fainting. You get a rash or develop hives. You have a decrease in urine output. Your urine turns a dark color or changes to pink, red, or brown. Any of the following symptoms occur over the next 10 days: You have a temperature by mouth above 102 F (38.9 C), not controlled by medicine. Shortness of breath. Weakness after normal activity. The white part of the eye turns yellow (jaundice). You have a decrease in the amount of urine or are urinating less often. Your urine turns a dark color or changes to pink, red, or brown. Document Released: 08/12/2000 Document Revised: 11/07/2011 Document Reviewed: 03/31/2008 [facility] Patient Information 2014 [place], [place].  _______________________________________________________________________

## 2022-03-29 ENCOUNTER — Ambulatory Visit (HOSPITAL_COMMUNITY)
Admission: RE | Admit: 2022-03-29 | Discharge: 2022-03-29 | Disposition: A | Discharge status: Home / Self Care | Payer: Medicare Other | Source: Ambulatory Visit | Attending: Orthopedic Surgery | Admitting: Orthopedic Surgery

## 2022-03-29 ENCOUNTER — Encounter (HOSPITAL_COMMUNITY)
Admission: RE | Admit: 2022-03-29 | Discharge: 2022-03-29 | Disposition: A | Discharge status: Home / Self Care | Payer: Medicare Other | Source: Ambulatory Visit | Attending: Orthopedic Surgery | Admitting: Orthopedic Surgery

## 2022-03-29 ENCOUNTER — Encounter (HOSPITAL_COMMUNITY): Payer: Self-pay

## 2022-03-29 VITALS — BP 148/78 | HR 81 | Temp 97.9°F | Resp 14 | Ht 63.0 in | Wt 169.0 lb

## 2022-03-29 DIAGNOSIS — R7303 Prediabetes: Secondary | ICD-10-CM | POA: Diagnosis not present

## 2022-03-29 DIAGNOSIS — I2510 Atherosclerotic heart disease of native coronary artery without angina pectoris: Secondary | ICD-10-CM | POA: Diagnosis not present

## 2022-03-29 DIAGNOSIS — Z01818 Encounter for other preprocedural examination: Secondary | ICD-10-CM | POA: Insufficient documentation

## 2022-03-29 HISTORY — DX: Unspecified osteoarthritis, unspecified site: M19.90

## 2022-03-29 HISTORY — DX: Essential (primary) hypertension: I10

## 2022-03-29 HISTORY — DX: Prediabetes: R73.03

## 2022-03-29 HISTORY — DX: Mild cognitive impairment of uncertain or unknown etiology: G31.84

## 2022-03-29 HISTORY — DX: Gastro-esophageal reflux disease without esophagitis: K21.9

## 2022-03-29 LAB — BASIC METABOLIC PANEL
Anion gap: 5 (ref 5–15)
BUN: 16 mg/dL (ref 8–23)
CO2: 29 mmol/L (ref 22–32)
Calcium: 9.8 mg/dL (ref 8.9–10.3)
Chloride: 109 mmol/L (ref 98–111)
Creatinine, Ser: 0.54 mg/dL (ref 0.44–1.00)
GFR, Estimated: >60 mL/min (ref >60)
Glucose, Bld: 100 mg/dL — ABNORMAL HIGH (ref 70–99)
Potassium: 4.7 mmol/L (ref 3.5–5.1)
Sodium: 143 mmol/L (ref 135–145)

## 2022-03-29 LAB — CBC
HCT: 39.8 % (ref 36.0–46.0)
Hemoglobin: 12.5 g/dL (ref 12.0–15.0)
MCH: 29.6 pg (ref 26.0–34.0)
MCHC: 31.4 g/dL (ref 30.0–36.0)
MCV: 94.3 fL (ref 80.0–100.0)
Platelets: 241 10*3/uL (ref 150–400)
RBC: 4.22 MIL/uL (ref 3.87–5.11)
RDW: 13.7 % (ref 11.5–15.5)
WBC: 6.9 10*3/uL (ref 4.0–10.5)
nRBC: 0 % (ref 0.0–0.2)

## 2022-03-29 LAB — HEMOGLOBIN A1C
Hgb A1c MFr Bld: 5.4 % (ref 4.8–5.6)
Mean Plasma Glucose: 108.28 mg/dL

## 2022-03-29 LAB — SURGICAL PCR SCREEN
MRSA, PCR: NEGATIVE
Staphylococcus aureus: NEGATIVE

## 2022-03-29 LAB — GLUCOSE, CAPILLARY: Glucose-Capillary: 117 mg/dL — ABNORMAL HIGH (ref 70–99)

## 2022-03-31 ENCOUNTER — Ambulatory Visit (INDEPENDENT_AMBULATORY_CARE_PROVIDER_SITE_OTHER): Payer: Medicare Other | Admitting: Podiatry

## 2022-03-31 ENCOUNTER — Ambulatory Visit: Payer: Medicare Other

## 2022-03-31 DIAGNOSIS — M79675 Pain in left toe(s): Secondary | ICD-10-CM | POA: Diagnosis not present

## 2022-03-31 DIAGNOSIS — I2510 Atherosclerotic heart disease of native coronary artery without angina pectoris: Secondary | ICD-10-CM | POA: Diagnosis not present

## 2022-03-31 DIAGNOSIS — M7752 Other enthesopathy of left foot: Secondary | ICD-10-CM | POA: Diagnosis not present

## 2022-03-31 DIAGNOSIS — M25572 Pain in left ankle and joints of left foot: Secondary | ICD-10-CM

## 2022-03-31 DIAGNOSIS — M19072 Primary osteoarthritis, left ankle and foot: Secondary | ICD-10-CM

## 2022-03-31 DIAGNOSIS — M79674 Pain in right toe(s): Secondary | ICD-10-CM

## 2022-03-31 DIAGNOSIS — B351 Tinea unguium: Secondary | ICD-10-CM

## 2022-03-31 NOTE — Progress Notes (Signed)
Anesthesia Chart Review  Case: [id number] Date/Time: 04/07/22 0715   Procedure: REVERSE SHOULDER ARTHROPLASTY (Left: Shoulder)   Anesthesia type: Choice   Pre-op diagnosis: LEFT SHOUDLER OSTEOARTHRITIS   Location: [facility] ROOM 07 / [facility]   Surgeons: Tania Ade, MD       DISCUSSION:81 y.o. HTN, left shoulder OA scheduled for above procedure 04/07/2022 with Dr. Tania Ade.   H/o difficult intubation in the past.  Note on chart from procedure in 2004. Per note difficult intubation, Eschmann Intubator used.    Anticipate pt can proceed with planned procedure barring acute status change.    VS: BP (!) 148/78   Pulse 81   Temp 36.6 C (Oral)   Resp 14   Ht '5\' 3"'$  (1.6 m)   Wt 76.7 kg   SpO2 97%   BMI 29.94 kg/m   PROVIDERS: [facility] Commons, FNP is PCP    LABS: Labs reviewed: Acceptable for surgery. (all labs ordered are listed, but only abnormal results are displayed)  Labs Reviewed  BASIC METABOLIC PANEL - Abnormal; Notable for the following components:      Result Value   Glucose, Bld 100 (*)    All other components within normal limits  GLUCOSE, CAPILLARY - Abnormal; Notable for the following components:   Glucose-Capillary 117 (*)    All other components within normal limits  SURGICAL PCR SCREEN  HEMOGLOBIN A1C  CBC  TYPE AND SCREEN     IMAGES:   EKG: On chart   CV:  Past Medical History:  Diagnosis Date   Arthritis    Complication of anesthesia    Pt reports that she was told "someone had to breath for her" She is not clear what this means   Difficult intubation    Diverticulitis    GERD (gastroesophageal reflux disease)    History of COVID-19 05/2020   Hypercholesteremia    Hypertension    Mild cognitive impairment    Peripheral neuropathy    Pre-diabetes    Restless leg syndrome     Past Surgical History:  Procedure Laterality Date   ABDOMINAL HYSTERECTOMY     Age 52   BACK SURGERY     BREAST BIOPSY Left    COLONOSCOPY WITH  PROPOFOL N/A 11/13/2020   Procedure: COLONOSCOPY WITH PROPOFOL;  Surgeon: Carol Ada, MD;  [facility];  Service: Endoscopy;  Laterality: N/A;   ESOPHAGOGASTRODUODENOSCOPY (EGD) WITH PROPOFOL N/A 11/13/2020   Procedure: ESOPHAGOGASTRODUODENOSCOPY (EGD) WITH PROPOFOL;  Surgeon: Carol Ada, MD;  [facility];  Service: Endoscopy;  Laterality: N/A;   EYE SURGERY     bilateral cateract    POLYPECTOMY  11/13/2020   Procedure: POLYPECTOMY;  Surgeon: Carol Ada, MD;  [facility];  Service: Endoscopy;;   TONSILLECTOMY     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   Procedure: TOTAL KNEE ARTHROPLASTY;  Surgeon: Paralee Cancel, MD;  [facility];  Service: Orthopedics;  Laterality: Left;  70 mins    MEDICATIONS:  acetaminophen (TYLENOL) 500 MG tablet   acetaminophen (TYLENOL) 650 MG CR tablet   amLODipine (NORVASC) 2.5 MG tablet   aspirin EC 81 MG tablet   celecoxib (CELEBREX) 50 MG capsule   DULoxetine (CYMBALTA) 60 MG capsule   omeprazole (PRILOSEC) 40 MG capsule   Polyethyl Glycol-Propyl Glycol (SYSTANE OP)   pramipexole (MIRAPEX) 0.25 MG tablet   pregabalin (LYRICA) 150 MG capsule   simvastatin (ZOCOR) 40 MG tablet   traZODone (DESYREL) 50 MG tablet   vitamin B-12 (CYANOCOBALAMIN)  500 MCG tablet   No current facility-administered medications for this encounter.    Konrad Felix Ward, PA-C [facility] [phone]

## 2022-03-31 NOTE — Progress Notes (Signed)
Subjective:   Patient ID: Kimberly [name], female   DOB: 81 y.o.   MRN: [id number]   HPI Chief Complaint  Patient presents with   Foot Pain    Pt came in for Left foot and ankle pain, which start year ago, pt is having some swelling in the ankle, Pt rate the pain 10 out 10, Tx: Pain meds , Xrays Done today    81 year old female presents to the office today with her daughter for second opinion regards to left foot and ankle pain.  This started over a year ago.  She does have some chronic swelling to the ankle.  She does take at times to help with this which is over-the-counter.  She said it feels like it is crushing at times.  She did see another provider for this as well and discussed surgery but she would like another opinion.  Also has concerns of thick, discolored toenails that she is not able to trim himself back to cause discomfort.  Has osteoporosis    Review of Systems  All other systems reviewed and are negative.  Past Medical History:  Diagnosis Date   Arthritis    Complication of anesthesia    Pt reports that she was told "someone had to breath for her" She is not clear what this means   Difficult intubation    Diverticulitis    GERD (gastroesophageal reflux disease)    History of COVID-19 05/2020   Hypercholesteremia    Hypertension    Mild cognitive impairment    Peripheral neuropathy    Pre-diabetes    Restless leg syndrome     Past Surgical History:  Procedure Laterality Date   ABDOMINAL HYSTERECTOMY     Age 1   BACK SURGERY     BREAST BIOPSY Left    COLONOSCOPY WITH PROPOFOL N/A 11/13/2020   Procedure: COLONOSCOPY WITH PROPOFOL;  Surgeon: Carol Ada, MD;  [facility];  Service: Endoscopy;  Laterality: N/A;   ESOPHAGOGASTRODUODENOSCOPY (EGD) WITH PROPOFOL N/A 11/13/2020   Procedure: ESOPHAGOGASTRODUODENOSCOPY (EGD) WITH PROPOFOL;  Surgeon: Carol Ada, MD;  [facility];  Service: Endoscopy;  Laterality: N/A;   EYE SURGERY      bilateral cateract    POLYPECTOMY  11/13/2020   Procedure: POLYPECTOMY;  Surgeon: Carol Ada, MD;  [facility];  Service: Endoscopy;;   TONSILLECTOMY     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 (Patient not taking: Reported on 03/23/2022), Disp: 30 tablet, Rfl: 0   acetaminophen (TYLENOL) 650 MG CR tablet, Take 650 mg by mouth every 8 (eight) hours as needed for pain., Disp: , Rfl:    amLODipine (NORVASC) 2.5 MG tablet, Take 2.5 mg by mouth daily., Disp: , Rfl:    aspirin EC 81 MG tablet, Take 81 mg by mouth daily. Swallow whole., Disp: , Rfl:    celecoxib (CELEBREX) 50 MG capsule, Take 1 capsule (50 mg total) by mouth 2 (two) times daily., Disp: 60 capsule, Rfl: 6   DULoxetine (CYMBALTA) 60 MG capsule, Take 1 capsule (60 mg total) by mouth daily., Disp: 90 capsule, Rfl: 3   omeprazole (PRILOSEC) 40 MG capsule, Take 40 mg by mouth in the morning., Disp: , Rfl:  Polyethyl Glycol-Propyl Glycol (SYSTANE OP), Place 1 drop into both eyes at bedtime., Disp: , Rfl:    pramipexole (MIRAPEX) 0.25 MG tablet, Take 0.25 mg by mouth at bedtime., Disp: , Rfl:    pregabalin (LYRICA) 150 MG capsule, 1 cap am/1 at noon/ 2 at night, Disp: 120 capsule, Rfl: 5   simvastatin (ZOCOR) 40 MG tablet, Take 40 mg by mouth at bedtime. , Disp: , Rfl:    traZODone (DESYREL) 50 MG tablet, TAKE 2 TABLETS BY MOUTH AT BEDTIME. (Patient taking differently: Take 50 mg by mouth at bedtime.), Disp: 180 tablet, Rfl: 4   vitamin B-12 (CYANOCOBALAMIN) 500 MCG tablet, Take 500 mcg by mouth in the morning., Disp: , Rfl:   Allergies  Allergen Reactions   Amoxicillin Swelling    Throat felt swollen Did it involve swelling of the  face/tongue/throat, SOB, or low BP? Yes Did it involve sudden or severe rash/hives, skin peeling, or any reaction on the inside of your mouth or nose? No Did you need to seek medical attention at a hospital or doctor's office? No When did it last happen? More than 30 years ago If all above answers are "NO", may proceed with cephalosporin use.    Ciprofloxacin Itching   Adhesive [Tape] Itching    Redness   Benzoyl Peroxide Itching   Hydrocodone-Acetaminophen Itching   Metronidazole Hives   Oxycodone Other (See Comments)    disoriented   Sulfa Antibiotics Itching and Rash          Objective:  Physical Exam  General: AAO x3, NAD  Dermatological: Nails are hypertrophic, dystrophic with yellow discoloration is notably the hallux toenails.  No edema, erythema to the toenail sites.  She does get tenderness nails 1-5 bilaterally  Vascular: Dorsalis Pedis artery and Posterior Tibial artery pedal pulses are 2/4 bilateral with immedate capillary fill time.  There is no pain with calf compression, swelling, warmth, erythema.   Neruologic: Grossly intact via light touch bilateral.   Musculoskeletal: Chronic appearing edema present to the left ankle.  Tenderness is mostly localized on the anterior, anterior lateral aspect of the ankle joint.  Decreased range of motion of the ankle joint.  Muscular strength 5/5 in all groups tested bilateral.      Assessment:   Arthritis left ankle, symptomatic onychomycosis     [name]:  -Treatment options discussed including all alternatives, risks, and complications -Etiology of symptoms were discussed -X-rays were obtained and reviewed with the patient.  2 views of the left ankle including his left foot were obtained.  Arthritic changes present most often in the ankle as well as the midfoot.  No evidence of acute fracture. -Symptoms likely this is with arthritis of the ankle.  Discussed with concern for possible surgical treatment options.  At this time  she has an upcoming shoulder replacement.  Discussed getting a CT scan of the left ankle for further evaluation and surgical planning.  If she does not want to proceed with surgical invention discussed long-term bracing. -Sharply debrided the nails x10 without any complications or bleeding.  Trula Slade DPM

## 2022-04-05 DIAGNOSIS — K219 Gastro-esophageal reflux disease without esophagitis: Secondary | ICD-10-CM | POA: Diagnosis not present

## 2022-04-05 DIAGNOSIS — G629 Polyneuropathy, unspecified: Secondary | ICD-10-CM | POA: Diagnosis not present

## 2022-04-05 DIAGNOSIS — E785 Hyperlipidemia, unspecified: Secondary | ICD-10-CM | POA: Diagnosis not present

## 2022-04-05 DIAGNOSIS — I10 Essential (primary) hypertension: Secondary | ICD-10-CM | POA: Diagnosis not present

## 2022-04-05 DIAGNOSIS — M2550 Pain in unspecified joint: Secondary | ICD-10-CM | POA: Diagnosis not present

## 2022-04-05 DIAGNOSIS — G2581 Restless legs syndrome: Secondary | ICD-10-CM | POA: Diagnosis not present

## 2022-04-07 ENCOUNTER — Other Ambulatory Visit: Payer: Self-pay

## 2022-04-07 ENCOUNTER — Observation Stay (HOSPITAL_COMMUNITY)
Admission: RE | Admit: 2022-04-07 | Discharge: 2022-04-08 | Disposition: A | Discharge status: Home / Self Care | Payer: Medicare Other | Source: Ambulatory Visit | Attending: Orthopedic Surgery | Admitting: Orthopedic Surgery

## 2022-04-07 ENCOUNTER — Encounter (HOSPITAL_COMMUNITY)
Admission: RE | Disposition: A | Discharge status: Home / Self Care | Payer: Self-pay | Source: Ambulatory Visit | Attending: Orthopedic Surgery

## 2022-04-07 ENCOUNTER — Ambulatory Visit (HOSPITAL_BASED_OUTPATIENT_CLINIC_OR_DEPARTMENT_OTHER): Payer: Medicare Other | Admitting: Anesthesiology

## 2022-04-07 ENCOUNTER — Ambulatory Visit (HOSPITAL_COMMUNITY): Payer: Medicare Other | Admitting: Physician Assistant

## 2022-04-07 ENCOUNTER — Encounter (HOSPITAL_COMMUNITY): Payer: Self-pay | Admitting: Orthopedic Surgery

## 2022-04-07 DIAGNOSIS — M75102 Unspecified rotator cuff tear or rupture of left shoulder, not specified as traumatic: Secondary | ICD-10-CM | POA: Diagnosis not present

## 2022-04-07 DIAGNOSIS — Z8616 Personal history of COVID-19: Secondary | ICD-10-CM | POA: Insufficient documentation

## 2022-04-07 DIAGNOSIS — Z7982 Long term (current) use of aspirin: Secondary | ICD-10-CM | POA: Insufficient documentation

## 2022-04-07 DIAGNOSIS — M19012 Primary osteoarthritis, left shoulder: Principal | ICD-10-CM | POA: Insufficient documentation

## 2022-04-07 DIAGNOSIS — Z96652 Presence of left artificial knee joint: Secondary | ICD-10-CM | POA: Diagnosis not present

## 2022-04-07 DIAGNOSIS — Z96612 Presence of left artificial shoulder joint: Secondary | ICD-10-CM | POA: Diagnosis not present

## 2022-04-07 DIAGNOSIS — G8918 Other acute postprocedural pain: Secondary | ICD-10-CM | POA: Diagnosis not present

## 2022-04-07 DIAGNOSIS — Z79899 Other long term (current) drug therapy: Secondary | ICD-10-CM | POA: Diagnosis not present

## 2022-04-07 DIAGNOSIS — R7303 Prediabetes: Secondary | ICD-10-CM

## 2022-04-07 HISTORY — PX: REVERSE SHOULDER ARTHROPLASTY: SHX5054

## 2022-04-07 LAB — TYPE AND SCREEN
ABO/RH(D): A POS
Antibody Screen: NEGATIVE

## 2022-04-07 SURGERY — ARTHROPLASTY, SHOULDER, TOTAL, REVERSE
Anesthesia: General | Site: Shoulder | Laterality: Left

## 2022-04-07 MED ORDER — PHENYLEPHRINE HCL-NACL 20-0.9 MG/250ML-% IV SOLN
INTRAVENOUS | Status: DC | PRN
  Administered 2022-04-07: 50 ug/min via INTRAVENOUS

## 2022-04-07 MED ORDER — METHOCARBAMOL 500 MG IVPB - SIMPLE MED
500.0000 mg | Freq: Four times a day (QID) | INTRAVENOUS | Status: DC | PRN
  Filled 2022-04-07: qty 55

## 2022-04-07 MED ORDER — TRANEXAMIC ACID-NACL 1000-0.7 MG/100ML-% IV SOLN
1000.0000 mg | INTRAVENOUS | Status: AC
  Administered 2022-04-07: 1000 mg via INTRAVENOUS
  Filled 2022-04-07: qty 100

## 2022-04-07 MED ORDER — LACTATED RINGERS IV SOLN: INTRAVENOUS | Status: DC

## 2022-04-07 MED ORDER — METOCLOPRAMIDE HCL 5 MG/ML IJ SOLN: 5.0000 mg | Freq: Three times a day (TID) | INTRAMUSCULAR | Status: DC | PRN

## 2022-04-07 MED ORDER — DIPHENHYDRAMINE HCL 12.5 MG/5ML PO ELIX: 12.5000 mg | ORAL_SOLUTION | ORAL | Status: DC | PRN

## 2022-04-07 MED ORDER — PREGABALIN 100 MG PO CAPS
300.0000 mg | ORAL_CAPSULE | Freq: Every day | ORAL | Status: DC
  Administered 2022-04-07: 300 mg via ORAL
  Filled 2022-04-07: qty 3

## 2022-04-07 MED ORDER — WATER FOR IRRIGATION, STERILE IR SOLN
Status: DC | PRN
  Administered 2022-04-07: 2000 mL

## 2022-04-07 MED ORDER — SIMVASTATIN 20 MG PO TABS
20.0000 mg | ORAL_TABLET | Freq: Every day | ORAL | Status: DC
Start: 2022-04-07 — End: 2022-04-08
  Administered 2022-04-07: 20 mg via ORAL
  Filled 2022-04-07: qty 1

## 2022-04-07 MED ORDER — MORPHINE SULFATE (PF) 2 MG/ML IV SOLN: 0.5000 mg | INTRAVENOUS | Status: DC | PRN

## 2022-04-07 MED ORDER — ORAL CARE MOUTH RINSE: 15.0000 mL | Freq: Once | OROMUCOSAL | Status: AC

## 2022-04-07 MED ORDER — PHENOL 1.4 % MT LIQD: 1.0000 | OROMUCOSAL | Status: DC | PRN

## 2022-04-07 MED ORDER — PHENYLEPHRINE HCL-NACL 20-0.9 MG/250ML-% IV SOLN
INTRAVENOUS | Status: AC
  Filled 2022-04-07: qty 250

## 2022-04-07 MED ORDER — ACETAMINOPHEN 325 MG PO TABS
325.0000 mg | ORAL_TABLET | Freq: Four times a day (QID) | ORAL | Status: DC | PRN
  Administered 2022-04-08 (×2): 650 mg via ORAL
  Filled 2022-04-07 (×2): qty 2

## 2022-04-07 MED ORDER — ONDANSETRON HCL 4 MG PO TABS: 4.0000 mg | ORAL_TABLET | Freq: Four times a day (QID) | ORAL | Status: DC | PRN

## 2022-04-07 MED ORDER — VANCOMYCIN HCL IN DEXTROSE 1-5 GM/200ML-% IV SOLN
1000.0000 mg | INTRAVENOUS | Status: AC
  Administered 2022-04-07: 1000 mg via INTRAVENOUS
  Filled 2022-04-07: qty 200

## 2022-04-07 MED ORDER — SIMVASTATIN 40 MG PO TABS
40.0000 mg | ORAL_TABLET | Freq: Every day | ORAL | Status: DC
Start: 2022-04-07 — End: 2022-04-07

## 2022-04-07 MED ORDER — POLYETHYLENE GLYCOL 3350 17 G PO PACK: 17.0000 g | PACK | Freq: Every day | ORAL | Status: DC | PRN

## 2022-04-07 MED ORDER — ASPIRIN 81 MG PO TBEC
81.0000 mg | DELAYED_RELEASE_TABLET | Freq: Every day | ORAL | Status: DC
  Administered 2022-04-08: 81 mg via ORAL
  Filled 2022-04-07: qty 1

## 2022-04-07 MED ORDER — PANTOPRAZOLE SODIUM 40 MG PO TBEC
80.0000 mg | DELAYED_RELEASE_TABLET | Freq: Every day | ORAL | Status: DC
  Administered 2022-04-08: 80 mg via ORAL
  Filled 2022-04-07: qty 2

## 2022-04-07 MED ORDER — BUPIVACAINE LIPOSOME 1.3 % IJ SUSP
INTRAMUSCULAR | Status: DC | PRN
  Administered 2022-04-07: 10 mL via PERINEURAL

## 2022-04-07 MED ORDER — VANCOMYCIN HCL IN DEXTROSE 1-5 GM/200ML-% IV SOLN
1000.0000 mg | Freq: Two times a day (BID) | INTRAVENOUS | Status: AC
  Administered 2022-04-07: 1000 mg via INTRAVENOUS
  Filled 2022-04-07: qty 200

## 2022-04-07 MED ORDER — ONDANSETRON HCL 4 MG/2ML IJ SOLN
INTRAMUSCULAR | Status: DC | PRN
  Administered 2022-04-07: 4 mg via INTRAVENOUS

## 2022-04-07 MED ORDER — DULOXETINE HCL 60 MG PO CPEP
60.0000 mg | ORAL_CAPSULE | Freq: Every day | ORAL | Status: DC
  Administered 2022-04-08: 60 mg via ORAL
  Filled 2022-04-07: qty 1

## 2022-04-07 MED ORDER — FENTANYL CITRATE PF 50 MCG/ML IJ SOSY
PREFILLED_SYRINGE | INTRAMUSCULAR | Status: AC
  Filled 2022-04-07: qty 1

## 2022-04-07 MED ORDER — AMLODIPINE BESYLATE 2.5 MG PO TABS
2.5000 mg | ORAL_TABLET | Freq: Every day | ORAL | Status: DC
  Administered 2022-04-08: 2.5 mg via ORAL
  Filled 2022-04-07: qty 1

## 2022-04-07 MED ORDER — CHLORHEXIDINE GLUCONATE 0.12 % MT SOLN
15.0000 mL | Freq: Once | OROMUCOSAL | Status: AC
  Administered 2022-04-07: 15 mL via OROMUCOSAL

## 2022-04-07 MED ORDER — DOCUSATE SODIUM 100 MG PO CAPS
100.0000 mg | ORAL_CAPSULE | Freq: Two times a day (BID) | ORAL | Status: DC
  Administered 2022-04-07 – 2022-04-08 (×3): 100 mg via ORAL
  Filled 2022-04-07 (×3): qty 1

## 2022-04-07 MED ORDER — FENTANYL CITRATE (PF) 100 MCG/2ML IJ SOLN
INTRAMUSCULAR | Status: AC
  Filled 2022-04-07: qty 2

## 2022-04-07 MED ORDER — METHOCARBAMOL 500 MG PO TABS: 500.0000 mg | ORAL_TABLET | Freq: Four times a day (QID) | ORAL | Status: DC | PRN

## 2022-04-07 MED ORDER — METOCLOPRAMIDE HCL 5 MG PO TABS: 5.0000 mg | ORAL_TABLET | Freq: Three times a day (TID) | ORAL | Status: DC | PRN

## 2022-04-07 MED ORDER — FENTANYL CITRATE PF 50 MCG/ML IJ SOSY: 25.0000 ug | PREFILLED_SYRINGE | INTRAMUSCULAR | Status: DC | PRN

## 2022-04-07 MED ORDER — ONDANSETRON HCL 4 MG/2ML IJ SOLN
4.0000 mg | Freq: Once | INTRAMUSCULAR | Status: DC | PRN
Start: 2022-04-07 — End: 2022-04-07

## 2022-04-07 MED ORDER — DEXAMETHASONE SODIUM PHOSPHATE 10 MG/ML IJ SOLN
INTRAMUSCULAR | Status: DC | PRN
  Administered 2022-04-07: 4 mg via INTRAVENOUS

## 2022-04-07 MED ORDER — MENTHOL 3 MG MT LOZG: 1.0000 | LOZENGE | OROMUCOSAL | Status: DC | PRN

## 2022-04-07 MED ORDER — SODIUM CHLORIDE 0.9 % IR SOLN
Status: DC | PRN
  Administered 2022-04-07: 1000 mL

## 2022-04-07 MED ORDER — CYANOCOBALAMIN 500 MCG PO TABS
500.0000 ug | ORAL_TABLET | Freq: Every day | ORAL | Status: DC
  Administered 2022-04-08: 500 ug via ORAL
  Filled 2022-04-07: qty 1

## 2022-04-07 MED ORDER — PREGABALIN 75 MG PO CAPS
150.0000 mg | ORAL_CAPSULE | Freq: Two times a day (BID) | ORAL | Status: DC
  Administered 2022-04-07 – 2022-04-08 (×2): 150 mg via ORAL
  Filled 2022-04-07 (×2): qty 2

## 2022-04-07 MED ORDER — 0.9 % SODIUM CHLORIDE (POUR BTL) OPTIME
TOPICAL | Status: DC | PRN
  Administered 2022-04-07: 1000 mL

## 2022-04-07 MED ORDER — PRAMIPEXOLE DIHYDROCHLORIDE 0.25 MG PO TABS
0.2500 mg | ORAL_TABLET | Freq: Every day | ORAL | Status: DC
  Administered 2022-04-07: 0.25 mg via ORAL
  Filled 2022-04-07: qty 1

## 2022-04-07 MED ORDER — TRAMADOL HCL 50 MG PO TABS: 50.0000 mg | ORAL_TABLET | Freq: Four times a day (QID) | ORAL | Status: DC | PRN

## 2022-04-07 MED ORDER — FENTANYL CITRATE (PF) 100 MCG/2ML IJ SOLN
INTRAMUSCULAR | Status: DC | PRN
  Administered 2022-04-07 (×2): 50 ug via INTRAVENOUS
  Administered 2022-04-07: 100 ug via INTRAVENOUS

## 2022-04-07 MED ORDER — POTASSIUM CHLORIDE IN NACL 20-0.9 MEQ/L-% IV SOLN
INTRAVENOUS | Status: DC
  Filled 2022-04-07 (×2): qty 1000

## 2022-04-07 MED ORDER — ACETAMINOPHEN 10 MG/ML IV SOLN: 1000.0000 mg | Freq: Once | INTRAVENOUS | Status: DC | PRN

## 2022-04-07 MED ORDER — ALUM & MAG HYDROXIDE-SIMETH 200-200-20 MG/5ML PO SUSP: 30.0000 mL | ORAL | Status: DC | PRN

## 2022-04-07 MED ORDER — TRAZODONE HCL 50 MG PO TABS
50.0000 mg | ORAL_TABLET | Freq: Every day | ORAL | Status: DC
  Administered 2022-04-07: 50 mg via ORAL
  Filled 2022-04-07: qty 1

## 2022-04-07 MED ORDER — PROPOFOL 10 MG/ML IV BOLUS
INTRAVENOUS | Status: AC
  Filled 2022-04-07: qty 20

## 2022-04-07 MED ORDER — ONDANSETRON HCL 4 MG/2ML IJ SOLN: 4.0000 mg | Freq: Four times a day (QID) | INTRAMUSCULAR | Status: DC | PRN

## 2022-04-07 MED ORDER — EPHEDRINE SULFATE (PRESSORS) 50 MG/ML IJ SOLN
INTRAMUSCULAR | Status: DC | PRN
  Administered 2022-04-07: 5 mg via INTRAVENOUS
  Administered 2022-04-07 (×2): 10 mg via INTRAVENOUS

## 2022-04-07 MED ORDER — BUPIVACAINE HCL (PF) 0.5 % IJ SOLN
INTRAMUSCULAR | Status: DC | PRN
  Administered 2022-04-07: 15 mL via PERINEURAL

## 2022-04-07 MED ORDER — GLYCOPYRROLATE 0.2 MG/ML IJ SOLN
INTRAMUSCULAR | Status: DC | PRN
  Administered 2022-04-07: .2 mg via INTRAVENOUS

## 2022-04-07 SURGICAL SUPPLY — 78 items
BAG COUNTER SPONGE SURGICOUNT (BAG) IMPLANT
BAG ZIPLOCK 12X15 (MISCELLANEOUS) ×2 IMPLANT
BASEPLATE P2 COATD GLND 6.5X30 (Shoulder) IMPLANT
BIT DRILL 1.6MX128 (BIT) IMPLANT
BIT DRILL 2.5 DIA 127 CALI (BIT) ×1 IMPLANT
BIT DRILL 4 DIA CALIBRATED (BIT) ×1 IMPLANT
BLADE SAW SGTL 73X25 THK (BLADE) ×2 IMPLANT
BOOTIES KNEE HIGH SLOAN (MISCELLANEOUS) ×4 IMPLANT
CLSR STERI-STRIP ANTIMIC 1/2X4 (GAUZE/BANDAGES/DRESSINGS) ×1 IMPLANT
COOLER ICEMAN CLASSIC (MISCELLANEOUS) ×1 IMPLANT
COVER BACK TABLE 60X90IN (DRAPES) ×2 IMPLANT
COVER SURGICAL LIGHT HANDLE (MISCELLANEOUS) ×2 IMPLANT
DRAPE INCISE IOBAN 66X45 STRL (DRAPES) ×2 IMPLANT
DRAPE ORTHO SPLIT 77X108 STRL (DRAPES) ×2
DRAPE POUCH INSTRU U-SHP 10X18 (DRAPES) ×2 IMPLANT
DRAPE SHEET LG 3/4 BI-LAMINATE (DRAPES) ×2 IMPLANT
DRAPE SURG 17X11 SM STRL (DRAPES) ×2 IMPLANT
DRAPE SURG ORHT 6 SPLT 77X108 (DRAPES) ×2 IMPLANT
DRAPE TOP 10253 STERILE (DRAPES) ×2 IMPLANT
DRAPE U-SHAPE 47X51 STRL (DRAPES) ×2 IMPLANT
DRSG AQUACEL AG ADV 3.5X 6 (GAUZE/BANDAGES/DRESSINGS) ×2 IMPLANT
DURAPREP 26ML APPLICATOR (WOUND CARE) ×4 IMPLANT
ELECT BLADE TIP CTD 4 INCH (ELECTRODE) ×2 IMPLANT
ELECT REM PT RETURN 15FT ADLT (MISCELLANEOUS) ×2 IMPLANT
FACESHIELD WRAPAROUND (MASK) ×2 IMPLANT
FACESHIELD WRAPAROUND OR TEAM (MASK) ×1 IMPLANT
GLOVE BIO SURGEON STRL SZ7.5 (GLOVE) ×2 IMPLANT
GLOVE BIOGEL PI IND STRL 6.5 (GLOVE) ×1 IMPLANT
GLOVE BIOGEL PI IND STRL 8 (GLOVE) ×1 IMPLANT
GLOVE BIOGEL PI INDICATOR 6.5 (GLOVE) ×1
GLOVE BIOGEL PI INDICATOR 8 (GLOVE) ×1
GLOVE SURG SS PI 6.5 STRL IVOR (GLOVE) ×2 IMPLANT
GOWN STRL REUS W/ TWL LRG LVL3 (GOWN DISPOSABLE) ×1 IMPLANT
GOWN STRL REUS W/ TWL XL LVL3 (GOWN DISPOSABLE) ×1 IMPLANT
GOWN STRL REUS W/TWL LRG LVL3 (GOWN DISPOSABLE) ×1
GOWN STRL REUS W/TWL XL LVL3 (GOWN DISPOSABLE) ×1
HANDPIECE INTERPULSE COAX TIP (DISPOSABLE) ×1
HOOD PEEL AWAY FLYTE STAYCOOL (MISCELLANEOUS) ×6 IMPLANT
INSERT SMALL SOCKET 32MM NEU (Insert) ×1 IMPLANT
KIT BASIN OR (CUSTOM PROCEDURE TRAY) ×2 IMPLANT
KIT TURNOVER KIT A (KITS) IMPLANT
MANIFOLD NEPTUNE II (INSTRUMENTS) ×2 IMPLANT
NDL TROCAR POINT SZ 2 1/2 (NEEDLE) IMPLANT
NEEDLE TROCAR POINT SZ 2 1/2 (NEEDLE) IMPLANT
NS IRRIG 1000ML POUR BTL (IV SOLUTION) ×2 IMPLANT
P2 COATDE GLNOID BSEPLT 6.5X30 (Shoulder) ×2 IMPLANT
PACK SHOULDER (CUSTOM PROCEDURE TRAY) ×2 IMPLANT
PAD COLD SHLDR WRAP-ON (PAD) ×1 IMPLANT
PROTECTOR NERVE ULNAR (MISCELLANEOUS) IMPLANT
RESTRAINT HEAD UNIVERSAL NS (MISCELLANEOUS) ×2 IMPLANT
RETRIEVER SUT HEWSON (MISCELLANEOUS) IMPLANT
SCREW BONE LOCKING RSP 5.0X14 (Screw) ×4 IMPLANT
SCREW BONE LOCKING RSP 5.0X30 (Screw) ×2 IMPLANT
SCREW BONE RSP LOCK 5X14 (Screw) IMPLANT
SCREW BONE RSP LOCK 5X26 (Screw) IMPLANT
SCREW BONE RSP LOCK 5X30 (Screw) IMPLANT
SCREW BONE RSP LOCKING 5.0X26 (Screw) ×2 IMPLANT
SCREW RETAIN W/HEAD 4MM OFFSET (Shoulder) ×1 IMPLANT
SET HNDPC FAN SPRY TIP SCT (DISPOSABLE) ×1 IMPLANT
SLING ARM FOAM STRAP MED (SOFTGOODS) ×1 IMPLANT
SLING ARM IMMOBILIZER LRG (SOFTGOODS) IMPLANT
SLING ARM IMMOBILIZER MED (SOFTGOODS) ×1 IMPLANT
STEM HUMERAL 8X48 SHOULDER (Miscellaneous) ×1 IMPLANT
STRIP CLOSURE SKIN 1/2X4 (GAUZE/BANDAGES/DRESSINGS) ×4 IMPLANT
SUCTION FRAZIER HANDLE 10FR (MISCELLANEOUS)
SUCTION TUBE FRAZIER 10FR DISP (MISCELLANEOUS) IMPLANT
SUPPORT WRAP ARM LG (MISCELLANEOUS) IMPLANT
SUT ETHIBOND 2 V 37 (SUTURE) IMPLANT
SUT FIBERWIRE #2 38 REV NDL BL (SUTURE)
SUT MNCRL AB 4-0 PS2 18 (SUTURE) ×2 IMPLANT
SUT VIC AB 2-0 CT1 27 (SUTURE) ×2
SUT VIC AB 2-0 CT1 TAPERPNT 27 (SUTURE) ×2 IMPLANT
SUTURE FIBERWR#2 38 REV NDL BL (SUTURE) IMPLANT
TAPE LABRALWHITE 1.5X36 (TAPE) IMPLANT
TAPE SUT LABRALTAP WHT/BLK (SUTURE) IMPLANT
TOWEL OR 17X26 10 PK STRL BLUE (TOWEL DISPOSABLE) ×2 IMPLANT
TOWEL OR NON WOVEN STRL DISP B (DISPOSABLE) ×2 IMPLANT
WATER STERILE IRR 1000ML POUR (IV SOLUTION) ×2 IMPLANT

## 2022-04-07 NOTE — Plan of Care (Signed)
  Problem: Education: Goal: Knowledge of General Education information will improve Description: Including pain rating scale, medication(s)/side effects and non-pharmacologic comfort measures Outcome: Progressing   Problem: Clinical Measurements: Goal: Ability to maintain clinical measurements within normal limits will improve Outcome: Progressing   Problem: Pain Managment: Goal: General experience of comfort will improve Outcome: Progressing   Problem: Skin Integrity: Goal: Risk for impaired skin integrity will decrease Outcome: Progressing   Problem: Education: Goal: Knowledge of the prescribed therapeutic regimen will improve Outcome: Progressing   Problem: Activity: Goal: Ability to tolerate increased activity will improve Outcome: Progressing

## 2022-04-07 NOTE — Discharge Instructions (Signed)

## 2022-04-07 NOTE — Op Note (Signed)
Procedure(s): REVERSE SHOULDER ARTHROPLASTY Procedure Note  Kimberly [name] female 81 y.o. 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 female  With endstage left shoulder arthritis with rotator cuff tear. Pain and dysfunction interfered with quality of life and nonoperative treatment with activity modification, NSAIDS and injections failed.     Surgeon: Rhae Hammock   Assistants: Sheryle Hail PA-C Amber was present and scrubbed throughout the procedure and was essential in positioning, retraction, exposure, and closure)  Anesthesia: General endotracheal anesthesia with preoperative interscalene block given by the attending anesthesiologist    Procedure Detail  REVERSE SHOULDER ARTHROPLASTY   Estimated Blood Loss:  200 mL         Drains: none  Blood Given: none          Specimens: none        Complications:  * No complications entered in OR log *         Disposition: PACU - hemodynamically stable.         Condition: stable      OPERATIVE FINDINGS:  A DJO Altivate pressfit reverse total shoulder arthroplasty was placed with a  size 8 stem, a 32-4 glenosphere, and a standard-mm poly insert. The base plate  fixation was excellent.  PROCEDURE: The patient was identified in the preoperative holding area  where I personally marked the operative site after verifying site, side,  and procedure with the patient. An interscalene block given by  the attending anesthesiologist in the holding area and the patient was taken back to the operating room where all extremities were  carefully padded in position after general anesthesia was induced. She  was placed in a beach-chair position and the operative upper extremity was  prepped and draped in a standard  sterile fashion. An approximately 10-  cm incision was made from the tip of the coracoid process to the center  point of the humerus at the level of the axilla. Dissection was carried  down through subcutaneous tissues to the level of the cephalic vein  which was taken laterally with the deltoid. The pectoralis major was  retracted medially. The subdeltoid space was developed and the lateral  edge of the conjoined tendon was identified. The undersurface of  conjoined tendon was palpated and the musculocutaneous nerve was not in  the field. Retractor was placed underneath the conjoined and second  retractor was placed lateral into the deltoid. The circumflex humeral  artery and vessels were identified and clamped and coagulated. The  biceps tendon was tenotomized.  The subscapularis was taken down as a peel with the underlying capsule.  The  joint was then gently externally rotated while the capsule was released  from the humeral neck around to just beyond the 6 o'clock position. At  this point, the joint was dislocated and the humeral head was presented  into the wound. The excessive osteophyte formation was removed with a  large rongeur.  The cutting guide was used to make the appropriate  head cut and the head was saved for potentially bone grafting.  The glenoid was exposed with the arm in an  abducted extended position. The anterior and posterior labrum were  completely excised and the capsule was released circumferentially to  allow for exposure of the glenoid for preparation. The 2.5 mm drill was  placed  using the guide in 5-10 inferior angulation and the tap was then advanced in the same hole. Small and large reamers were then used. The tap was then removed and the Metaglene was then screwed in with excellent purchase.  The peripheral guide was then used to drilled measured and filled peripheral locking screws. The size 32-4 glenosphere was then impacted on the [facility] taper and the  central screw was placed. The humerus was then again exposed and the diaphyseal reamers were used followed by the metaphyseal reamers. The final broach was left in place in the proximal trial was placed. The joint was reduced and with this implant it was felt that soft tissue tensioning was appropriate with excellent stability and excellent range of motion. Therefore, final humeral stem was placed press-fit.  And then the trial polyethylene inserts were tested again and the above implant was felt to be the most appropriate for final insertion. The joint was reduced taken through full range of motion and felt to be stable. Soft tissue tension was appropriate.  The joint was then copiously irrigated with pulse  lavage and the wound was then closed. The subscapularis was not repaired.  Skin was closed with 2-0 Vicryl in a deep dermal layer and 4-0  Monocryl for skin closure. Steri-Strips were applied. Sterile  dressings were then applied as well as a sling. The patient was allowed  to awaken from general anesthesia, transferred to stretcher, and taken  to recovery room in stable condition.   POSTOPERATIVE PLAN: The patient will be observed in the hospital postoperatively overnight for pain control and therapy.

## 2022-04-07 NOTE — Plan of Care (Signed)
  Problem: Education: Goal: Knowledge of the prescribed therapeutic regimen will improve Outcome: Progressing   Problem: Activity: Goal: Ability to tolerate increased activity will improve Outcome: Progressing   Problem: Pain Management: Goal: Pain level will decrease with appropriate interventions Outcome: Progressing   

## 2022-04-07 NOTE — Anesthesia Postprocedure Evaluation (Signed)
Anesthesia Post Note  Patient: Kimberly [name]  Procedure(s) Performed: REVERSE SHOULDER ARTHROPLASTY (Left: Shoulder)     Patient location during evaluation: PACU Anesthesia Type: General Level of consciousness: awake and alert Pain management: pain level controlled Vital Signs Assessment: post-procedure vital signs reviewed and stable Respiratory status: spontaneous breathing, nonlabored ventilation and respiratory function stable Cardiovascular status: blood pressure returned to baseline and stable Postop Assessment: no apparent nausea or vomiting Anesthetic complications: no   No notable events documented.  Last Vitals:  Vitals:   04/07/22 1000 04/07/22 1211  BP: 126/65 128/69  Pulse: 91 95  Resp: 17 18  Temp: 36.4 C (!) 36.4 C  SpO2: 90% 96%    Last Pain:  Vitals:   04/07/22 1000  TempSrc: Axillary  PainSc: 0-No pain                 Lidia Collum

## 2022-04-07 NOTE — Anesthesia Procedure Notes (Signed)
Procedure Name: Intubation Date/Time: 04/07/2022 7:38 AM  Performed by: Jonna Munro, CRNAPre-anesthesia Checklist: Patient identified, Emergency Drugs available, Suction available, Patient being monitored and Timeout performed Patient Re-evaluated:Patient Re-evaluated prior to induction Oxygen Delivery Method: Circle system utilized Preoxygenation: Pre-oxygenation with 100% oxygen Induction Type: IV induction and Rapid sequence Laryngoscope Size: Mac, Glidescope and 3 Grade View: Grade II Tube type: Oral Tube size: 7.0 mm Number of attempts: 1 Airway Equipment and Method: Rigid stylet and Video-laryngoscopy Placement Confirmation: ETT inserted through vocal cords under direct vision, positive ETCO2, CO2 detector and breath sounds checked- equal and bilateral Secured at: 22 cm Tube secured with: Tape Dental Injury: Teeth and Oropharynx as per pre-operative assessment

## 2022-04-07 NOTE — H&P (Signed)
Kimberly [name] is an 81 y.o. female.   Chief Complaint: L shoulder pain and dysfunction HPI: Endstage L shoulder arthritis with significant pain and dysfunction, failed conservative measures.  Pain interferes with sleep and quality of life.   Past Medical History:  Diagnosis Date   Arthritis    Complication of anesthesia    Pt reports that she was told "someone had to breath for her" She is not clear what this means   Difficult intubation    Diverticulitis    GERD (gastroesophageal reflux disease)    History of COVID-19 05/2020   Hypercholesteremia    Hypertension    Mild cognitive impairment    Peripheral neuropathy    Pre-diabetes    Restless leg syndrome     Past Surgical History:  Procedure Laterality Date   ABDOMINAL HYSTERECTOMY     Age 9   BACK SURGERY     BREAST BIOPSY Left    COLONOSCOPY WITH PROPOFOL N/A 11/13/2020   Procedure: COLONOSCOPY WITH PROPOFOL;  Surgeon: Carol Ada, MD;  Location: [facility];  Service: Endoscopy;  Laterality: N/A;   ESOPHAGOGASTRODUODENOSCOPY (EGD) WITH PROPOFOL N/A 11/13/2020   Procedure: ESOPHAGOGASTRODUODENOSCOPY (EGD) WITH PROPOFOL;  Surgeon: Carol Ada, MD;  Location: [facility];  Service: Endoscopy;  Laterality: N/A;   EYE SURGERY     bilateral cateract    POLYPECTOMY  11/13/2020   Procedure: POLYPECTOMY;  Surgeon: Carol Ada, MD;  Location: [facility];  Service: Endoscopy;;   TONSILLECTOMY     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   Procedure: TOTAL KNEE ARTHROPLASTY;  Surgeon: Paralee Cancel, MD;  Location: [facility];  Service: Orthopedics;  Laterality: Left;  70 mins    Family History  Problem Relation Age of Onset   Breast cancer Sister    Other Mother        passed away at age 76   Heart attack Father        passed away at age 80   Social History:  reports that she has never smoked. She has never used smokeless tobacco. She reports that she does not drink alcohol and does not use  drugs.  Allergies:  Allergies  Allergen Reactions   Amoxicillin Swelling    Throat felt swollen Did it involve swelling of the face/tongue/throat, SOB, or low BP? Yes Did it involve sudden or severe rash/hives, skin peeling, or any reaction on the inside of your mouth or nose? No Did you need to seek medical attention at a hospital or doctor's office? No When did it last happen? More than 30 years ago If all above answers are "NO", may proceed with cephalosporin use.    Ciprofloxacin Itching   Adhesive [Tape] Itching    Redness   Benzoyl Peroxide Itching   Hydrocodone-Acetaminophen Itching   Metronidazole Hives   Oxycodone Other (See Comments)    disoriented   Sulfa Antibiotics Itching and Rash    Medications Prior to Admission  Medication Sig Dispense Refill   acetaminophen (TYLENOL) 650 MG CR tablet Take 650 mg by mouth every 8 (eight) hours as needed for pain.     amLODipine (NORVASC) 2.5 MG tablet Take 2.5 mg by mouth daily.     aspirin EC 81 MG tablet Take 81 mg by mouth daily. Swallow whole.     celecoxib (CELEBREX) 50 MG capsule Take 1 capsule (50 mg total) by mouth 2 (two) times daily. 60 capsule 6   DULoxetine (CYMBALTA) 60 MG capsule Take 1 capsule (60 mg total) by  mouth daily. 90 capsule 3   omeprazole (PRILOSEC) 40 MG capsule Take 40 mg by mouth in the morning.     Polyethyl Glycol-Propyl Glycol (SYSTANE OP) Place 1 drop into both eyes at bedtime.     pramipexole (MIRAPEX) 0.25 MG tablet Take 0.25 mg by mouth at bedtime.     pregabalin (LYRICA) 150 MG capsule 1 cap am/1 at noon/ 2 at night 120 capsule 5   simvastatin (ZOCOR) 40 MG tablet Take 40 mg by mouth at bedtime.      traZODone (DESYREL) 50 MG tablet TAKE 2 TABLETS BY MOUTH AT BEDTIME. (Patient taking differently: Take 50 mg by mouth at bedtime.) 180 tablet 4   vitamin B-12 (CYANOCOBALAMIN) 500 MCG tablet Take 500 mcg by mouth in the morning.     acetaminophen (TYLENOL) 500 MG tablet Take 2 tablets (1,000 mg  total) by mouth every 8 (eight) hours. (Patient not taking: Reported on 03/23/2022) 30 tablet 0    No results found for this or any previous visit (from the past 48 hour(s)). No results found.  Review of Systems  All other systems reviewed and are negative.   Blood pressure (!) 151/81, pulse 87, temperature 98.3 F (36.8 C), temperature source Oral, resp. rate 16, height '5\' 3"'$  (1.6 m), weight 76.7 kg, SpO2 97 %. Physical Exam HENT:     Head: Atraumatic.  Eyes:     Extraocular Movements: Extraocular movements intact.  Cardiovascular:     Pulses: Normal pulses.  Pulmonary:     Effort: Pulmonary effort is normal.  Musculoskeletal:     Comments: L shoulder pain with limited ROM. NVID.  Skin:    General: Skin is warm.  Neurological:     Mental Status: She is alert.  Psychiatric:        Mood and Affect: Mood normal.      Assessment/Plan L shoulder endstage osteoarthritis with rotator cuff disease Plan L reverse TSA Risks / benefits of surgery discussed Consent on chart  NPO for OR Preop antibiotics   Rhae Hammock, MD 04/07/2022, 7:08 AM

## 2022-04-07 NOTE — Anesthesia Procedure Notes (Signed)
Anesthesia Procedure Image    

## 2022-04-07 NOTE — Anesthesia Procedure Notes (Signed)
Anesthesia Regional Block: Interscalene brachial plexus block   Pre-Anesthetic Checklist: , timeout performed,  Correct Patient, Correct Site, Correct Laterality,  Correct Procedure, Correct Position, site marked,  Risks and benefits discussed,  Surgical consent,  Pre-op evaluation,  At surgeon's request and post-op pain management  Laterality: Left  Prep: chloraprep       Needles:  Injection technique: Single-shot  Needle Type: Echogenic Stimulator Needle     Needle Length: 9cm      Additional Needles:   Procedures:,,,, ultrasound used (permanent image in chart),,     Nerve Stimulator or Paresthesia:  Response: 0.48 mA  Additional Responses:   Narrative:  Start time: 04/07/2022 6:46 AM End time: 04/07/2022 6:56 AM Injection made incrementally with aspirations every 5 mL.  Performed by: Personally  Anesthesiologist: Myrtie Soman, MD  Additional Notes: Patient tolerated the procedure well without complications

## 2022-04-07 NOTE — Anesthesia Preprocedure Evaluation (Signed)
Anesthesia Evaluation  Patient identified by MRN, date of birth, ID band Patient awake    Reviewed: Allergy & Precautions, NPO status , Patient's Chart, lab work & pertinent test results  History of Anesthesia Complications (+) DIFFICULT AIRWAY and history of anesthetic complications  Airway Mallampati: IV  TM Distance: <3 FB Neck ROM: Full    Dental no notable dental hx.    Pulmonary neg pulmonary ROS,    Pulmonary exam normal breath sounds clear to auscultation       Cardiovascular hypertension, Pt. on medications Normal cardiovascular exam Rhythm:Regular Rate:Normal     Neuro/Psych negative neurological ROS  negative psych ROS   GI/Hepatic Neg liver ROS, GERD  ,  Endo/Other  negative endocrine ROS  Renal/GU negative Renal ROS  negative genitourinary   Musculoskeletal  (+) Arthritis , Osteoarthritis,    Abdominal   Peds negative pediatric ROS (+)  Hematology negative hematology ROS (+)   Anesthesia Other Findings   Reproductive/Obstetrics negative OB ROS                             Anesthesia Physical Anesthesia Plan  ASA: 2  Anesthesia Plan: General   Post-op Pain Management: Regional block*   Induction: Intravenous  PONV Risk Score and Plan: 3 and Dexamethasone, Ondansetron and Treatment may vary due to age or medical condition  Airway Management Planned: Oral ETT and Video Laryngoscope Planned  Additional Equipment:   Intra-op Plan:   Post-operative Plan: Extubation in OR  Informed Consent: I have reviewed the patients History and Physical, chart, labs and discussed the procedure including the risks, benefits and alternatives for the proposed anesthesia with the patient or authorized representative who has indicated his/her understanding and acceptance.     Dental advisory given  Plan Discussed with: CRNA and Surgeon  Anesthesia Plan Comments:          Anesthesia Quick Evaluation

## 2022-04-07 NOTE — Transfer of Care (Signed)
Immediate Anesthesia Transfer of Care Note  Patient: Kimberly [name]  Procedure(s) Performed: REVERSE SHOULDER ARTHROPLASTY (Left: Shoulder)  Patient Location: PACU  Anesthesia Type:General  Level of Consciousness: awake, alert , oriented and patient cooperative  Airway & Oxygen Therapy: Patient Spontanous Breathing and Patient connected to face mask oxygen  Post-op Assessment: Report given to RN, Post -op Vital signs reviewed and stable and Patient moving all extremities X 4  Post vital signs: Reviewed and stable  Last Vitals:  Vitals Value Taken Time  BP 143/68 04/07/22 0900  Temp    Pulse 111 04/07/22 0901  Resp 16 04/07/22 0901  SpO2 100 % 04/07/22 0901  Vitals shown include unvalidated device data.  Last Pain:  Vitals:   04/07/22 0600  TempSrc:   PainSc: 0-No pain      Patients Stated Pain Goal: 4 (01/00/71 2197)  Complications: No notable events documented.

## 2022-04-08 ENCOUNTER — Encounter (HOSPITAL_COMMUNITY): Payer: Self-pay | Admitting: Orthopedic Surgery

## 2022-04-08 DIAGNOSIS — Z96652 Presence of left artificial knee joint: Secondary | ICD-10-CM | POA: Diagnosis not present

## 2022-04-08 DIAGNOSIS — Z79899 Other long term (current) drug therapy: Secondary | ICD-10-CM | POA: Diagnosis not present

## 2022-04-08 DIAGNOSIS — Z8616 Personal history of COVID-19: Secondary | ICD-10-CM | POA: Diagnosis not present

## 2022-04-08 DIAGNOSIS — Z7982 Long term (current) use of aspirin: Secondary | ICD-10-CM | POA: Diagnosis not present

## 2022-04-08 DIAGNOSIS — M19012 Primary osteoarthritis, left shoulder: Secondary | ICD-10-CM | POA: Diagnosis not present

## 2022-04-08 MED ORDER — TRAMADOL HCL 50 MG PO TABS
50.0000 mg | ORAL_TABLET | Freq: Four times a day (QID) | ORAL | 0 refills | Status: DC | PRN
Start: 2022-04-08 — End: 2023-07-07

## 2022-04-08 MED ORDER — TIZANIDINE HCL 2 MG PO TABS: 2.0000 mg | ORAL_TABLET | Freq: Three times a day (TID) | ORAL | 0 refills | Status: DC | PRN

## 2022-04-08 NOTE — Progress Notes (Signed)
Patient discharged to home w/ dtr. Given all belongings, instructions, sling. Verbalized understanding of all instructions. Escorted to pov via w/c.

## 2022-04-08 NOTE — Progress Notes (Signed)
Transition of Care [facility]) Screening Note  Patient Details  Name: Kimberly [name] Date of Birth: Jun 07, 1941  Transition of Care [facility]) CM/SW Contact:    Sherie Don, LCSW Phone Number: 04/08/2022, 9:17 AM  Transition of Care Department [facility]) has reviewed patient and no TOC needs have been identified at this time. We will continue to monitor patient advancement through interdisciplinary progression rounds. If new patient transition needs arise, please place a TOC consult.

## 2022-04-08 NOTE — Evaluation (Signed)
Occupational Therapy Evaluation Patient Details Name: Kimberly [name] MRN: [id number] DOB: [birth year] Today's Date: 04/08/2022   History of Present Illness Patient is an 81 year old woman s/p L reverse shoulder replacement.   Clinical Impression   Kimberly [name] is an 81 year old woman s/p shoulder replacement without functional use of left non-dominant upper extremity secondary to effects of surgery and interscalene block and shoulder precautions. Therapist provided education and instruction to patient and daughter in regards to exercises, precautions, positioning, donning upper extremity clothing and bathing while maintaining shoulder precautions, ice and edema management with cooler and cuff and donning/doffing sling. Patient's daughter lives next door and is planning on checking up on and helping mother very frequently. Discussed that patient may do better sleeping in recliner/lift chair. Patient and daughter verbalized understanding and demonstrated as needed. Patient to follow up with MD for further therapy needs.        Recommendations for follow up therapy are one component of a multi-disciplinary discharge planning process, led by the attending physician.  Recommendations may be updated based on patient status, additional functional criteria and insurance authorization.   Follow Up Recommendations  Follow physician's recommendations for discharge plan and follow up therapies    Assistance Recommended at Discharge Intermittent Supervision/Assistance  Patient can return home with the following A little help with bathing/dressing/bathroom;Assistance with cooking/housework;Help with stairs or ramp for entrance    Functional Status Assessment  Patient has had a recent decline in their functional status and demonstrates the ability to make significant improvements in function in a reasonable and predictable amount of time.  Equipment Recommendations  None recommended by OT     Recommendations for Other Services       Precautions / Restrictions Precautions Precautions: Shoulder Type of Shoulder Precautions: No active ROm, No Passive ROM Shoulder Interventions: Shoulder sling/immobilizer;Off for dressing/bathing/exercises Precaution Booklet Issued: Yes (comment) Required Braces or Orthoses: Sling Restrictions Weight Bearing Restrictions: Yes LUE Weight Bearing: Non weight bearing         Balance Overall balance assessment: Mild deficits observed, not formally tested                                         ADL either performed or assessed with clinical judgement   ADL Overall ADL's : Needs assistance/impaired Eating/Feeding: Set up   Grooming: Modified independent   Upper Body Bathing: Moderate assistance   Lower Body Bathing: Minimal assistance   Upper Body Dressing : Maximal assistance   Lower Body Dressing: +2 for safety/equipment   Toilet Transfer: Supervision/safety   Toileting- Clothing Manipulation and Hygiene: Supervision/safety;Sitting/lateral lean       Functional mobility during ADLs: Min guard General ADL Comments: Min guard to ambulate in room.     Vision Baseline Vision/History: 1 Wears glasses Patient Visual Report: No change from baseline       Perception     Praxis      Pertinent Vitals/Pain Pain Assessment Pain Assessment: No/denies pain     Hand Dominance Right   Extremity/Trunk Assessment Upper Extremity Assessment Upper Extremity Assessment: LUE deficits/detail LUE Deficits / Details: grossly functonal ROM of elbow, forearm, wrist and fingers. Shoulder limited by precautions LUE Coordination:  (functional coordination)   Lower Extremity Assessment Lower Extremity Assessment: Overall WFL for tasks assessed   Cervical / Trunk Assessment Cervical / Trunk Assessment: Kyphotic   Communication Communication Communication: No  difficulties   Cognition Arousal/Alertness:  Awake/alert Behavior During Therapy: WFL for tasks assessed/performed Overall Cognitive Status: Within Functional Limits for tasks assessed                                       General Comments       Exercises     Shoulder Instructions Shoulder Instructions Donning/doffing shirt without moving shoulder: Caregiver independent with task Method for sponge bathing under operated UE: Caregiver independent with task Donning/doffing sling/immobilizer: Caregiver independent with task Correct positioning of sling/immobilizer: Caregiver independent with task ROM for elbow, wrist and digits of operated UE: Caregiver independent with task Sling wearing schedule (on at all times/off for ADL's): Caregiver independent with task Proper positioning of operated UE when showering: Caregiver independent with task Dressing change: Caregiver independent with task Positioning of UE while sleeping: Caregiver independent with task    Home Living Family/patient expects to be discharged to:: Private residence Living Arrangements: Alone Available Help at Discharge: Family;Available PRN/intermittently                                    Prior Functioning/Environment Prior Level of Function : Independent/Modified Independent                        OT Problem List: Decreased strength;Decreased range of motion;Impaired UE functional use      OT Treatment/Interventions:      OT Goals(Current goals can be found in the care plan section) Acute Rehab OT Goals OT Goal Formulation: All assessment and education complete, DC therapy  OT Frequency:      Co-evaluation              AM-PAC OT "6 Clicks" Daily Activity     Outcome Measure Help from another person eating meals?: A Little Help from another person taking care of personal grooming?: None Help from another person toileting, which includes using toliet, bedpan, or urinal?: A Little Help from another person  bathing (including washing, rinsing, drying)?: A Lot Help from another person to put on and taking off regular upper body clothing?: A Lot Help from another person to put on and taking off regular lower body clothing?: A Little 6 Click Score: 17   End of Session Nurse Communication:  (OT education complete)  Activity Tolerance: Patient tolerated treatment well Patient left: in chair;with call bell/phone within reach;with family/visitor present  OT Visit Diagnosis: Muscle weakness (generalized) (M62.81)                Time: 1103-1130 OT Time Calculation (min): 27 min Charges:  OT General Charges $OT Visit: 1 Visit OT Evaluation $OT Eval Low Complexity: 1 Low OT Treatments $Self Care/Home Management : 8-22 mins  Nellie Chevalier, OTR/L [place]  Office [phone] Pager: [phone]   Lenward Chancellor 04/08/2022, 1:06 PM

## 2022-04-08 NOTE — Discharge Summary (Signed)
Patient ID: Kimberly [name] MRN: [id number] DOB/AGE: [birth year] 81 y.o.  Admit date: 04/07/2022 Discharge date: 04/08/2022  Admission Diagnoses:  Principal Problem:   S/P reverse total shoulder arthroplasty, left   Discharge Diagnoses:  Same  Past Medical History:  Diagnosis Date   Arthritis    Complication of anesthesia    Pt reports that she was told "someone had to breath for her" She is not clear what this means   Difficult intubation    Diverticulitis    GERD (gastroesophageal reflux disease)    History of COVID-19 05/2020   Hypercholesteremia    Hypertension    Mild cognitive impairment    Peripheral neuropathy    Pre-diabetes    Restless leg syndrome     Surgeries: Procedure(s): REVERSE SHOULDER ARTHROPLASTY on 04/07/2022   Consultants: None  Discharged Condition: Improved  Hospital Course: Kimberly [name] is an 81 y.o. female who was admitted 04/07/2022 for operative treatment ofS/P reverse total shoulder arthroplasty, left. Patient has severe unremitting pain that affects sleep, daily activities, and work/hobbies. After pre-op clearance the patient was taken to the operating room on 04/07/2022 and underwent  Procedure(s): [place].    Patient was given perioperative antibiotics:  Anti-infectives (From admission, onward)    Start     Dose/Rate Route Frequency Ordered Stop   04/07/22 1930  vancomycin (VANCOCIN) IVPB 1000 mg/200 mL premix        1,000 mg 200 mL/hr over 60 Minutes Intravenous Every 12 hours 04/07/22 0956 04/07/22 1936   04/07/22 0600  vancomycin (VANCOCIN) IVPB 1000 mg/200 mL premix        1,000 mg 200 mL/hr over 60 Minutes Intravenous On call to O.R. 04/07/22 2119 04/07/22 0815        Patient was given sequential compression devices, early ambulation, and chemoprophylaxis to prevent DVT.  Patient benefited maximally from hospital stay and there were no complications.    Recent vital signs: Patient Vitals for  the past 24 hrs:  BP Temp Temp src Pulse Resp SpO2  04/08/22 0518 (!) 143/76 98.2 F (36.8 C) Oral 94 16 97 %  04/08/22 0115 127/64 98.3 F (36.8 C) Oral 96 17 95 %  04/07/22 2046 (!) 156/78 97.6 F (36.4 C) Oral 92 16 99 %  04/07/22 1639 -- -- -- 97 -- 96 %  04/07/22 1611 (!) 159/73 97.9 F (36.6 C) Oral (!) 101 16 (!) 82 %  04/07/22 1211 128/69 (!) 97.5 F (36.4 C) Oral 95 18 96 %  04/07/22 1000 126/65 97.6 F (36.4 C) Axillary 91 17 90 %  04/07/22 0945 (!) 141/72 97.8 F (36.6 C) -- (!) 101 (!) 24 94 %  04/07/22 0930 (!) 145/32 -- -- (!) 109 17 92 %  04/07/22 0915 (!) 146/77 -- -- (!) 110 (!) 21 96 %  04/07/22 0900 (!) 143/68 98 F (36.7 C) -- (!) 111 14 100 %  04/07/22 0855 (!) 149/108 98 F (36.7 C) -- (!) 110 (!) 22 98 %      Discharge Medications:   Allergies as of 04/08/2022       Reactions   Amoxicillin Swelling   Throat felt swollen Did it involve swelling of the face/tongue/throat, SOB, or low BP? Yes Did it involve sudden or severe rash/hives, skin peeling, or any reaction on the inside of your mouth or nose? No Did you need to seek medical attention at a hospital or doctor's office? No When did it last happen? More than 30  years ago If all above answers are "NO", may proceed with cephalosporin use.   Ciprofloxacin Itching   Adhesive [tape] Itching   Redness   Benzoyl Peroxide Itching   Hydrocodone-acetaminophen Itching   Metronidazole Hives   Oxycodone Other (See Comments)   disoriented   Sulfa Antibiotics Itching, Rash        Medication List     TAKE these medications    acetaminophen 650 MG CR tablet Commonly known as: TYLENOL Take 650 mg by mouth every 8 (eight) hours as needed for pain. What changed: Another medication with the same name was removed. Continue taking this medication, and follow the directions you see here.   amLODipine 2.5 MG tablet Commonly known as: NORVASC Take 2.5 mg by mouth daily.   aspirin EC 81 MG tablet Take 81  mg by mouth daily. Swallow whole.   celecoxib 50 MG capsule Commonly known as: CeleBREX Take 1 capsule (50 mg total) by mouth 2 (two) times daily.   DULoxetine 60 MG capsule Commonly known as: CYMBALTA Take 1 capsule (60 mg total) by mouth daily.   omeprazole 40 MG capsule Commonly known as: PRILOSEC Take 40 mg by mouth in the morning.   pramipexole 0.25 MG tablet Commonly known as: MIRAPEX Take 0.25 mg by mouth at bedtime.   pregabalin 150 MG capsule Commonly known as: Lyrica 1 cap am/1 at noon/ 2 at night   simvastatin 40 MG tablet Commonly known as: ZOCOR Take 40 mg by mouth at bedtime.   SYSTANE OP Place 1 drop into both eyes at bedtime.   tiZANidine 2 MG tablet Commonly known as: ZANAFLEX Take 1 tablet (2 mg total) by mouth every 8 (eight) hours as needed for muscle spasms.   traMADol 50 MG tablet Commonly known as: ULTRAM Take 1 tablet (50 mg total) by mouth every 6 (six) hours as needed for moderate pain.   traZODone 50 MG tablet Commonly known as: DESYREL TAKE 2 TABLETS BY MOUTH AT BEDTIME. What changed: how much to take   vitamin B-12 500 MCG tablet Commonly known as: CYANOCOBALAMIN Take 500 mcg by mouth in the morning.        Diagnostic Studies: DG Chest 2 View  Result Date: 03/29/2022 CLINICAL DATA:  Preoperative evaluation EXAM: CHEST - 2 VIEW COMPARISON:  Chest radiograph 12/09/2015 FINDINGS: Stable cardiac and mediastinal contours. Aortic atherosclerosis. Basilar heterogeneous opacities. No pleural effusion or pneumothorax. Thoracic spine degenerative changes. IMPRESSION: Basilar atelectasis/scarring. Electronically Signed   By: Lovey Newcomer M.D.   On: 03/29/2022 14:28    Disposition: Discharge disposition: 01-Home or Self Care       Discharge Instructions     Call MD / Call 911   Complete by: As directed    If you experience chest pain or shortness of breath, CALL 911 and be transported to the hospital emergency room.  If you develope a  fever above 101 F, pus (white drainage) or increased drainage or redness at the wound, or calf pain, call your surgeon's office.   Constipation Prevention   Complete by: As directed    Drink plenty of fluids.  Prune juice may be helpful.  You may use a stool softener, such as Colace (over the counter) 100 mg twice a day.  Use MiraLax (over the counter) for constipation as needed.   Diet - low sodium heart healthy   Complete by: As directed    Discharge instructions   Complete by: As directed    Discharge Instructions after  Reverse Total Shoulder Arthroplasty   A sling has been provided for you. You are to wear this at all times (except for bathing and dressing), until your first post operative visit with Dr. Tamera Punt. Please also wear while sleeping at night. While you bath and dress, let the arm/elbow extend straight down to stretch your elbow. Wiggle your fingers and pump your first while your in the sling to prevent hand swelling. Use ice on the shoulder intermittently over the first 48 hours after surgery. Continue to use ice or and ice machine as needed after 48 hours for pain control/swelling.  Pain medicine has been prescribed for you.  Use your medicine liberally over the first 48 hours, and then you can begin to taper your use. You may take Extra Strength Tylenol or Tylenol only in place of the pain pills. DO NOT take ANY nonsteroidal anti-inflammatory pain medications: Advil, Motrin, Ibuprofen, Aleve, Naproxen or Naprosyn.  Take one aspirin a day for 2 weeks after surgery, unless you have an aspirin sensitivity/allergy or asthma.  Leave your dressing on until your first follow up visit.  You may shower with the dressing.  Hold your arm as if you still have your sling on while you shower. Simply allow the water to wash over the site and then pat dry. Make sure your axilla (armpit) is completely dry after showering.    Please call [phone] during normal business hours or [phone]  after hours for any problems. Including the following:  - excessive redness of the incisions - drainage for more than 4 days - fever of more than 101.5 F  *Please note that pain medications will not be refilled after hours or on weekends.   Increase activity slowly as tolerated   Complete by: As directed    Post-operative opioid taper instructions:   Complete by: As directed    POST-OPERATIVE OPIOID TAPER INSTRUCTIONS: It is important to wean off of your opioid medication as soon as possible. If you do not need pain medication after your surgery it is ok to stop day one. Opioids include: Codeine, Hydrocodone(Norco, Vicodin), Oxycodone(Percocet, oxycontin) and hydromorphone amongst others.  Long term and even short term use of opiods can cause: Increased pain response Dependence Constipation Depression Respiratory depression And more.  Withdrawal symptoms can include Flu like symptoms Nausea, vomiting And more Techniques to manage these symptoms Hydrate well Eat regular healthy meals Stay active Use relaxation techniques(deep breathing, meditating, yoga) Do Not substitute Alcohol to help with tapering If you have been on opioids for less than two weeks and do not have pain than it is ok to stop all together.  Plan to wean off of opioids This plan should start within one week post op of your joint replacement. Maintain the same interval or time between taking each dose and first decrease the dose.  Cut the total daily intake of opioids by one tablet each day Next start to increase the time between doses. The last dose that should be eliminated is the evening dose.           Follow-up Information     Tania Ade, MD. Go on 04/22/2022.   Specialty: Orthopedic Surgery Why: Your appointment is scheduled for 8:45 Contact information: [place] [address] [phone]                  Signed: Luetta Nutting L. [place],  PA-C 04/08/2022, 8:00 AM

## 2022-04-08 NOTE — Progress Notes (Signed)
PATIENT ID: Kimberly [name]  MRN: [id number]  DOB/AGE:  06-Oct-1940 / 81 y.o.  1 Day Post-Op Procedure(s) (LRB): REVERSE SHOULDER ARTHROPLASTY (Left)  Subjective: Patient reports minimal pain. She feels like the nerve block has worn off "a good bit".  No c/o chest pain or SOB. She reports that she had a good night.    Objective: Vital signs in last 24 hours: Temp:  [97.5 F (36.4 C)-98.3 F (36.8 C)] 98.2 F (36.8 C) (08/11 0518) Pulse Rate:  [91-111] 94 (08/11 0518) Resp:  [14-24] 16 (08/11 0518) BP: (126-159)/(32-108) 143/76 (08/11 0518) SpO2:  [82 %-100 %] 97 % (08/11 0518)  Intake/Output from previous day: 08/10 0701 - 08/11 0700 In: 3764.3 [P.O.:1020; I.V.:2444.3; IV Piggyback:300] Out: 200 [Blood:200]   Physical Exam: Neurologically intact Sensation intact distally Intact pulses distally Incision: dressing C/D/I No cellulitis present Compartment soft Able to move hand, wrist, and elbow with good strength  Assessment/Plan: 1 Day Post-Op Procedure(s) (LRB): REVERSE SHOULDER ARTHROPLASTY (Left)   Advance diet D/C IV fluids when tolerating POs well Non Weight Bearing (NWB) LUE  VTE prophylaxis:  aspirin '81mg'$  daily  Patient will work with OT today. Plan for DC home today with her daughter. Discharge instructions discussed. Follow up with Dr Kimberly [name] in 2 weeks.    Kimberly Skalsky L. [place], PA-C 04/08/2022, 7:51 AM

## 2022-04-22 DIAGNOSIS — Z96612 Presence of left artificial shoulder joint: Secondary | ICD-10-CM | POA: Diagnosis not present

## 2022-04-22 DIAGNOSIS — Z471 Aftercare following joint replacement surgery: Secondary | ICD-10-CM | POA: Diagnosis not present

## 2022-04-29 ENCOUNTER — Ambulatory Visit
Admission: RE | Admit: 2022-04-29 | Discharge: 2022-04-29 | Disposition: A | Discharge status: Home / Self Care | Payer: Medicare Other | Source: Ambulatory Visit | Attending: Podiatry | Admitting: Podiatry

## 2022-04-29 ENCOUNTER — Other Ambulatory Visit: Payer: Medicare Other

## 2022-04-29 DIAGNOSIS — Z981 Arthrodesis status: Secondary | ICD-10-CM | POA: Diagnosis not present

## 2022-04-29 DIAGNOSIS — M19072 Primary osteoarthritis, left ankle and foot: Secondary | ICD-10-CM | POA: Diagnosis not present

## 2022-04-29 DIAGNOSIS — G8929 Other chronic pain: Secondary | ICD-10-CM | POA: Diagnosis not present

## 2022-05-05 DIAGNOSIS — R7303 Prediabetes: Secondary | ICD-10-CM | POA: Diagnosis not present

## 2022-05-05 DIAGNOSIS — E7800 Pure hypercholesterolemia, unspecified: Secondary | ICD-10-CM | POA: Diagnosis not present

## 2022-05-12 DIAGNOSIS — E7800 Pure hypercholesterolemia, unspecified: Secondary | ICD-10-CM | POA: Diagnosis not present

## 2022-05-12 DIAGNOSIS — Z0000 Encounter for general adult medical examination without abnormal findings: Secondary | ICD-10-CM | POA: Diagnosis not present

## 2022-05-12 DIAGNOSIS — G629 Polyneuropathy, unspecified: Secondary | ICD-10-CM | POA: Diagnosis not present

## 2022-05-12 DIAGNOSIS — M79604 Pain in right leg: Secondary | ICD-10-CM | POA: Diagnosis not present

## 2022-05-12 DIAGNOSIS — I10 Essential (primary) hypertension: Secondary | ICD-10-CM | POA: Diagnosis not present

## 2022-05-12 DIAGNOSIS — E538 Deficiency of other specified B group vitamins: Secondary | ICD-10-CM | POA: Diagnosis not present

## 2022-05-12 DIAGNOSIS — K219 Gastro-esophageal reflux disease without esophagitis: Secondary | ICD-10-CM | POA: Diagnosis not present

## 2022-05-12 DIAGNOSIS — M810 Age-related osteoporosis without current pathological fracture: Secondary | ICD-10-CM | POA: Diagnosis not present

## 2022-05-12 DIAGNOSIS — R7303 Prediabetes: Secondary | ICD-10-CM | POA: Diagnosis not present

## 2022-05-17 NOTE — Progress Notes (Signed)
Patient scheduled for 9/21 with Dr Loel Lofty in [place].

## 2022-05-19 ENCOUNTER — Ambulatory Visit (INDEPENDENT_AMBULATORY_CARE_PROVIDER_SITE_OTHER): Payer: Medicare Other | Admitting: Podiatry

## 2022-05-19 DIAGNOSIS — M19072 Primary osteoarthritis, left ankle and foot: Secondary | ICD-10-CM | POA: Diagnosis not present

## 2022-05-19 DIAGNOSIS — G629 Polyneuropathy, unspecified: Secondary | ICD-10-CM | POA: Diagnosis not present

## 2022-05-19 DIAGNOSIS — M25572 Pain in left ankle and joints of left foot: Secondary | ICD-10-CM | POA: Diagnosis not present

## 2022-05-19 NOTE — Progress Notes (Signed)
Subjective:  Patient ID: Kimberly [name], female    DOB: 05/20/1941,  MRN: [id number]  Chief Complaint  Patient presents with   [place] ON LEFT FOOT     81 y.o. female presents with history of chronic severe left ankle pain.  She has been referred from Dr. Jacqualyn [name].  She presents the office today with her daughter.  She has previously seen Dr. Jacqualyn [name] on March 31, 2022.  Was told that she has severe ankle osteoarthritis and that fusion of the ankle joint may be needed however Dr. Jacqualyn [name] does not usually perform this procedure.  She is following up with me to discuss the possibility of surgery including details of the procedure and recovery postop.  She does report she has significant pain when walking in her left ankle.  She has found nothing that really makes this better.  She does take both Celebrex and Lyrica for pain control.  Has a history of peripheral neuropathy as well as the osteoarthritis.  Also history of osteoporosis.  Past Medical History:  Diagnosis Date   Arthritis    Complication of anesthesia    Pt reports that she was told "someone had to breath for her" She is not clear what this means   Difficult intubation    Diverticulitis    GERD (gastroesophageal reflux disease)    History of COVID-19 05/2020   Hypercholesteremia    Hypertension    Mild cognitive impairment    Peripheral neuropathy    Pre-diabetes    Restless leg syndrome     Allergies  Allergen Reactions   Amoxicillin Swelling    Throat felt swollen Did it involve swelling of the face/tongue/throat, SOB, or low BP? Yes Did it involve sudden or severe rash/hives, skin peeling, or any reaction on the inside of your mouth or nose? No Did you need to seek medical attention at a hospital or doctor's office? No When did it last happen? More than 30 years ago If all above answers are "NO", may proceed with cephalosporin use.    Ciprofloxacin Itching   Adhesive [Tape] Itching     Redness   Benzoyl Peroxide Itching   Hydrocodone-Acetaminophen Itching   Metronidazole Hives   Oxycodone Other (See Comments)    disoriented   Sulfa Antibiotics Itching and Rash    ROS: Negative except as per HPI above  Objective:  General: AAO x3, NAD  Dermatological: With inspection and palpation of the right and left lower extremities there are no open sores, no preulcerative lesions, no rash or signs of infection present. Nails are of normal length thickness and coloration.   Vascular:  Dorsalis Pedis artery and Posterior Tibial artery pedal pulses are 2/4 bilateral.  Capillary fill time brisk < 3 sec. Pedal hair growth present. No varicosities and no lower extremity edema present bilateral. There is no pain with calf compression, swelling, warmth, erythema.   Neruologic: Grossly intact via light touch bilateral. Protective threshold slightly diminished distally to the forefoot bilaterally.  patellar and Achilles deep tendon reflexes 2+ bilateral. Negative Babinski reflex.   Musculoskeletal: Edema noted to the left ankle.  Tender to palpation across anterior and anterior lateral aspect of the ankle joint.  There is decreased range of motion with dorsiflexion plantarflexion at the ankle joint.  Muscle strength is 5 out of 5.    Gait: Unassisted, antalgic  No images are attached to the encounter.  Radiographs:  CT Left ankle 04/29/22 1. Remote healed distal fibular fractures.  2. Partially fused tibiotalar joint. 3. Small osseous densities off the distal tips of the lateral malleolus and medial malleolus which could be due to remote avulsion fractures. 4. Moderate subtalar joint degenerative changes but no effusion. 5. Mild to moderate midfoot degenerative changes mainly at the tarsometatarsal joints. Assessment:   1. Arthritis of ankle, left   2. Pain in left ankle and joints of left foot      [name]:  Patient was evaluated and treated and all questions answered.  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  I do not think steroid injections or chronic anti-inflammatory medications will be effective to reduce her pain. -Long-term ankle bracing is an option for her however she states she is not able to tolerate ankle brace as it is painful for her. -I discussed that I do recommend surgical intervention to include ankle joint arthrodesis with plate and screw fixation construct.  I explained in detail how the procedure would be performed as well as the expected postoperative course.  I did explain that she would need to have a extended period of time being nonweightbearing to the left lower extremity following the procedure likely around 4 to 6 weeks at a minimal.  The patient does have a history of neuropathy and osteoporosis which would increase the risk of complication related to the surgery.  There are potential risks including infection numbness nonunion of the bone and need for further surgery. -Discussed that the patient would likely need to have the surgery done at [facility] hospital and would likely need to go to a rehab facility following the procedure for some length of time. -After full discussion of the planned procedure including left ankle arthrodesis as well as the postoperative course, need to have the procedure done at [facility], and potential risks associated with the procedure the patient would like additional time to consider surgical versus ongoing conservative treatment. -I provided the patient a card for our surgery scheduler and she will call if she wants to proceed with surgical intervention.  We will have to have the patient back for preoperative consultation to go over the full consent form at that time as the patient was not sure if she wants to proceed currently. -In the meantime  follow-up continue with medication and ankle brace for pain control         Everitt Amber, DPM [facility]

## 2022-05-20 NOTE — Addendum Note (Signed)
Addended by: Ames Coupe F on: 05/20/2022 02:34 PM   Modules accepted: Level of Service

## 2022-06-07 ENCOUNTER — Ambulatory Visit: Payer: Medicare Other | Admitting: Neurology

## 2022-06-13 DIAGNOSIS — M5416 Radiculopathy, lumbar region: Secondary | ICD-10-CM | POA: Diagnosis not present

## 2022-06-13 DIAGNOSIS — M545 Low back pain, unspecified: Secondary | ICD-10-CM | POA: Diagnosis not present

## 2022-06-14 ENCOUNTER — Other Ambulatory Visit: Payer: Self-pay | Admitting: Student in an Organized Health Care Education/Training Program

## 2022-06-14 DIAGNOSIS — M5416 Radiculopathy, lumbar region: Secondary | ICD-10-CM

## 2022-06-18 ENCOUNTER — Ambulatory Visit
Admission: RE | Admit: 2022-06-18 | Discharge: 2022-06-18 | Disposition: A | Discharge status: Home / Self Care | Payer: Medicare Other | Source: Ambulatory Visit | Attending: Student in an Organized Health Care Education/Training Program | Admitting: Student in an Organized Health Care Education/Training Program

## 2022-06-18 DIAGNOSIS — M47816 Spondylosis without myelopathy or radiculopathy, lumbar region: Secondary | ICD-10-CM | POA: Diagnosis not present

## 2022-06-18 DIAGNOSIS — M545 Low back pain, unspecified: Secondary | ICD-10-CM | POA: Diagnosis not present

## 2022-06-18 DIAGNOSIS — M48061 Spinal stenosis, lumbar region without neurogenic claudication: Secondary | ICD-10-CM | POA: Diagnosis not present

## 2022-06-18 DIAGNOSIS — M79604 Pain in right leg: Secondary | ICD-10-CM | POA: Diagnosis not present

## 2022-06-18 DIAGNOSIS — M5136 Other intervertebral disc degeneration, lumbar region: Secondary | ICD-10-CM | POA: Diagnosis not present

## 2022-06-18 DIAGNOSIS — M5416 Radiculopathy, lumbar region: Secondary | ICD-10-CM

## 2022-06-18 DIAGNOSIS — M4316 Spondylolisthesis, lumbar region: Secondary | ICD-10-CM | POA: Diagnosis not present

## 2022-06-22 DIAGNOSIS — M5116 Intervertebral disc disorders with radiculopathy, lumbar region: Secondary | ICD-10-CM | POA: Diagnosis not present

## 2022-06-29 DIAGNOSIS — M19012 Primary osteoarthritis, left shoulder: Secondary | ICD-10-CM | POA: Diagnosis not present

## 2022-06-29 DIAGNOSIS — Z9889 Other specified postprocedural states: Secondary | ICD-10-CM | POA: Diagnosis not present

## 2022-07-05 ENCOUNTER — Ambulatory Visit: Payer: Medicare Other | Admitting: Neurology

## 2022-07-08 ENCOUNTER — Other Ambulatory Visit: Payer: Self-pay | Admitting: Orthopedic Surgery

## 2022-07-08 DIAGNOSIS — M545 Low back pain, unspecified: Secondary | ICD-10-CM

## 2022-07-08 DIAGNOSIS — M5416 Radiculopathy, lumbar region: Secondary | ICD-10-CM | POA: Diagnosis not present

## 2022-07-15 ENCOUNTER — Ambulatory Visit
Admission: RE | Admit: 2022-07-15 | Discharge: 2022-07-15 | Disposition: A | Discharge status: Home / Self Care | Payer: Medicare Other | Source: Ambulatory Visit | Attending: Orthopedic Surgery | Admitting: Orthopedic Surgery

## 2022-07-15 DIAGNOSIS — G8929 Other chronic pain: Secondary | ICD-10-CM

## 2022-07-15 DIAGNOSIS — M4727 Other spondylosis with radiculopathy, lumbosacral region: Secondary | ICD-10-CM | POA: Diagnosis not present

## 2022-07-15 DIAGNOSIS — M48061 Spinal stenosis, lumbar region without neurogenic claudication: Secondary | ICD-10-CM | POA: Diagnosis not present

## 2022-07-15 MED ORDER — METHYLPREDNISOLONE ACETATE 40 MG/ML INJ SUSP (RADIOLOG: 80.0000 mg | Freq: Once | INTRAMUSCULAR | Status: DC

## 2022-07-15 MED ORDER — IOPAMIDOL (ISOVUE-M 200) INJECTION 41%: 1.0000 mL | Freq: Once | INTRAMUSCULAR | Status: DC

## 2022-07-15 NOTE — Discharge Instructions (Signed)

## 2022-07-29 DIAGNOSIS — M48062 Spinal stenosis, lumbar region with neurogenic claudication: Secondary | ICD-10-CM | POA: Diagnosis not present

## 2022-08-25 DIAGNOSIS — Z03818 Encounter for observation for suspected exposure to other biological agents ruled out: Secondary | ICD-10-CM | POA: Diagnosis not present

## 2022-08-25 DIAGNOSIS — B349 Viral infection, unspecified: Secondary | ICD-10-CM | POA: Diagnosis not present

## 2022-08-31 DIAGNOSIS — R0981 Nasal congestion: Secondary | ICD-10-CM | POA: Diagnosis not present

## 2022-08-31 DIAGNOSIS — J069 Acute upper respiratory infection, unspecified: Secondary | ICD-10-CM | POA: Diagnosis not present

## 2022-08-31 DIAGNOSIS — J329 Chronic sinusitis, unspecified: Secondary | ICD-10-CM | POA: Diagnosis not present

## 2022-08-31 DIAGNOSIS — R059 Cough, unspecified: Secondary | ICD-10-CM | POA: Diagnosis not present

## 2022-09-26 DIAGNOSIS — M19012 Primary osteoarthritis, left shoulder: Secondary | ICD-10-CM | POA: Diagnosis not present

## 2022-10-12 DIAGNOSIS — M4692 Unspecified inflammatory spondylopathy, cervical region: Secondary | ICD-10-CM | POA: Diagnosis not present

## 2022-10-12 DIAGNOSIS — M67911 Unspecified disorder of synovium and tendon, right shoulder: Secondary | ICD-10-CM | POA: Diagnosis not present

## 2022-10-20 ENCOUNTER — Other Ambulatory Visit: Payer: Self-pay | Admitting: Orthopedic Surgery

## 2022-10-20 ENCOUNTER — Encounter: Payer: Self-pay | Admitting: Orthopedic Surgery

## 2022-10-20 DIAGNOSIS — M545 Low back pain, unspecified: Secondary | ICD-10-CM

## 2022-10-27 ENCOUNTER — Ambulatory Visit
Admission: RE | Admit: 2022-10-27 | Discharge: 2022-10-27 | Disposition: A | Discharge status: Home / Self Care | Payer: Medicare Other | Source: Ambulatory Visit | Attending: Orthopedic Surgery | Admitting: Orthopedic Surgery

## 2022-10-27 DIAGNOSIS — M545 Low back pain, unspecified: Secondary | ICD-10-CM

## 2022-10-27 DIAGNOSIS — M48061 Spinal stenosis, lumbar region without neurogenic claudication: Secondary | ICD-10-CM | POA: Diagnosis not present

## 2022-10-27 DIAGNOSIS — M4727 Other spondylosis with radiculopathy, lumbosacral region: Secondary | ICD-10-CM | POA: Diagnosis not present

## 2022-10-27 MED ORDER — METHYLPREDNISOLONE ACETATE 40 MG/ML INJ SUSP (RADIOLOG
80.0000 mg | Freq: Once | INTRAMUSCULAR | Status: AC
  Administered 2022-10-27: 80 mg via EPIDURAL

## 2022-10-27 MED ORDER — IOPAMIDOL (ISOVUE-M 200) INJECTION 41%
1.0000 mL | Freq: Once | INTRAMUSCULAR | Status: AC
  Administered 2022-10-27: 1 mL via EPIDURAL

## 2022-10-27 NOTE — Discharge Instructions (Signed)

## 2022-11-10 DIAGNOSIS — R7303 Prediabetes: Secondary | ICD-10-CM | POA: Diagnosis not present

## 2022-11-10 DIAGNOSIS — E7800 Pure hypercholesterolemia, unspecified: Secondary | ICD-10-CM | POA: Diagnosis not present

## 2022-11-10 LAB — LIPID PANEL
Cholesterol: 176 (ref 0–200)
HDL: 55 (ref 35–70)
LDL Cholesterol: 100
LDl/HDL Ratio: 1.8
Triglycerides: 107 (ref 40–160)

## 2022-11-10 LAB — BASIC METABOLIC PANEL
BUN: 18 (ref 4–21)
CO2: 30 — AB (ref 13–22)
Chloride: 105 (ref 99–108)
Creatinine: 0.8 (ref 0.5–1.1)
Glucose: 96
Potassium: 4.6 mEq/L (ref 3.5–5.1)
Sodium: 143 (ref 137–147)

## 2022-11-10 LAB — HEMOGLOBIN A1C: Hemoglobin A1C: 5.8

## 2022-11-10 LAB — COMPREHENSIVE METABOLIC PANEL
Albumin: 3.7 (ref 3.5–5.0)
Calcium: 9.4 (ref 8.7–10.7)

## 2022-11-10 LAB — HEPATIC FUNCTION PANEL
ALT: 20 U/L (ref 7–35)
AST: 16 (ref 13–35)

## 2022-11-11 ENCOUNTER — Other Ambulatory Visit: Payer: Self-pay | Admitting: Neurology

## 2022-11-16 NOTE — Telephone Encounter (Signed)
I was trying to refill her cymbalta when it triggered some interactions. I wanted to run it by the provider for approval.  High Drug-Drug: traMADol and DULoxetineDuloxetine may enhance the adverse/toxic effect of tramadol. The risk for serotonin syndrome/serotonin toxicity and seizures may be increased with this combination. Duloxetine may diminish the therapeutic effect of tramadol. Details Override reason     DULoxetine (CYMBALTA) 60 MG capsule [Pharmacy Med Name: DULOXETINE HCL DR 60 MG CAP] Prescription. Reordered. Long-term. traMADol (ULTRAM) 50 MG tabletPrescription. Active. Discontinue High Drug-Drug: traZODone and DULoxetineSerotonergic effects of trazodone and serotonin/norepinephrine reuptake inhibitors (SNRIs) may be additive. The risk of serotonin syndrome/toxicity may be increased. Details Override reason     DULoxetine (CYMBALTA) 60 MG capsule [Pharmacy Med Name: DULOXETINE HCL DR 60 MG CAP] Prescription. Reordered. Long-term. traZODone (DESYREL) 50 MG tabletPrescription. Active. Long-term.

## 2022-11-18 DIAGNOSIS — H04123 Dry eye syndrome of bilateral lacrimal glands: Secondary | ICD-10-CM | POA: Diagnosis not present

## 2022-11-18 DIAGNOSIS — Z961 Presence of intraocular lens: Secondary | ICD-10-CM | POA: Diagnosis not present

## 2022-11-18 DIAGNOSIS — H02831 Dermatochalasis of right upper eyelid: Secondary | ICD-10-CM | POA: Diagnosis not present

## 2022-11-18 DIAGNOSIS — H02834 Dermatochalasis of left upper eyelid: Secondary | ICD-10-CM | POA: Diagnosis not present

## 2022-11-23 DIAGNOSIS — M19011 Primary osteoarthritis, right shoulder: Secondary | ICD-10-CM | POA: Diagnosis not present

## 2022-12-05 DIAGNOSIS — M5416 Radiculopathy, lumbar region: Secondary | ICD-10-CM | POA: Diagnosis not present

## 2022-12-06 DIAGNOSIS — M159 Polyosteoarthritis, unspecified: Secondary | ICD-10-CM | POA: Diagnosis not present

## 2022-12-06 DIAGNOSIS — K219 Gastro-esophageal reflux disease without esophagitis: Secondary | ICD-10-CM | POA: Diagnosis not present

## 2022-12-06 DIAGNOSIS — I10 Essential (primary) hypertension: Secondary | ICD-10-CM | POA: Diagnosis not present

## 2022-12-06 DIAGNOSIS — M549 Dorsalgia, unspecified: Secondary | ICD-10-CM | POA: Diagnosis not present

## 2022-12-06 DIAGNOSIS — E538 Deficiency of other specified B group vitamins: Secondary | ICD-10-CM | POA: Diagnosis not present

## 2022-12-06 DIAGNOSIS — E7800 Pure hypercholesterolemia, unspecified: Secondary | ICD-10-CM | POA: Diagnosis not present

## 2022-12-06 DIAGNOSIS — E559 Vitamin D deficiency, unspecified: Secondary | ICD-10-CM | POA: Diagnosis not present

## 2022-12-06 DIAGNOSIS — R7303 Prediabetes: Secondary | ICD-10-CM | POA: Diagnosis not present

## 2022-12-06 DIAGNOSIS — F5101 Primary insomnia: Secondary | ICD-10-CM | POA: Diagnosis not present

## 2022-12-19 ENCOUNTER — Other Ambulatory Visit: Payer: Self-pay | Admitting: Orthopedic Surgery

## 2022-12-28 ENCOUNTER — Encounter: Payer: Self-pay | Admitting: Internal Medicine

## 2022-12-28 ENCOUNTER — Ambulatory Visit (INDEPENDENT_AMBULATORY_CARE_PROVIDER_SITE_OTHER): Payer: Medicare Other | Admitting: Internal Medicine

## 2022-12-28 VITALS — BP 130/80 | HR 82 | Temp 98.2°F | Ht 62.0 in | Wt 169.6 lb

## 2022-12-28 DIAGNOSIS — R413 Other amnesia: Secondary | ICD-10-CM

## 2022-12-28 DIAGNOSIS — G2581 Restless legs syndrome: Secondary | ICD-10-CM | POA: Diagnosis not present

## 2022-12-28 DIAGNOSIS — E785 Hyperlipidemia, unspecified: Secondary | ICD-10-CM | POA: Insufficient documentation

## 2022-12-28 DIAGNOSIS — E782 Mixed hyperlipidemia: Secondary | ICD-10-CM

## 2022-12-28 DIAGNOSIS — I10 Essential (primary) hypertension: Secondary | ICD-10-CM | POA: Diagnosis not present

## 2022-12-28 DIAGNOSIS — E538 Deficiency of other specified B group vitamins: Secondary | ICD-10-CM | POA: Diagnosis not present

## 2022-12-28 DIAGNOSIS — G3184 Mild cognitive impairment, so stated: Secondary | ICD-10-CM

## 2022-12-28 NOTE — Assessment & Plan Note (Signed)
On pramipexole. Followed by neurology.

## 2022-12-28 NOTE — Progress Notes (Signed)
New Patient Office Visit     CC/Reason for Visit: Establish care, discuss chronic conditions  Previous PCP: Fatima Sanger, NP Last Visit: Recently  HPI: Kimberly [name] is a 82 y.o. female who is coming in today for the above mentioned reasons. Past Medical History is significant for: HTN, HLD, mild cognitive impairment, B12 deficiency, RLS. She sees Dr Terrace Arabia with neurology. Will be having back surgery this month for a herniated lumbar disc that is causing significant pain. Daughter expresses concerns about worsening memory loss.   Past Medical/Surgical History: Past Medical History:  Diagnosis Date   Arthritis    Complication of anesthesia    Pt reports that she was told "someone had to breath for her" She is not clear what this means   Difficult intubation    Diverticulitis    GERD (gastroesophageal reflux disease)    History of COVID-19 05/2020   Hypercholesteremia    Hypertension    Mild cognitive impairment    Peripheral neuropathy    Pre-diabetes    Restless leg syndrome     Past Surgical History:  Procedure Laterality Date   ABDOMINAL HYSTERECTOMY     Age 25   BACK SURGERY     BREAST BIOPSY Left    COLONOSCOPY WITH PROPOFOL N/A 11/13/2020   Procedure: COLONOSCOPY WITH PROPOFOL;  Surgeon: Jeani Hawking, MD;  Location: [facility];  Service: Endoscopy;  Laterality: N/A;   ESOPHAGOGASTRODUODENOSCOPY (EGD) WITH PROPOFOL N/A 11/13/2020   Procedure: ESOPHAGOGASTRODUODENOSCOPY (EGD) WITH PROPOFOL;  Surgeon: Jeani Hawking, MD;  Location: [facility];  Service: Endoscopy;  Laterality: N/A;   EYE SURGERY     bilateral cateract    POLYPECTOMY  11/13/2020   Procedure: POLYPECTOMY;  Surgeon: Jeani Hawking, MD;  Location: [facility];  Service: Endoscopy;;   REVERSE SHOULDER ARTHROPLASTY Left 04/07/2022   Procedure: REVERSE SHOULDER ARTHROPLASTY;  Surgeon: Jones Broom, MD;  Location: [facility];  Service: Orthopedics;  Laterality: Left;   TONSILLECTOMY      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   Procedure: TOTAL KNEE ARTHROPLASTY;  Surgeon: Durene Romans, MD;  Location: [facility];  Service: Orthopedics;  Laterality: Left;  70 mins    Social History:  reports that she has never smoked. She has never used smokeless tobacco. She reports that she does not drink alcohol and does not use drugs.  Allergies: Allergies  Allergen Reactions   Amoxicillin Swelling    Throat felt swollen Did it involve swelling of the face/tongue/throat, SOB, or low BP? Yes Did it involve sudden or severe rash/hives, skin peeling, or any reaction on the inside of your mouth or nose? No Did you need to seek medical attention at a hospital or doctor's office? No When did it last happen? More than 30 years ago If all above answers are "NO", may proceed with cephalosporin use.    Ciprofloxacin Itching   Adhesive [Tape] Itching    Redness   Benzoyl Peroxide Itching   Hydrocodone-Acetaminophen Itching   Metronidazole Hives   Oxycodone Other (See Comments)    disoriented   Sulfa Antibiotics Itching and Rash    Family History:  Family History  Problem Relation Age of Onset   Breast cancer Sister    Other Mother        passed away at age 42   Heart attack Father        passed away at age 42     Current Outpatient Medications:    acetaminophen (TYLENOL) 650 MG CR tablet,  Take 650 mg by mouth every 8 (eight) hours as needed for pain., Disp: , Rfl:    amLODipine (NORVASC) 2.5 MG tablet, Take 2.5 mg by mouth daily., Disp: , Rfl:    aspirin EC 81 MG tablet, Take 81 mg by mouth daily. Swallow whole., Disp: , Rfl:    celecoxib (CELEBREX) 50 MG capsule, Take 1 capsule (50 mg total) by mouth 2 (two) times daily., Disp: 60 capsule, Rfl: 6   DULoxetine (CYMBALTA) 60 MG capsule, TAKE 1 CAPSULE BY MOUTH EVERY DAY, Disp: 90 capsule, Rfl: 3   omeprazole (PRILOSEC) 40 MG capsule, Take 40 mg by mouth in the morning., Disp: , Rfl:    Polyethyl Glycol-Propyl Glycol (SYSTANE OP),  Place 1 drop into both eyes at bedtime., Disp: , Rfl:    pramipexole (MIRAPEX) 0.25 MG tablet, Take 0.25 mg by mouth at bedtime., Disp: , Rfl:    pregabalin (LYRICA) 150 MG capsule, 1 cap am/1 at noon/ 2 at night, Disp: 120 capsule, Rfl: 5   simvastatin (ZOCOR) 40 MG tablet, Take 40 mg by mouth at bedtime. , Disp: , Rfl:    tiZANidine (ZANAFLEX) 2 MG tablet, Take 1 tablet (2 mg total) by mouth every 8 (eight) hours as needed for muscle spasms., Disp: 30 tablet, Rfl: 0   traMADol (ULTRAM) 50 MG tablet, Take 1 tablet (50 mg total) by mouth every 6 (six) hours as needed for moderate pain., Disp: 30 tablet, Rfl: 0   traZODone (DESYREL) 50 MG tablet, TAKE 2 TABLETS BY MOUTH AT BEDTIME. (Patient taking differently: Take 50 mg by mouth at bedtime.), Disp: 180 tablet, Rfl: 4   vitamin B-12 (CYANOCOBALAMIN) 500 MCG tablet, Take 500 mcg by mouth in the morning., Disp: , Rfl:   Review of Systems:  Negative except as indicated in HPI.   Physical Exam: Vitals:   12/28/22 1503  BP: 130/80  Pulse: 82  Temp: 98.2 F (36.8 C)  TempSrc: Oral  SpO2: 95%  Weight: 169 lb 9.6 oz (76.9 kg)  Height: 5\' 2"  (1.575 m)   Body mass index is 31.02 kg/m.  Physical Exam Vitals reviewed.  Constitutional:      Appearance: Normal appearance.  HENT:     Head: Normocephalic and atraumatic.  Eyes:     Conjunctiva/sclera: Conjunctivae normal.     Pupils: Pupils are equal, round, and reactive to light.  Cardiovascular:     Rate and Rhythm: Normal rate and regular rhythm.  Pulmonary:     Effort: Pulmonary effort is normal.     Breath sounds: Normal breath sounds.  Skin:    General: Skin is warm and dry.  Neurological:     General: No focal deficit present.     Mental Status: She is alert and oriented to person, place, and time.  Psychiatric:        Mood and Affect: Mood normal.        Behavior: Behavior normal.        Thought Content: Thought content normal.        Judgment: Judgment normal.        Impression and Plan:  Primary hypertension Assessment & Plan: Well controlled on amlodipine 2.5 mg daily.   Mixed hyperlipidemia Assessment & Plan: On simvastatin 40 mg daily. Obtain recent labs.   Memory loss -     Ambulatory referral to Neuropsychology  Restless leg syndrome Assessment & Plan: On pramipexole. Followed by neurology.   B12 deficiency Assessment & Plan: Obtain recent labs to see if  B12 was done and normal. This could be impairing her memory function.   Mild cognitive impairment Assessment & Plan: Daughter states memory loss is worsening. She already sees [facility]. Will place a referral to neuropsych there as well.     Time spent: 47 minutes reviewing chart, interviewing and examining patient, discussing with daughter and formulating plan of care.    Chaya Jan, MD [facility]

## 2022-12-28 NOTE — Assessment & Plan Note (Signed)
Obtain recent labs to see if B12 was done and normal. This could be impairing her memory function.

## 2022-12-28 NOTE — Assessment & Plan Note (Signed)
Daughter states memory loss is worsening. She already sees [facility]. Will place a referral to neuropsych there as well.

## 2022-12-28 NOTE — Assessment & Plan Note (Signed)
On simvastatin 40 mg daily. Obtain recent labs.

## 2022-12-28 NOTE — Assessment & Plan Note (Signed)
Well-controlled on amlodipine 2.5 mg daily. 

## 2023-01-04 NOTE — Pre-Procedure Instructions (Signed)
Surgical Instructions    Your procedure is scheduled on Jan 12, 2023.  Report to [facility] Main Entrance "A" at 8:00 A.M., then check in with the Admitting office.  Call this number if you have problems the morning of surgery:  [phone]  If you have any questions prior to your surgery date call [phone]: Open Monday-Friday 8am-4pm If you experience any cold or flu symptoms such as cough, fever, chills, shortness of breath, etc. between now and your scheduled surgery, please notify us at the above number.     Remember:  Do not eat after midnight the night before your surgery  You may drink clear liquids until 8:00 AM the morning of your surgery.   Clear liquids allowed are: Water, Non-Citrus Juices (without pulp), Carbonated Beverages, Clear Tea, Black Coffee Only (NO MILK, CREAM OR POWDERED CREAMER of any kind), and Gatorade.  Patient Instructions  The night before surgery:  No food after midnight. ONLY clear liquids after midnight  The day of surgery (if you do NOT have diabetes):  Drink ONE (1) Pre-Surgery Clear Ensure by 8:00 AM the morning of surgery. Drink in one sitting. Do not sip.  This drink was given to you during your hospital  pre-op appointment visit.  Nothing else to drink after completing the  Pre-Surgery Clear Ensure.         If you have questions, please contact your surgeon's office.      Take these medicines the morning of surgery with A SIP OF WATER:  amLODipine (NORVASC)   DULoxetine (CYMBALTA)   gabapentin (NEURONTIN)   omeprazole (PRILOSEC)    May take these medicines IF NEEDED:  acetaminophen (TYLENOL)   Polyethyl Glycol-Propyl Glycol (SYSTANE OP) eye drops  traMADol (ULTRAM)    As of today, STOP taking any Aspirin (unless otherwise instructed by your surgeon) Aleve, Naproxen, Ibuprofen, Motrin, Advil, Goody's, BC's, all herbal medications, fish oil, and all vitamins. This includes your medication: meloxicam (MOBIC)                       Do NOT Smoke (Tobacco/Vaping) for 24 hours prior to your procedure.  If you use a CPAP at night, you may bring your mask/headgear for your overnight stay.   Contacts, glasses, piercing's, hearing aid's, dentures or partials may not be worn into surgery, please bring cases for these belongings.    For patients admitted to the hospital, discharge time will be determined by your treatment team.   Patients discharged the day of surgery will not be allowed to drive home, and someone needs to stay with them for 24 hours.  SURGICAL WAITING ROOM VISITATION Patients having surgery or a procedure may have no more than 2 support people in the waiting area - these visitors may rotate.   Children under the age of 90 must have an adult with them who is not the patient. If the patient needs to stay at the hospital during part of their recovery, the visitor guidelines for inpatient rooms apply. Pre-op nurse will coordinate an appropriate time for 1 support person to accompany patient in pre-op.  This support person may not rotate.   Please refer to the [facility] website for the visitor guidelines for Inpatients (after your surgery is over and you are in a regular room).    Special instructions:   [facility]- Preparing For Surgery  Before surgery, you can play an important role. Because skin is not sterile, your skin needs to be as  free of germs as possible. You can reduce the number of germs on your skin by washing with CHG (chlorahexidine gluconate) Soap before surgery.  CHG is an antiseptic cleaner which kills germs and bonds with the skin to continue killing germs even after washing.    Oral Hygiene is also important to reduce your risk of infection.  Remember - BRUSH YOUR TEETH THE MORNING OF SURGERY WITH YOUR REGULAR TOOTHPASTE  Please follow the instructions on the handout you received at your pre-admission appointment about preparing for your upcoming surgery using the CHG surgical soap.     Day of Surgery: Take a shower with CHG soap. Do not wear jewelry or makeup Do not wear lotions, powders, perfumes/colognes, or deodorant. Do not shave 48 hours prior to surgery.  Men may shave face and neck. Do not bring valuables to the hospital.  [facility] is not responsible for any belongings or valuables. Do not wear nail polish, gel polish, artificial nails, or any other type of covering on natural nails (fingers and toes) If you have artificial nails or gel coating that need to be removed by a nail salon, please have this removed prior to surgery. Artificial nails or gel coating may interfere with anesthesia's ability to adequately monitor your vital signs. Wear Clean/Comfortable clothing the morning of surgery Remember to brush your teeth WITH YOUR REGULAR TOOTHPASTE.   Please read over the following fact sheets that you were given.    If you received a COVID test during your pre-op visit  it is requested that you wear a mask when out in public, stay away from anyone that may not be feeling well and notify your surgeon if you develop symptoms. If you have been in contact with anyone that has tested positive in the last 10 days please notify you surgeon.

## 2023-01-05 ENCOUNTER — Other Ambulatory Visit: Payer: Self-pay

## 2023-01-05 ENCOUNTER — Encounter (HOSPITAL_COMMUNITY): Payer: Self-pay

## 2023-01-05 ENCOUNTER — Other Ambulatory Visit (HOSPITAL_COMMUNITY): Payer: Medicare Other

## 2023-01-05 ENCOUNTER — Encounter (HOSPITAL_COMMUNITY)
Admission: RE | Admit: 2023-01-05 | Discharge: 2023-01-05 | Disposition: A | Discharge status: Home / Self Care | Payer: Medicare Other | Source: Ambulatory Visit | Attending: Orthopedic Surgery | Admitting: Orthopedic Surgery

## 2023-01-05 VITALS — BP 133/77 | HR 86 | Temp 98.5°F | Resp 18 | Ht 62.0 in | Wt 167.6 lb

## 2023-01-05 DIAGNOSIS — R9431 Abnormal electrocardiogram [ECG] [EKG]: Secondary | ICD-10-CM | POA: Diagnosis not present

## 2023-01-05 DIAGNOSIS — Z01818 Encounter for other preprocedural examination: Secondary | ICD-10-CM | POA: Diagnosis not present

## 2023-01-05 DIAGNOSIS — I2510 Atherosclerotic heart disease of native coronary artery without angina pectoris: Secondary | ICD-10-CM | POA: Diagnosis not present

## 2023-01-05 LAB — TYPE AND SCREEN
ABO/RH(D): A POS
Antibody Screen: NEGATIVE

## 2023-01-05 LAB — BASIC METABOLIC PANEL
Anion gap: 6 (ref 5–15)
BUN: 14 mg/dL (ref 8–23)
CO2: 28 mmol/L (ref 22–32)
Calcium: 9.4 mg/dL (ref 8.9–10.3)
Chloride: 101 mmol/L (ref 98–111)
Creatinine, Ser: 0.78 mg/dL (ref 0.44–1.00)
GFR, Estimated: >60 mL/min (ref >60)
Glucose, Bld: 96 mg/dL (ref 70–99)
Potassium: 4.1 mmol/L (ref 3.5–5.1)
Sodium: 135 mmol/L (ref 135–145)

## 2023-01-05 LAB — CBC
HCT: 39.1 % (ref 36.0–46.0)
Hemoglobin: 12.9 g/dL (ref 12.0–15.0)
MCH: 30.4 pg (ref 26.0–34.0)
MCHC: 33 g/dL (ref 30.0–36.0)
MCV: 92.2 fL (ref 80.0–100.0)
Platelets: 348 10*3/uL (ref 150–400)
RBC: 4.24 MIL/uL (ref 3.87–5.11)
RDW: 13.2 % (ref 11.5–15.5)
WBC: 7.5 10*3/uL (ref 4.0–10.5)
nRBC: 0 % (ref 0.0–0.2)

## 2023-01-05 LAB — SURGICAL PCR SCREEN
MRSA, PCR: NEGATIVE
Staphylococcus aureus: NEGATIVE

## 2023-01-05 NOTE — Progress Notes (Signed)
PCP - Dr. Keith Rake Cardiologist - Denies  PPM/ICD - Denies Device Orders - n/a Rep Notified - n/a  Chest x-ray - 03/29/2022 EKG - 01/05/2023 Stress Test - Denies ECHO - Denies Cardiac Cath - Denies  Sleep Study - Denies CPAP - n/a  No DM  Last dose of GLP1 agonist- n/a GLP1 instructions: n/a  Blood Thinner Instructions: n/a Aspirin Instructions: Pt last dose of ASA was May 2nd and she will continue to hold until after surgery  ERAS Protcol - Clear liquids until 0800 morning of surgery PRE-SURGERY Ensure or G2- Ensure given to patient with instructions  COVID TEST- n/a   Anesthesia review: Yes. Abnormal EKG and Difficult Intubation flag. Pt brought note from Anesthesiologist addressing the difficult intubation from previous surgery. Note placed in chart.   Patient denies shortness of breath, fever, cough and chest pain at PAT appointment. Pt denies any respiratory illness/infection in the last two months.   All instructions explained to the patient, with a verbal understanding of the material. Patient agrees to go over the instructions while at home for a better understanding. Patient also instructed to self quarantine after being tested for COVID-19. The opportunity to ask questions was provided.

## 2023-01-06 NOTE — Anesthesia Preprocedure Evaluation (Signed)
Anesthesia Evaluation  Patient identified by MRN, date of birth, ID band Patient awake    Reviewed: Allergy & Precautions, NPO status , Patient's Chart, lab work & pertinent test results  History of Anesthesia Complications (+) DIFFICULT AIRWAY and history of anesthetic complications  Airway Mallampati: IV  TM Distance: >3 FB Neck ROM: Full  Mouth opening: Limited Mouth Opening  Dental no notable dental hx.    Pulmonary neg pulmonary ROS   Pulmonary exam normal breath sounds clear to auscultation       Cardiovascular hypertension, Pt. on medications Normal cardiovascular exam Rhythm:Regular Rate:Normal     Neuro/Psych  Neuromuscular disease  negative psych ROS   GI/Hepatic Neg liver ROS,GERD  Medicated,,  Endo/Other    Renal/GU negative Renal ROSLab Results      Component                Value               Date                      CREATININE               0.78                01/05/2023                BUN                      14                  01/05/2023                NA                       135                 01/05/2023                K                        4.1                 01/05/2023                CL                       101                 01/05/2023                CO2                      28                  01/05/2023             negative genitourinary   Musculoskeletal  (+) Arthritis , Osteoarthritis,    Abdominal   Peds negative pediatric ROS (+)  Hematology negative hematology ROS (+) Lab Results      Component                Value               Date  WBC                      7.5                 01/05/2023                HGB                      12.9                01/05/2023                HCT                      39.1                01/05/2023                MCV                      92.2                01/05/2023                PLT                      348                  01/05/2023              Anesthesia Other Findings All: see list  Reproductive/Obstetrics negative OB ROS                             Anesthesia Physical Anesthesia Plan  ASA: 2  Anesthesia Plan: General   Post-op Pain Management: Ketamine IV* and Ofirmev IV (intra-op)*   Induction: Intravenous  PONV Risk Score and Plan: 3 and Treatment may vary due to age or medical condition and Ondansetron  Airway Management Planned: Oral ETT and Video Laryngoscope Planned  Additional Equipment: None  Intra-op Plan:   Post-operative Plan: Extubation in OR  Informed Consent: I have reviewed the patients History and Physical, chart, labs and discussed the procedure including the risks, benefits and alternatives for the proposed anesthesia with the patient or authorized representative who has indicated his/her understanding and acceptance.     Dental advisory given  Plan Discussed with: CRNA and Anesthesiologist  Anesthesia Plan Comments: (History of difficult intubation. Elective glidescope used for intubation 11/05/21 without complication. )        Anesthesia Quick Evaluation

## 2023-01-12 ENCOUNTER — Other Ambulatory Visit: Payer: Self-pay

## 2023-01-12 ENCOUNTER — Ambulatory Visit (HOSPITAL_BASED_OUTPATIENT_CLINIC_OR_DEPARTMENT_OTHER): Payer: Medicare Other | Admitting: Anesthesiology

## 2023-01-12 ENCOUNTER — Ambulatory Visit (HOSPITAL_COMMUNITY): Payer: Medicare Other

## 2023-01-12 ENCOUNTER — Encounter (HOSPITAL_COMMUNITY): Payer: Self-pay | Admitting: Orthopedic Surgery

## 2023-01-12 ENCOUNTER — Encounter (HOSPITAL_COMMUNITY)
Admission: RE | Disposition: A | Discharge status: Skilled Nursing Facility | Payer: Self-pay | Source: Home / Self Care | Attending: Orthopedic Surgery

## 2023-01-12 ENCOUNTER — Inpatient Hospital Stay (HOSPITAL_COMMUNITY)
Admission: RE | Admit: 2023-01-12 | Discharge: 2023-01-16 | DRG: 455 | Disposition: A | Discharge status: Skilled Nursing Facility | Payer: Medicare Other | Attending: Orthopedic Surgery | Admitting: Orthopedic Surgery

## 2023-01-12 ENCOUNTER — Ambulatory Visit (HOSPITAL_COMMUNITY): Payer: Medicare Other | Admitting: Vascular Surgery

## 2023-01-12 DIAGNOSIS — Z751 Person awaiting admission to adequate facility elsewhere: Secondary | ICD-10-CM

## 2023-01-12 DIAGNOSIS — Z8616 Personal history of COVID-19: Secondary | ICD-10-CM | POA: Diagnosis not present

## 2023-01-12 DIAGNOSIS — Z880 Allergy status to penicillin: Secondary | ICD-10-CM

## 2023-01-12 DIAGNOSIS — M4326 Fusion of spine, lumbar region: Secondary | ICD-10-CM | POA: Diagnosis not present

## 2023-01-12 DIAGNOSIS — M79605 Pain in left leg: Secondary | ICD-10-CM | POA: Diagnosis not present

## 2023-01-12 DIAGNOSIS — E785 Hyperlipidemia, unspecified: Secondary | ICD-10-CM | POA: Diagnosis not present

## 2023-01-12 DIAGNOSIS — M545 Low back pain, unspecified: Secondary | ICD-10-CM | POA: Diagnosis not present

## 2023-01-12 DIAGNOSIS — M4316 Spondylolisthesis, lumbar region: Secondary | ICD-10-CM | POA: Diagnosis not present

## 2023-01-12 DIAGNOSIS — H04123 Dry eye syndrome of bilateral lacrimal glands: Secondary | ICD-10-CM | POA: Diagnosis not present

## 2023-01-12 DIAGNOSIS — Z7982 Long term (current) use of aspirin: Secondary | ICD-10-CM | POA: Diagnosis not present

## 2023-01-12 DIAGNOSIS — R2681 Unsteadiness on feet: Secondary | ICD-10-CM | POA: Diagnosis not present

## 2023-01-12 DIAGNOSIS — Z4789 Encounter for other orthopedic aftercare: Secondary | ICD-10-CM | POA: Diagnosis not present

## 2023-01-12 DIAGNOSIS — M48061 Spinal stenosis, lumbar region without neurogenic claudication: Secondary | ICD-10-CM | POA: Diagnosis not present

## 2023-01-12 DIAGNOSIS — M5416 Radiculopathy, lumbar region: Principal | ICD-10-CM

## 2023-01-12 DIAGNOSIS — Z743 Need for continuous supervision: Secondary | ICD-10-CM | POA: Diagnosis not present

## 2023-01-12 DIAGNOSIS — K219 Gastro-esophageal reflux disease without esophagitis: Secondary | ICD-10-CM | POA: Diagnosis present

## 2023-01-12 DIAGNOSIS — I872 Venous insufficiency (chronic) (peripheral): Secondary | ICD-10-CM | POA: Diagnosis not present

## 2023-01-12 DIAGNOSIS — Z96652 Presence of left artificial knee joint: Secondary | ICD-10-CM | POA: Diagnosis present

## 2023-01-12 DIAGNOSIS — G2581 Restless legs syndrome: Secondary | ICD-10-CM | POA: Diagnosis present

## 2023-01-12 DIAGNOSIS — Z8249 Family history of ischemic heart disease and other diseases of the circulatory system: Secondary | ICD-10-CM

## 2023-01-12 DIAGNOSIS — Z9109 Other allergy status, other than to drugs and biological substances: Secondary | ICD-10-CM | POA: Diagnosis not present

## 2023-01-12 DIAGNOSIS — K5900 Constipation, unspecified: Secondary | ICD-10-CM | POA: Diagnosis not present

## 2023-01-12 DIAGNOSIS — T884XXS Failed or difficult intubation, sequela: Secondary | ICD-10-CM | POA: Diagnosis not present

## 2023-01-12 DIAGNOSIS — Z803 Family history of malignant neoplasm of breast: Secondary | ICD-10-CM

## 2023-01-12 DIAGNOSIS — L299 Pruritus, unspecified: Secondary | ICD-10-CM | POA: Diagnosis not present

## 2023-01-12 DIAGNOSIS — G629 Polyneuropathy, unspecified: Secondary | ICD-10-CM | POA: Diagnosis present

## 2023-01-12 DIAGNOSIS — R112 Nausea with vomiting, unspecified: Secondary | ICD-10-CM | POA: Diagnosis not present

## 2023-01-12 DIAGNOSIS — M79604 Pain in right leg: Secondary | ICD-10-CM | POA: Diagnosis not present

## 2023-01-12 DIAGNOSIS — Z885 Allergy status to narcotic agent status: Secondary | ICD-10-CM | POA: Diagnosis not present

## 2023-01-12 DIAGNOSIS — M62838 Other muscle spasm: Secondary | ICD-10-CM | POA: Diagnosis present

## 2023-01-12 DIAGNOSIS — I10 Essential (primary) hypertension: Secondary | ICD-10-CM | POA: Diagnosis present

## 2023-01-12 DIAGNOSIS — G3184 Mild cognitive impairment, so stated: Secondary | ICD-10-CM | POA: Diagnosis present

## 2023-01-12 DIAGNOSIS — Z791 Long term (current) use of non-steroidal anti-inflammatories (NSAID): Secondary | ICD-10-CM | POA: Diagnosis not present

## 2023-01-12 DIAGNOSIS — R531 Weakness: Secondary | ICD-10-CM | POA: Diagnosis not present

## 2023-01-12 DIAGNOSIS — Z888 Allergy status to other drugs, medicaments and biological substances status: Secondary | ICD-10-CM | POA: Diagnosis not present

## 2023-01-12 DIAGNOSIS — K5793 Diverticulitis of intestine, part unspecified, without perforation or abscess with bleeding: Secondary | ICD-10-CM | POA: Diagnosis not present

## 2023-01-12 DIAGNOSIS — Z4889 Encounter for other specified surgical aftercare: Secondary | ICD-10-CM | POA: Diagnosis not present

## 2023-01-12 DIAGNOSIS — E7800 Pure hypercholesterolemia, unspecified: Secondary | ICD-10-CM | POA: Diagnosis present

## 2023-01-12 DIAGNOSIS — M1990 Unspecified osteoarthritis, unspecified site: Secondary | ICD-10-CM | POA: Diagnosis not present

## 2023-01-12 DIAGNOSIS — Z79899 Other long term (current) drug therapy: Secondary | ICD-10-CM

## 2023-01-12 DIAGNOSIS — Z882 Allergy status to sulfonamides status: Secondary | ICD-10-CM

## 2023-01-12 DIAGNOSIS — F32A Depression, unspecified: Secondary | ICD-10-CM | POA: Diagnosis not present

## 2023-01-12 DIAGNOSIS — Z981 Arthrodesis status: Secondary | ICD-10-CM | POA: Diagnosis not present

## 2023-01-12 HISTORY — PX: TRANSFORAMINAL LUMBAR INTERBODY FUSION (TLIF) WITH PEDICLE SCREW FIXATION 1 LEVEL: SHX6141

## 2023-01-12 SURGERY — TRANSFORAMINAL LUMBAR INTERBODY FUSION (TLIF) WITH PEDICLE SCREW FIXATION 1 LEVEL
Anesthesia: General | Site: Spine Lumbar | Laterality: Right

## 2023-01-12 MED ORDER — METHOCARBAMOL 1000 MG/10ML IJ SOLN: 500.0000 mg | Freq: Four times a day (QID) | INTRAVENOUS | Status: DC | PRN

## 2023-01-12 MED ORDER — KETAMINE HCL 50 MG/5ML IJ SOSY
PREFILLED_SYRINGE | INTRAMUSCULAR | Status: AC
  Filled 2023-01-12: qty 5

## 2023-01-12 MED ORDER — DOCUSATE SODIUM 100 MG PO CAPS
100.0000 mg | ORAL_CAPSULE | Freq: Two times a day (BID) | ORAL | Status: DC
  Administered 2023-01-12 – 2023-01-16 (×8): 100 mg via ORAL
  Filled 2023-01-12 (×8): qty 1

## 2023-01-12 MED ORDER — MENTHOL 3 MG MT LOZG: 1.0000 | LOZENGE | OROMUCOSAL | Status: DC | PRN

## 2023-01-12 MED ORDER — LACTATED RINGERS IV SOLN: INTRAVENOUS | Status: DC | PRN

## 2023-01-12 MED ORDER — SODIUM CHLORIDE 0.9 % IV SOLN
250.0000 mL | INTRAVENOUS | Status: DC
  Administered 2023-01-12: 250 mL via INTRAVENOUS

## 2023-01-12 MED ORDER — DULOXETINE HCL 60 MG PO CPEP
60.0000 mg | ORAL_CAPSULE | Freq: Every day | ORAL | Status: DC
  Administered 2023-01-13 – 2023-01-16 (×4): 60 mg via ORAL
  Filled 2023-01-12: qty 2
  Filled 2023-01-12 (×3): qty 1

## 2023-01-12 MED ORDER — ONDANSETRON HCL 4 MG PO TABS: 4.0000 mg | ORAL_TABLET | Freq: Four times a day (QID) | ORAL | Status: DC | PRN

## 2023-01-12 MED ORDER — KETAMINE HCL 10 MG/ML IJ SOLN
INTRAMUSCULAR | Status: DC | PRN
  Administered 2023-01-12 (×3): 10 mg via INTRAVENOUS

## 2023-01-12 MED ORDER — CYANOCOBALAMIN 500 MCG PO TABS
500.0000 ug | ORAL_TABLET | Freq: Every morning | ORAL | Status: DC
  Administered 2023-01-13 – 2023-01-16 (×4): 500 ug via ORAL
  Filled 2023-01-12 (×5): qty 1

## 2023-01-12 MED ORDER — SIMVASTATIN 20 MG PO TABS
40.0000 mg | ORAL_TABLET | Freq: Every day | ORAL | Status: DC
  Administered 2023-01-12 – 2023-01-15 (×4): 40 mg via ORAL
  Filled 2023-01-12 (×4): qty 2

## 2023-01-12 MED ORDER — EPINEPHRINE PF 1 MG/ML IJ SOLN
INTRAMUSCULAR | Status: AC
  Filled 2023-01-12: qty 1

## 2023-01-12 MED ORDER — FENTANYL CITRATE (PF) 250 MCG/5ML IJ SOLN
INTRAMUSCULAR | Status: DC | PRN
  Administered 2023-01-12: 50 ug via INTRAVENOUS
  Administered 2023-01-12: 100 ug via INTRAVENOUS
  Administered 2023-01-12 (×2): 50 ug via INTRAVENOUS

## 2023-01-12 MED ORDER — GABAPENTIN 300 MG PO CAPS
300.0000 mg | ORAL_CAPSULE | Freq: Four times a day (QID) | ORAL | Status: DC
  Administered 2023-01-12 – 2023-01-16 (×17): 300 mg via ORAL
  Filled 2023-01-12 (×17): qty 1

## 2023-01-12 MED ORDER — ROCURONIUM BROMIDE 10 MG/ML (PF) SYRINGE
PREFILLED_SYRINGE | INTRAVENOUS | Status: DC | PRN
  Administered 2023-01-12: 10 mg via INTRAVENOUS
  Administered 2023-01-12: 20 mg via INTRAVENOUS
  Administered 2023-01-12: 60 mg via INTRAVENOUS
  Administered 2023-01-12: 10 mg via INTRAVENOUS

## 2023-01-12 MED ORDER — PHENYLEPHRINE HCL-NACL 20-0.9 MG/250ML-% IV SOLN
INTRAVENOUS | Status: DC | PRN
  Administered 2023-01-12: 20 ug/min via INTRAVENOUS

## 2023-01-12 MED ORDER — ACETAMINOPHEN 325 MG PO TABS
650.0000 mg | ORAL_TABLET | ORAL | Status: DC | PRN
  Administered 2023-01-15 – 2023-01-16 (×2): 650 mg via ORAL
  Filled 2023-01-12 (×2): qty 2

## 2023-01-12 MED ORDER — PHENOL 1.4 % MT LIQD: 1.0000 | OROMUCOSAL | Status: DC | PRN

## 2023-01-12 MED ORDER — AMLODIPINE BESYLATE 2.5 MG PO TABS
2.5000 mg | ORAL_TABLET | Freq: Every day | ORAL | Status: DC
  Administered 2023-01-13 – 2023-01-16 (×4): 2.5 mg via ORAL
  Filled 2023-01-12 (×4): qty 1

## 2023-01-12 MED ORDER — MORPHINE SULFATE (PF) 2 MG/ML IV SOLN: 1.0000 mg | INTRAVENOUS | Status: DC | PRN

## 2023-01-12 MED ORDER — BUPIVACAINE HCL (PF) 0.25 % IJ SOLN
INTRAMUSCULAR | Status: AC
  Filled 2023-01-12: qty 30

## 2023-01-12 MED ORDER — POLYVINYL ALCOHOL 1.4 % OP SOLN: Freq: Four times a day (QID) | OPHTHALMIC | Status: DC | PRN

## 2023-01-12 MED ORDER — OXYCODONE-ACETAMINOPHEN 5-325 MG PO TABS: 1.0000 | ORAL_TABLET | ORAL | Status: DC | PRN

## 2023-01-12 MED ORDER — BUPIVACAINE LIPOSOME 1.3 % IJ SUSP
INTRAMUSCULAR | Status: DC | PRN
  Administered 2023-01-12: 20 mL

## 2023-01-12 MED ORDER — POVIDONE-IODINE 7.5 % EX SOLN: Freq: Once | CUTANEOUS | Status: DC

## 2023-01-12 MED ORDER — ACETAMINOPHEN 10 MG/ML IV SOLN
INTRAVENOUS | Status: DC | PRN
  Administered 2023-01-12: 1000 mg via INTRAVENOUS

## 2023-01-12 MED ORDER — ACETAMINOPHEN 10 MG/ML IV SOLN: 1000.0000 mg | Freq: Once | INTRAVENOUS | Status: DC | PRN

## 2023-01-12 MED ORDER — ACETAMINOPHEN 650 MG RE SUPP: 650.0000 mg | RECTAL | Status: DC | PRN

## 2023-01-12 MED ORDER — BUPIVACAINE LIPOSOME 1.3 % IJ SUSP
INTRAMUSCULAR | Status: AC
  Filled 2023-01-12: qty 20

## 2023-01-12 MED ORDER — VANCOMYCIN HCL IN DEXTROSE 1-5 GM/200ML-% IV SOLN
1000.0000 mg | Freq: Once | INTRAVENOUS | Status: AC
  Administered 2023-01-12: 1000 mg via INTRAVENOUS
  Filled 2023-01-12 (×2): qty 200

## 2023-01-12 MED ORDER — PANTOPRAZOLE SODIUM 40 MG PO TBEC
80.0000 mg | DELAYED_RELEASE_TABLET | Freq: Every day | ORAL | Status: DC
  Administered 2023-01-13 – 2023-01-16 (×4): 80 mg via ORAL
  Filled 2023-01-12 (×4): qty 2

## 2023-01-12 MED ORDER — DEXMEDETOMIDINE HCL IN NACL 80 MCG/20ML IV SOLN
INTRAVENOUS | Status: AC
  Filled 2023-01-12: qty 20

## 2023-01-12 MED ORDER — FENTANYL CITRATE (PF) 250 MCG/5ML IJ SOLN
INTRAMUSCULAR | Status: AC
  Filled 2023-01-12: qty 5

## 2023-01-12 MED ORDER — ACETAMINOPHEN 10 MG/ML IV SOLN
INTRAVENOUS | Status: AC
  Filled 2023-01-12: qty 100

## 2023-01-12 MED ORDER — THROMBIN 20000 UNITS EX SOLR
CUTANEOUS | Status: DC | PRN
  Administered 2023-01-12: 20 mL via TOPICAL

## 2023-01-12 MED ORDER — ONDANSETRON HCL 4 MG/2ML IJ SOLN: 4.0000 mg | Freq: Four times a day (QID) | INTRAMUSCULAR | Status: DC | PRN

## 2023-01-12 MED ORDER — FLEET ENEMA 7-19 GM/118ML RE ENEM
1.0000 | ENEMA | Freq: Once | RECTAL | Status: AC | PRN
  Administered 2023-01-15: 1 via RECTAL
  Filled 2023-01-12: qty 1

## 2023-01-12 MED ORDER — TRAZODONE HCL 50 MG PO TABS
50.0000 mg | ORAL_TABLET | Freq: Every day | ORAL | Status: DC
  Administered 2023-01-12 – 2023-01-15 (×4): 50 mg via ORAL
  Filled 2023-01-12 (×4): qty 1

## 2023-01-12 MED ORDER — BISACODYL 5 MG PO TBEC
5.0000 mg | DELAYED_RELEASE_TABLET | Freq: Every day | ORAL | Status: DC | PRN
  Administered 2023-01-15: 5 mg via ORAL
  Filled 2023-01-12 (×2): qty 1

## 2023-01-12 MED ORDER — METHOCARBAMOL 500 MG PO TABS
500.0000 mg | ORAL_TABLET | Freq: Four times a day (QID) | ORAL | Status: DC | PRN
  Administered 2023-01-12: 1000 mg via ORAL
  Administered 2023-01-13 (×2): 500 mg via ORAL
  Administered 2023-01-15: 1000 mg via ORAL
  Administered 2023-01-16: 500 mg via ORAL
  Filled 2023-01-12: qty 1
  Filled 2023-01-12: qty 2
  Filled 2023-01-12: qty 1
  Filled 2023-01-12: qty 2
  Filled 2023-01-12: qty 1

## 2023-01-12 MED ORDER — ROCURONIUM BROMIDE 10 MG/ML (PF) SYRINGE
PREFILLED_SYRINGE | INTRAVENOUS | Status: AC
  Filled 2023-01-12: qty 10

## 2023-01-12 MED ORDER — IPRATROPIUM-ALBUTEROL 0.5-2.5 (3) MG/3ML IN SOLN
RESPIRATORY_TRACT | Status: AC
  Filled 2023-01-12: qty 3

## 2023-01-12 MED ORDER — PRAMIPEXOLE DIHYDROCHLORIDE 0.25 MG PO TABS
0.2500 mg | ORAL_TABLET | Freq: Every day | ORAL | Status: DC
  Administered 2023-01-12 – 2023-01-15 (×4): 0.25 mg via ORAL
  Filled 2023-01-12 (×4): qty 1

## 2023-01-12 MED ORDER — SENNOSIDES-DOCUSATE SODIUM 8.6-50 MG PO TABS: 1.0000 | ORAL_TABLET | Freq: Every evening | ORAL | Status: DC | PRN

## 2023-01-12 MED ORDER — HYDROCODONE-ACETAMINOPHEN 5-325 MG PO TABS
1.0000 | ORAL_TABLET | ORAL | Status: DC | PRN
  Administered 2023-01-12 – 2023-01-13 (×4): 1 via ORAL
  Administered 2023-01-14 (×3): 2 via ORAL
  Filled 2023-01-12 (×3): qty 1
  Filled 2023-01-12: qty 2
  Filled 2023-01-12: qty 1
  Filled 2023-01-12: qty 2
  Filled 2023-01-12: qty 1
  Filled 2023-01-12: qty 2

## 2023-01-12 MED ORDER — THROMBIN 20000 UNITS EX SOLR
CUTANEOUS | Status: AC
  Filled 2023-01-12: qty 20000

## 2023-01-12 MED ORDER — LACTATED RINGERS IV SOLN: INTRAVENOUS | Status: DC

## 2023-01-12 MED ORDER — IPRATROPIUM-ALBUTEROL 0.5-2.5 (3) MG/3ML IN SOLN
3.0000 mL | Freq: Once | RESPIRATORY_TRACT | Status: AC
  Administered 2023-01-12: 3 mL via RESPIRATORY_TRACT

## 2023-01-12 MED ORDER — FENTANYL CITRATE (PF) 100 MCG/2ML IJ SOLN
INTRAMUSCULAR | Status: AC
  Filled 2023-01-12: qty 2

## 2023-01-12 MED ORDER — PROPOFOL 10 MG/ML IV BOLUS
INTRAVENOUS | Status: DC | PRN
  Administered 2023-01-12: 110 mg via INTRAVENOUS

## 2023-01-12 MED ORDER — POTASSIUM CHLORIDE IN NACL 20-0.9 MEQ/L-% IV SOLN: INTRAVENOUS | Status: DC

## 2023-01-12 MED ORDER — ALUM & MAG HYDROXIDE-SIMETH 200-200-20 MG/5ML PO SUSP
30.0000 mL | Freq: Four times a day (QID) | ORAL | Status: DC | PRN
  Administered 2023-01-15: 30 mL via ORAL
  Filled 2023-01-12: qty 30

## 2023-01-12 MED ORDER — FENTANYL CITRATE (PF) 100 MCG/2ML IJ SOLN
25.0000 ug | INTRAMUSCULAR | Status: DC | PRN
  Administered 2023-01-12 (×4): 25 ug via INTRAVENOUS

## 2023-01-12 MED ORDER — SODIUM CHLORIDE 0.9% FLUSH
3.0000 mL | Freq: Two times a day (BID) | INTRAVENOUS | Status: DC
  Administered 2023-01-12 – 2023-01-15 (×3): 3 mL via INTRAVENOUS

## 2023-01-12 MED ORDER — BUPIVACAINE-EPINEPHRINE 0.25% -1:200000 IJ SOLN
INTRAMUSCULAR | Status: DC | PRN
  Administered 2023-01-12: 20 mL
  Administered 2023-01-12: 8 mL

## 2023-01-12 MED ORDER — SUGAMMADEX SODIUM 200 MG/2ML IV SOLN
INTRAVENOUS | Status: DC | PRN
  Administered 2023-01-12: 154.2 mg via INTRAVENOUS

## 2023-01-12 MED ORDER — ONDANSETRON HCL 4 MG/2ML IJ SOLN
INTRAMUSCULAR | Status: DC | PRN
  Administered 2023-01-12: 4 mg via INTRAVENOUS

## 2023-01-12 MED ORDER — DIPHENHYDRAMINE HCL 25 MG PO CAPS
25.0000 mg | ORAL_CAPSULE | Freq: Four times a day (QID) | ORAL | Status: DC | PRN
  Administered 2023-01-12 – 2023-01-13 (×2): 25 mg via ORAL
  Filled 2023-01-12 (×2): qty 1

## 2023-01-12 MED ORDER — ZOLPIDEM TARTRATE 5 MG PO TABS: 5.0000 mg | ORAL_TABLET | Freq: Every evening | ORAL | Status: DC | PRN

## 2023-01-12 MED ORDER — SODIUM CHLORIDE 0.9% FLUSH: 3.0000 mL | INTRAVENOUS | Status: DC | PRN

## 2023-01-12 MED ORDER — 0.9 % SODIUM CHLORIDE (POUR BTL) OPTIME
TOPICAL | Status: DC | PRN
  Administered 2023-01-12: 1000 mL

## 2023-01-12 MED ORDER — TRAMADOL HCL 50 MG PO TABS
50.0000 mg | ORAL_TABLET | Freq: Four times a day (QID) | ORAL | Status: DC | PRN
  Administered 2023-01-15: 100 mg via ORAL
  Filled 2023-01-12: qty 2

## 2023-01-12 MED ORDER — CEFAZOLIN SODIUM-DEXTROSE 2-4 GM/100ML-% IV SOLN
2.0000 g | INTRAVENOUS | Status: AC
  Administered 2023-01-12: 2 g via INTRAVENOUS
  Filled 2023-01-12: qty 100

## 2023-01-12 MED ORDER — CHLORHEXIDINE GLUCONATE 0.12 % MT SOLN
15.0000 mL | Freq: Once | OROMUCOSAL | Status: AC
  Administered 2023-01-12: 15 mL via OROMUCOSAL
  Filled 2023-01-12: qty 15

## 2023-01-12 MED ORDER — ORAL CARE MOUTH RINSE: 15.0000 mL | Freq: Once | OROMUCOSAL | Status: AC

## 2023-01-12 MED ORDER — ONDANSETRON HCL 4 MG/2ML IJ SOLN: 4.0000 mg | Freq: Once | INTRAMUSCULAR | Status: DC | PRN

## 2023-01-12 MED ORDER — LIDOCAINE 2% (20 MG/ML) 5 ML SYRINGE
INTRAMUSCULAR | Status: DC | PRN
  Administered 2023-01-12: 100 mg via INTRAVENOUS

## 2023-01-12 MED ORDER — DIPHENHYDRAMINE HCL 50 MG/ML IJ SOLN
12.5000 mg | Freq: Four times a day (QID) | INTRAMUSCULAR | Status: DC | PRN
  Filled 2023-01-12: qty 1

## 2023-01-12 SURGICAL SUPPLY — 92 items
AGENT HMST KT MTR STRL THRMB (HEMOSTASIS)
APL SKNCLS STERI-STRIP NONHPOA (GAUZE/BANDAGES/DRESSINGS) ×1
BAG COUNTER SPONGE SURGICOUNT (BAG) ×1 IMPLANT
BAG SPNG CNTER NS LX DISP (BAG) ×1
BENZOIN TINCTURE PRP APPL 2/3 (GAUZE/BANDAGES/DRESSINGS) ×1 IMPLANT
BLADE CLIPPER SURG (BLADE) IMPLANT
BUR PRESCISION 1.7 ELITE (BURR) ×1 IMPLANT
BUR ROUND FLUTED 5 RND (BURR) ×1 IMPLANT
BUR ROUND PRECISION 4.0 (BURR) IMPLANT
BUR SABER RD CUTTING 3.0 (BURR) IMPLANT
CAGE SABLE 10X22 6-12 8D (Cage) IMPLANT
CANNULA GRAFT BNE VG PRE-FILL (Bone Implant) IMPLANT
CNTNR URN SCR LID CUP LEK RST (MISCELLANEOUS) ×1 IMPLANT
CONT SPEC 4OZ STRL OR WHT (MISCELLANEOUS) ×1
COVER BACK TABLE 60X90IN (DRAPES) ×1 IMPLANT
COVER MAYO STAND STRL (DRAPES) ×2 IMPLANT
COVER SURGICAL LIGHT HANDLE (MISCELLANEOUS) ×1 IMPLANT
DISPENSER GRAFT BNE VG (MISCELLANEOUS) IMPLANT
DISPENSER VIVIGEN BONE GRAFT (MISCELLANEOUS) ×1 IMPLANT
DRAIN CHANNEL 15F RND FF W/TCR (WOUND CARE) IMPLANT
DRAPE C-ARM 42X72 X-RAY (DRAPES) ×1 IMPLANT
DRAPE C-ARMOR (DRAPES) IMPLANT
DRAPE POUCH INSTRU U-SHP 10X18 (DRAPES) ×1 IMPLANT
DRAPE SURG 17X23 STRL (DRAPES) ×4 IMPLANT
DURAPREP 26ML APPLICATOR (WOUND CARE) ×1 IMPLANT
ELECT BLADE 4.0 EZ CLEAN MEGAD (MISCELLANEOUS) ×1
ELECT CAUTERY BLADE 6.4 (BLADE) ×1 IMPLANT
ELECT REM PT RETURN 9FT ADLT (ELECTROSURGICAL) ×1
ELECTRODE BLDE 4.0 EZ CLN MEGD (MISCELLANEOUS) ×1 IMPLANT
ELECTRODE REM PT RTRN 9FT ADLT (ELECTROSURGICAL) ×1 IMPLANT
EVACUATOR SILICONE 100CC (DRAIN) IMPLANT
FILTER STRAW FLUID ASPIR (MISCELLANEOUS) ×1 IMPLANT
GAUZE 4X4 16PLY ~~LOC~~+RFID DBL (SPONGE) ×1 IMPLANT
GAUZE SPONGE 4X4 12PLY STRL (GAUZE/BANDAGES/DRESSINGS) ×1 IMPLANT
GLOVE BIO SURGEON STRL SZ 6.5 (GLOVE) ×1 IMPLANT
GLOVE BIO SURGEON STRL SZ8 (GLOVE) ×1 IMPLANT
GLOVE BIOGEL PI IND STRL 7.0 (GLOVE) ×1 IMPLANT
GLOVE BIOGEL PI IND STRL 8 (GLOVE) ×1 IMPLANT
GLOVE SURG ENC MOIS LTX SZ6.5 (GLOVE) ×1 IMPLANT
GOWN STRL REUS W/ TWL LRG LVL3 (GOWN DISPOSABLE) ×2 IMPLANT
GOWN STRL REUS W/ TWL XL LVL3 (GOWN DISPOSABLE) ×1 IMPLANT
GOWN STRL REUS W/TWL LRG LVL3 (GOWN DISPOSABLE) ×2
GOWN STRL REUS W/TWL XL LVL3 (GOWN DISPOSABLE) ×1
GRAFT BONE CANNULA VIVIGEN 3 (Bone Implant) ×2 IMPLANT
IV CATH 14GX2 1/4 (CATHETERS) ×1 IMPLANT
KIT BASIN OR (CUSTOM PROCEDURE TRAY) ×1 IMPLANT
KIT POSITION SURG JACKSON T1 (MISCELLANEOUS) ×1 IMPLANT
KIT TURNOVER KIT B (KITS) ×1 IMPLANT
MARKER SKIN DUAL TIP RULER LAB (MISCELLANEOUS) ×2 IMPLANT
NDL 18GX1X1/2 (RX/OR ONLY) (NEEDLE) ×1 IMPLANT
NDL 22X1.5 STRL (OR ONLY) (MISCELLANEOUS) ×2 IMPLANT
NDL HYPO 25GX1X1/2 BEV (NEEDLE) ×1 IMPLANT
NDL SPNL 18GX3.5 QUINCKE PK (NEEDLE) ×2 IMPLANT
NEEDLE 18GX1X1/2 (RX/OR ONLY) (NEEDLE) ×1 IMPLANT
NEEDLE 22X1.5 STRL (OR ONLY) (MISCELLANEOUS) ×2 IMPLANT
NEEDLE HYPO 25GX1X1/2 BEV (NEEDLE) ×1 IMPLANT
NEEDLE SPNL 18GX3.5 QUINCKE PK (NEEDLE) ×2 IMPLANT
NS IRRIG 1000ML POUR BTL (IV SOLUTION) ×1 IMPLANT
PACK LAMINECTOMY ORTHO (CUSTOM PROCEDURE TRAY) ×1 IMPLANT
PACK UNIVERSAL I (CUSTOM PROCEDURE TRAY) ×1 IMPLANT
PAD ARMBOARD 7.5X6 YLW CONV (MISCELLANEOUS) ×2 IMPLANT
PATTIES SURGICAL .5 X1 (DISPOSABLE) ×1 IMPLANT
PATTIES SURGICAL .5X1.5 (GAUZE/BANDAGES/DRESSINGS) ×1 IMPLANT
PUTTY DBX 1CC (Putty) ×1 IMPLANT
PUTTY DBX 1CC DEPUY (Putty) IMPLANT
ROD PRE BENT EXPEDIUM 35MM (Rod) IMPLANT
SCREW CORTICAL VIPER 7X40MM (Screw) IMPLANT
SCREW SET SINGLE INNER (Screw) IMPLANT
SCREW VIPER CORT FIX 5.00X30 (Screw) IMPLANT
SCREW VIPER CORT FIX 6.00X30 (Screw) IMPLANT
SCREW VIPER CORTICAL FIX 6X40 (Screw) IMPLANT
SPONGE INTESTINAL PEANUT (DISPOSABLE) ×1 IMPLANT
SPONGE SURGIFOAM ABS GEL 100 (HEMOSTASIS) ×1 IMPLANT
STRIP CLOSURE SKIN 1/2X4 (GAUZE/BANDAGES/DRESSINGS) ×2 IMPLANT
SURGIFLO W/THROMBIN 8M KIT (HEMOSTASIS) IMPLANT
SUT MNCRL AB 4-0 PS2 18 (SUTURE) ×1 IMPLANT
SUT VIC AB 0 CT1 18XCR BRD 8 (SUTURE) ×1 IMPLANT
SUT VIC AB 0 CT1 8-18 (SUTURE) ×1
SUT VIC AB 1 CT1 18XCR BRD 8 (SUTURE) ×1 IMPLANT
SUT VIC AB 1 CT1 8-18 (SUTURE) ×1
SUT VIC AB 2-0 CT2 18 VCP726D (SUTURE) ×1 IMPLANT
SYR 20ML LL LF (SYRINGE) ×2 IMPLANT
SYR BULB IRRIG 60ML STRL (SYRINGE) ×1 IMPLANT
SYR CONTROL 10ML LL (SYRINGE) ×2 IMPLANT
SYR TB 1ML LUER SLIP (SYRINGE) ×1 IMPLANT
TAP EXPEDIUM DL 4.35 (INSTRUMENTS) IMPLANT
TAP EXPEDIUM DL 5.0 (INSTRUMENTS) IMPLANT
TAP EXPEDIUM DL 6.0 (INSTRUMENTS) IMPLANT
TRAY FOLEY MTR SLVR 16FR STAT (SET/KITS/TRAYS/PACK) ×1 IMPLANT
TUBE FUNNEL GL DISP (ORTHOPEDIC DISPOSABLE SUPPLIES) IMPLANT
WATER STERILE IRR 1000ML POUR (IV SOLUTION) ×1 IMPLANT
YANKAUER SUCT BULB TIP NO VENT (SUCTIONS) ×1 IMPLANT

## 2023-01-12 NOTE — H&P (Signed)
PREOPERATIVE H&P  Chief Complaint: Bilateral leg pain  HPI: Kimberly [name] is a 82 y.o. 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  Patient has failed multiple forms of conservative care and continues to have pain (see office notes for additional details regarding the patient's full course of treatment)  Past Medical History:  Diagnosis Date   Arthritis    Complication of anesthesia    Pt reports that she was told "someone had to breath for her" She is not clear what this means   Difficult intubation    Diverticulitis    GERD (gastroesophageal reflux disease)    History of COVID-19 05/2020   Hypercholesteremia    Hypertension    Mild cognitive impairment    Peripheral neuropathy    Restless leg syndrome    Past Surgical History:  Procedure Laterality Date   ABDOMINAL HYSTERECTOMY     Age 69   BACK SURGERY  2004   BREAST BIOPSY Left    COLONOSCOPY WITH PROPOFOL N/A 11/13/2020   Procedure: COLONOSCOPY WITH PROPOFOL;  Surgeon: Jeani Hawking, MD;  [facility]: [facility];  Service: Endoscopy;  Laterality: N/A;   ESOPHAGOGASTRODUODENOSCOPY (EGD) WITH PROPOFOL N/A 11/13/2020   Procedure: ESOPHAGOGASTRODUODENOSCOPY (EGD) WITH PROPOFOL;  Surgeon: Jeani Hawking, MD;  [facility];  Service: Endoscopy;  Laterality: N/A;   EYE SURGERY     bilateral cateract    POLYPECTOMY  11/13/2020   Procedure: POLYPECTOMY;  Surgeon: Jeani Hawking, MD;  [facility];  Service: Endoscopy;;   REVERSE SHOULDER ARTHROPLASTY Left 04/07/2022   Procedure: REVERSE SHOULDER ARTHROPLASTY;  Surgeon: Jones Broom, MD;  Location: [facility];  Service: Orthopedics;  Laterality: Left;   TONSILLECTOMY     As a child   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   Procedure: TOTAL KNEE ARTHROPLASTY;  Surgeon: Durene Romans, MD;  Location: [facility];  Service:  Orthopedics;  Laterality: Left;  70 mins   Social History   Socioeconomic History   Marital status: Widowed    Spouse name: Not on file   Number of children: 1   Years of education: 12   Highest education level: High school graduate  Occupational History   Occupation: Retired  Tobacco Use   Smoking status: Never   Smokeless tobacco: Never  Vaping Use   Vaping Use: Never used  Substance and Sexual Activity   Alcohol use: No   Drug use: No   Sexual activity: Not Currently  Other Topics Concern   Not on file  Social History Narrative   Lives at home alone.   Right-handed.   1 cup caffeine daily.   Social Determinants of Health   Financial Resource Strain: Not on file  Food Insecurity: Not on file  Transportation Needs: Not on file  Physical Activity: Not on file  Stress: Not on file  Social Connections: Not on file   Family History  Problem Relation Age of Onset   Breast cancer Sister    Other Mother        passed away at age 39   Heart attack Father        passed away at age 47   Allergies  Allergen Reactions   Amoxicillin Swelling    Throat felt swollen   Ciprofloxacin Itching   Adhesive [Tape] Itching    Redness   Benzoyl Peroxide Itching   Hydrocodone  Itching   Metronidazole Hives   Oxycodone Other (See Comments)    Felt disoriented   Sulfa Antibiotics Itching and Rash   Prior to Admission medications   Medication Sig Start Date End Date Taking? Authorizing Provider  acetaminophen (TYLENOL) 650 MG CR tablet Take 650-1,250 mg by mouth See admin instructions. Take 1250mg  in the AM, 1250mg  at noon, and 650mg  at bedtime.   Yes [provider]  amLODipine (NORVASC) 2.5 MG tablet Take 2.5 mg by mouth daily. 10/30/20  Yes [provider]  DULoxetine (CYMBALTA) 60 MG capsule TAKE 1 CAPSULE BY MOUTH EVERY DAY 11/16/22  Yes Levert Feinstein, MD  gabapentin (NEURONTIN) 300 MG capsule Take 300 mg by mouth 4 (four) times daily.   Yes [provider]  meloxicam (MOBIC) 15 MG tablet Take 15 mg by mouth daily.   Yes [provider]  omeprazole (PRILOSEC) 40 MG capsule Take 40 mg by mouth in the morning. 09/03/21  Yes [provider]  Polyethyl Glycol-Propyl Glycol (SYSTANE OP) Place 1 drop into both eyes every 6 (six) hours as needed (dry eyes).   Yes [provider]  pramipexole (MIRAPEX) 0.25 MG tablet Take 0.25 mg by mouth at bedtime. 10/21/20  Yes [provider]  simvastatin (ZOCOR) 40 MG tablet Take 40 mg by mouth at bedtime.    Yes [provider]  traMADol (ULTRAM) 50 MG tablet Take 1 tablet (50 mg total) by mouth every 6 (six) hours as needed for moderate pain. 04/08/22  Yes [place], Hospital doctor, PA-C  traZODone (DESYREL) 50 MG tablet TAKE 2 TABLETS BY MOUTH AT BEDTIME. Patient taking differently: Take 50 mg by mouth at bedtime. 02/17/22  Yes Levert Feinstein, MD  vitamin B-12 (CYANOCOBALAMIN) 500 MCG tablet Take 500 mcg by mouth in the morning.   Yes [provider]  aspirin EC 81 MG tablet Take 81 mg by mouth daily. Swallow whole. Patient not taking: Reported on 01/03/2023    [provider]  ferrous sulfate (FERROUSUL) 325 (65 FE) MG tablet Take 1 tablet (325 mg total) by mouth 3 (three) times daily with meals for 14 days. 12/04/19 09/03/20  Lanney Gins, PA-C  nortriptyline (PAMELOR) 25 MG capsule TAKE 3 CAPSULES BY MOUTH AT BEDTIME 11/20/19 09/03/20  Levert Feinstein, MD     All other systems have been reviewed and were otherwise negative with the exception of those mentioned in the HPI and as above.  Physical Exam: There were no vitals filed for this visit.  There is no height or weight on file to calculate BMI.  General: Alert, no acute distress Cardiovascular: No pedal edema Respiratory: No cyanosis, no use of accessory musculature Skin: No lesions in the area of chief complaint Neurologic: Sensation intact distally Psychiatric: Patient is competent for consent with normal mood  and affect Lymphatic: No axillary or cervical lymphadenopathy   Assessment/Plan: Ongoing chronic bilateral buttock pain, secondary to the patient's severe spinal stenosis and instability  at L4-L5  Plan for Procedure(s): RIGHT-SIDED TRSANSFORAMINAL LUMBAR INTERBODY FUSION LUMBAR 4 - LUMBAR 5  WITH INSTRUMENTATION AND ALLOGRAFT   Jackelyn Hoehn, MD 01/12/2023 7:37 AM

## 2023-01-12 NOTE — Plan of Care (Signed)

## 2023-01-12 NOTE — Op Note (Signed)
PATIENT NAME: Kimberly [name]   MEDICAL RECORD NO.:   [id number]   DATE OF BIRTH: 09-May-1941   DATE OF PROCEDURE: 01/12/2023                               OPERATIVE REPORT     PREOPERATIVE DIAGNOSES: 1. 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 L4-5 spinal stenosis, severe 3. Status post previous L4-5 decompression 4. L4-5 spondylolisthesis   POSTOPERATIVE DIAGNOSES: 1. 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 L4-5 spinal stenosis, severe 3. Status post previous L4-5 decompression 4. L4-5 spondylolisthesis   PROCEDURES: 1. L4/5 revision decompression 2. Right-sided L4-5 transforaminal lumbar interbody fusion. 3. Left-sided L4-5 posterolateral fusion. 4. Insertion of interbody device x1 (Globus expandable intervertebral spacer). 5. Placement of segmental posterior instrumentation L4, L5 bilaterally  6. Use of local autograft. 7. Use of morselized allograft - Vivigen 8. Intraoperative use of fluoroscopy.   SURGEON:  Estill Bamberg, MD.   ASSISTANTJason Coop, PA-C.   ANESTHESIA:  General endotracheal anesthesia.   COMPLICATIONS:  None.   DISPOSITION:  Stable.   ESTIMATED BLOOD LOSS:  100cc   INDICATIONS FOR SURGERY:  Briefly,  Kimberly [name] is a pleasant 82 year old female who did present to me with severe and ongoing pain in the right and left legs. I did feel that the symptoms were secondary to the findings noted above.   The patient failed conservative care and did wish to proceed with the procedure  noted above.   OPERATIVE DETAILS:  On 01/12/2023, the patient was brought to surgery and general endotracheal anesthesia was administered.  The patient was placed prone on a well-padded flat Jackson bed with a spinal frame.  Antibiotics were given and a time-out procedure was performed. The back was prepped and draped in the usual fashion.  A midline incision was made overlying the L4-5 intervertebral spaces.  The fascia was incised at the midline.  The  paraspinal musculature was bluntly swept laterally.  Anatomic landmarks for the pedicles were exposed. Using fluoroscopy, I did cannulate the L4 and L5 pedicles bilaterally, using a medial to lateral cortical trajectory technique.  At this point, 6 mm screws were placed into the left pedicles, and a 35 mm rod was placed into the tulip heads of the screw, and caps were also placed.  Distraction was then applied across the L4-5 intervertebral space, and the caps were then provisionally tightened.  On the right side, bone wax was placed into the cannulated pedicle holes.  I then proceeded with the decompressive aspect of the procedure at the L4-5 level.  A partial facetectomy was performed bilaterally at L4-5, decompressing the L4-5 intervertebral space.  I was very pleased with the decompression. With an assistant holding medial retraction of the traversing right L5 nerve, I did perform an annulotomy at the posterolateral aspect of the L4-5 intervertebral space.  I then used a series of curettes and pituitary rongeurs to perform a thorough and complete intervertebral diskectomy.  The intervertebral space was then liberally packed with autograft as well as allograft in the form of Vivigen, as was the appropriate-sized intervertebral spacer.  The spacer was then tamped into position in the usual fashion, and expanded to 9 mm in height. I was very pleased with the press-fit of the spacer.  I then placed 6 mm screws on the right at L4 and L5. A 35 mm rod was then placed and caps were placed. The distraction was  then released on the contralateral side.  All caps were then locked.  The wound was copiously irrigated with a total of approximately 3 L prior to placing the bone graft.  Additional autograft and allograft was then packed into the posterolateral gutter on the left side to help aid in the success of the fusion.  The wound was  explored for any undue bleeding and there was no substantial bleeding  encountered.  Gel-Foam was placed over the laminectomy site.  The wound was then closed in layers using #1 Vicryl followed by 2-0 Vicryl, followed by 4-0 Monocryl.  Benzoin and Steri-Strips were applied followed by sterile dressing.     Of note, Jason Coop was my assistant throughout surgery, and did aid in retraction, suctioning, the decompression, placement of the hardware, and closure.     Estill Bamberg, MD

## 2023-01-12 NOTE — Anesthesia Postprocedure Evaluation (Signed)
Anesthesia Post Note  Patient: Kimberly [name]  Procedure(s) Performed: RIGHT-SIDED TRSANSFORAMINAL LUMBAR INTERBODY FUSION LUMBAR 4 - LUMBAR 5  WITH INSTRUMENTATION AND ALLOGRAFT (Right: Spine Lumbar)     Patient location during evaluation: PACU Anesthesia Type: General Level of consciousness: awake and alert Pain management: pain level controlled Vital Signs Assessment: post-procedure vital signs reviewed and stable Respiratory status: spontaneous breathing, nonlabored ventilation, respiratory function stable and patient connected to nasal cannula oxygen Cardiovascular status: blood pressure returned to baseline and stable Postop Assessment: no apparent nausea or vomiting Anesthetic complications: no   No notable events documented.  Last Vitals:  Vitals:   01/12/23 1515 01/12/23 1542  BP: (!) 148/79 (!) 145/94  Pulse: 85 92  Resp: 12 20  Temp: 36.6 C 36.4 C  SpO2: 100% 97%    Last Pain:  Vitals:   01/12/23 1542  TempSrc: Oral  PainSc:                  [name]

## 2023-01-12 NOTE — Anesthesia Procedure Notes (Signed)
Procedure Name: Intubation Date/Time: 01/12/2023 10:50 AM  Performed by: Darryl Nestle, CRNAPre-anesthesia Checklist: Patient identified, Emergency Drugs available, Suction available and Patient being monitored Patient Re-evaluated:Patient Re-evaluated prior to induction Oxygen Delivery Method: Circle system utilized Preoxygenation: Pre-oxygenation with 100% oxygen Induction Type: IV induction Ventilation: Mask ventilation without difficulty Laryngoscope Size: Mac, 3 and Glidescope Grade View: Grade I Tube type: Oral Tube size: 7.0 mm Number of attempts: 1 Airway Equipment and Method: Stylet and Oral airway Placement Confirmation: ETT inserted through vocal cords under direct vision, positive ETCO2 and breath sounds checked- equal and bilateral Secured at: 21 cm Tube secured with: Tape Dental Injury: Teeth and Oropharynx as per pre-operative assessment

## 2023-01-12 NOTE — Transfer of Care (Signed)
Immediate Anesthesia Transfer of Care Note  Patient: Kimberly [name]  Procedure(s) Performed: RIGHT-SIDED TRSANSFORAMINAL LUMBAR INTERBODY FUSION LUMBAR 4 - LUMBAR 5  WITH INSTRUMENTATION AND ALLOGRAFT (Right: Spine Lumbar)  Patient Location: PACU  Anesthesia Type:General  Level of Consciousness: drowsy  Airway & Oxygen Therapy: Patient Spontanous Breathing and Patient connected to face mask oxygen  Post-op Assessment: Report given to RN and Post -op Vital signs reviewed and stable  Post vital signs: Reviewed and stable  Last Vitals:  Vitals Value Taken Time  BP 182/76 01/12/23 1402  Temp 98   Pulse 85 01/12/23 1412  Resp 17 01/12/23 1412  SpO2 97 % 01/12/23 1412  Vitals shown include unvalidated device data.  Last Pain:  Vitals:   01/12/23 0833  TempSrc:   PainSc: 5       Patients Stated Pain Goal: 0 (01/12/23 1610)  Complications: No notable events documented.

## 2023-01-13 DIAGNOSIS — M48061 Spinal stenosis, lumbar region without neurogenic claudication: Secondary | ICD-10-CM | POA: Diagnosis present

## 2023-01-13 DIAGNOSIS — M4316 Spondylolisthesis, lumbar region: Secondary | ICD-10-CM | POA: Diagnosis present

## 2023-01-13 DIAGNOSIS — T884XXS Failed or difficult intubation, sequela: Secondary | ICD-10-CM | POA: Diagnosis not present

## 2023-01-13 DIAGNOSIS — M4326 Fusion of spine, lumbar region: Secondary | ICD-10-CM | POA: Diagnosis not present

## 2023-01-13 DIAGNOSIS — G3184 Mild cognitive impairment, so stated: Secondary | ICD-10-CM | POA: Diagnosis present

## 2023-01-13 DIAGNOSIS — L299 Pruritus, unspecified: Secondary | ICD-10-CM | POA: Diagnosis not present

## 2023-01-13 DIAGNOSIS — G2581 Restless legs syndrome: Secondary | ICD-10-CM | POA: Diagnosis present

## 2023-01-13 DIAGNOSIS — G629 Polyneuropathy, unspecified: Secondary | ICD-10-CM | POA: Diagnosis present

## 2023-01-13 DIAGNOSIS — Z4789 Encounter for other orthopedic aftercare: Secondary | ICD-10-CM | POA: Diagnosis not present

## 2023-01-13 DIAGNOSIS — I10 Essential (primary) hypertension: Secondary | ICD-10-CM | POA: Diagnosis present

## 2023-01-13 DIAGNOSIS — Z8249 Family history of ischemic heart disease and other diseases of the circulatory system: Secondary | ICD-10-CM | POA: Diagnosis not present

## 2023-01-13 DIAGNOSIS — M79604 Pain in right leg: Secondary | ICD-10-CM | POA: Diagnosis not present

## 2023-01-13 DIAGNOSIS — Z96652 Presence of left artificial knee joint: Secondary | ICD-10-CM | POA: Diagnosis present

## 2023-01-13 DIAGNOSIS — H04123 Dry eye syndrome of bilateral lacrimal glands: Secondary | ICD-10-CM | POA: Diagnosis not present

## 2023-01-13 DIAGNOSIS — R2681 Unsteadiness on feet: Secondary | ICD-10-CM | POA: Diagnosis not present

## 2023-01-13 DIAGNOSIS — M1990 Unspecified osteoarthritis, unspecified site: Secondary | ICD-10-CM | POA: Diagnosis not present

## 2023-01-13 DIAGNOSIS — Z791 Long term (current) use of non-steroidal anti-inflammatories (NSAID): Secondary | ICD-10-CM | POA: Diagnosis not present

## 2023-01-13 DIAGNOSIS — Z882 Allergy status to sulfonamides status: Secondary | ICD-10-CM | POA: Diagnosis not present

## 2023-01-13 DIAGNOSIS — Z9109 Other allergy status, other than to drugs and biological substances: Secondary | ICD-10-CM | POA: Diagnosis not present

## 2023-01-13 DIAGNOSIS — K5793 Diverticulitis of intestine, part unspecified, without perforation or abscess with bleeding: Secondary | ICD-10-CM | POA: Diagnosis not present

## 2023-01-13 DIAGNOSIS — K5900 Constipation, unspecified: Secondary | ICD-10-CM | POA: Diagnosis not present

## 2023-01-13 DIAGNOSIS — I872 Venous insufficiency (chronic) (peripheral): Secondary | ICD-10-CM | POA: Diagnosis not present

## 2023-01-13 DIAGNOSIS — R531 Weakness: Secondary | ICD-10-CM | POA: Diagnosis not present

## 2023-01-13 DIAGNOSIS — Z888 Allergy status to other drugs, medicaments and biological substances status: Secondary | ICD-10-CM | POA: Diagnosis not present

## 2023-01-13 DIAGNOSIS — M5416 Radiculopathy, lumbar region: Secondary | ICD-10-CM | POA: Diagnosis present

## 2023-01-13 DIAGNOSIS — Z743 Need for continuous supervision: Secondary | ICD-10-CM | POA: Diagnosis not present

## 2023-01-13 DIAGNOSIS — F32A Depression, unspecified: Secondary | ICD-10-CM | POA: Diagnosis not present

## 2023-01-13 DIAGNOSIS — M79605 Pain in left leg: Secondary | ICD-10-CM | POA: Diagnosis not present

## 2023-01-13 DIAGNOSIS — Z4889 Encounter for other specified surgical aftercare: Secondary | ICD-10-CM | POA: Diagnosis not present

## 2023-01-13 DIAGNOSIS — E785 Hyperlipidemia, unspecified: Secondary | ICD-10-CM | POA: Diagnosis not present

## 2023-01-13 DIAGNOSIS — E7800 Pure hypercholesterolemia, unspecified: Secondary | ICD-10-CM | POA: Diagnosis present

## 2023-01-13 DIAGNOSIS — R112 Nausea with vomiting, unspecified: Secondary | ICD-10-CM | POA: Diagnosis not present

## 2023-01-13 DIAGNOSIS — Z79899 Other long term (current) drug therapy: Secondary | ICD-10-CM | POA: Diagnosis not present

## 2023-01-13 DIAGNOSIS — Z7982 Long term (current) use of aspirin: Secondary | ICD-10-CM | POA: Diagnosis not present

## 2023-01-13 DIAGNOSIS — Z885 Allergy status to narcotic agent status: Secondary | ICD-10-CM | POA: Diagnosis not present

## 2023-01-13 DIAGNOSIS — M62838 Other muscle spasm: Secondary | ICD-10-CM | POA: Diagnosis present

## 2023-01-13 DIAGNOSIS — K219 Gastro-esophageal reflux disease without esophagitis: Secondary | ICD-10-CM | POA: Diagnosis present

## 2023-01-13 DIAGNOSIS — Z803 Family history of malignant neoplasm of breast: Secondary | ICD-10-CM | POA: Diagnosis not present

## 2023-01-13 DIAGNOSIS — Z751 Person awaiting admission to adequate facility elsewhere: Secondary | ICD-10-CM | POA: Diagnosis not present

## 2023-01-13 DIAGNOSIS — Z880 Allergy status to penicillin: Secondary | ICD-10-CM | POA: Diagnosis not present

## 2023-01-13 DIAGNOSIS — Z8616 Personal history of COVID-19: Secondary | ICD-10-CM | POA: Diagnosis not present

## 2023-01-13 DIAGNOSIS — M545 Low back pain, unspecified: Secondary | ICD-10-CM | POA: Diagnosis not present

## 2023-01-13 NOTE — Plan of Care (Signed)
  Problem: Education: Goal: Knowledge of General Education information will improve Description: Including pain rating scale, medication(s)/side effects and non-pharmacologic comfort measures Outcome: Progressing   Problem: Health Behavior/Discharge Planning: Goal: Ability to manage health-related needs will improve Outcome: Progressing   Problem: Clinical Measurements: Goal: Ability to maintain clinical measurements within normal limits will improve Outcome: Progressing Goal: Will remain free from infection Outcome: Progressing Goal: Diagnostic test results will improve Outcome: Progressing Goal: Respiratory complications will improve Outcome: Progressing Goal: Cardiovascular complication will be avoided Outcome: Completed/Met   Problem: Activity: Goal: Risk for activity intolerance will decrease Outcome: Progressing   Problem: Nutrition: Goal: Adequate nutrition will be maintained Outcome: Completed/Met   Problem: Coping: Goal: Level of anxiety will decrease Outcome: Progressing   Problem: Elimination: Goal: Will not experience complications related to bowel motility Outcome: Progressing   Problem: Pain Managment: Goal: General experience of comfort will improve Outcome: Progressing   Problem: Safety: Goal: Ability to remain free from injury will improve Outcome: Progressing   Problem: Skin Integrity: Goal: Risk for impaired skin integrity will decrease Outcome: Progressing

## 2023-01-13 NOTE — Progress Notes (Signed)
    Patient doing well  Denies leg pain Has been ambulating   Physical Exam: Vitals:   01/13/23 0008 01/13/23 0443  BP: 138/69 (!) 144/78  Pulse: 100 100  Resp: 20 20  Temp: 99 F (37.2 C) 99 F (37.2 C)  SpO2: 94% 94%    Dressing in place NVI  POD #1 s/p L4/5 decompression, doing well  - up with PT/OT, encourage ambulation - Percocet for pain, Robaxin for muscle spasms - transfer to rehab once bed available, with subsequent f/u in 2 weeks

## 2023-01-13 NOTE — TOC Progression Note (Addendum)
Transition of Care [facility]) - Progression Note    Patient Details  Name: Kimberly [name] MRN: [id number] Date of Birth: [birth year]  Transition of Care [facility] CM/SW Contact  Baldemar Lenis, [place] Phone Number: 01/13/2023, 1:43 PM  Clinical Narrative:   CSW met with patient's daughter, Kimberly [name], to provide bed offers. CSW answered questions and provided information to daughter on how to research her options. Daughter to look at choices available and update CSW with choice.  UPDATE: CSW received call from daughter that she has still been unable to make a decision, is continuing to review the options. CSW informed daughter that a note would be left for the weekend to contact her to discuss further.     Expected Discharge Plan: Skilled Nursing Facility Barriers to Discharge: Continued Medical Work up  Expected Discharge Plan and Services In-house Referral: Clinical Social Work   Post Acute Care Choice: Skilled Nursing Facility Living arrangements for the past 2 months: Apartment                                       Social Determinants of Health (SDOH) Interventions SDOH Screenings   Food Insecurity: No Food Insecurity (01/12/2023)  Housing: Low Risk  (01/12/2023)  Transportation Needs: No Transportation Needs (01/12/2023)  Utilities: Not At Risk (01/12/2023)  Depression (PHQ2-9): Low Risk  (12/28/2022)  Tobacco Use: Low Risk  (01/12/2023)    Readmission Risk Interventions     No data to display

## 2023-01-13 NOTE — Progress Notes (Addendum)
Patient alert and oriented x4, awaiting rehab placement, patient transfer to [facility]06 per order, daughter notified.

## 2023-01-13 NOTE — Evaluation (Signed)
Occupational Therapy Evaluation Patient Details Name: Kimberly [name] MRN: [id number] DOB: March 28, 1941 Today's Date: 01/13/2023   History of Present Illness Pt is an 82 y.o. female s/p R sided transforaminal lumbar interbody fusion L4-5 with instrumentation and allograft on thr R. PMH significant for arthritis, diverticulitis, GERD, COVID-19, mild cognitive impairment, peripheral neuropathy, back surgery, and L TKA.   Clinical Impression   PTA, pt performing BADl with mod I within her home. Upon eval, pt performing brace application with max A, LB Adl with min A with AE and max cues for new learning. Max education regarding precautions and pt unable to recall independently at end of session. Pt educated and demonstrating use of compensatory techniques for LB Adl, grooming, and toileting this session with cues for maintenance of precautions and new learning. Will continue to follow acutely. Patient will benefit from continued inpatient follow up therapy, <3 hours/day.      Recommendations for follow up therapy are one component of a multi-disciplinary discharge planning process, led by the attending physician.  Recommendations may be updated based on patient status, additional functional criteria and insurance authorization.   Assistance Recommended at Discharge Frequent or constant Supervision/Assistance  Patient can return home with the following A little help with walking and/or transfers;A lot of help with bathing/dressing/bathroom;Assistance with cooking/housework;Direct supervision/assist for medications management;Direct supervision/assist for financial management;Assist for transportation;Help with stairs or ramp for entrance    Functional Status Assessment  Patient has had a recent decline in their functional status and demonstrates the ability to make significant improvements in function in a reasonable and predictable amount of time.  Equipment Recommendations  Other (comment)  (defer)    Recommendations for Other Services       Precautions / Restrictions Precautions Precautions: Fall;Back Precaution Booklet Issued: Yes (comment) Precaution Comments: reviewed BLT rules, log roll for bed mobility Required Braces or Orthoses: Spinal Brace Spinal Brace: Thoracolumbosacral orthotic;Applied in sitting position Restrictions Weight Bearing Restrictions: No      Mobility Bed Mobility               General bed mobility comments: in recliner on arrival and departure    Transfers Overall transfer level: Needs assistance Equipment used: Rolling walker (2 wheels) Transfers: Sit to/from Stand Sit to Stand: Min assist           General transfer comment: assist to rise from low surface for power up      Balance Overall balance assessment: Needs assistance Sitting-balance support: No upper extremity supported, Feet supported Sitting balance-Leahy Scale: Fair     Standing balance support: Bilateral upper extremity supported, During functional activity Standing balance-Leahy Scale: Poor Standing balance comment: reliant on external support                           ADL either performed or assessed with clinical judgement   ADL Overall ADL's : Needs assistance/impaired Eating/Feeding: Modified independent;Bed level   Grooming: Set up;Sitting Grooming Details (indicate cue type and reason): cues for use of cup of water vs cup to spit approprately Upper Body Bathing: Min guard;Sitting   Lower Body Bathing: Sit to/from stand;Moderate assistance   Upper Body Dressing : Maximal assistance Upper Body Dressing Details (indicate cue type and reason): for brace Lower Body Dressing: Minimal assistance;Sitting/lateral leans;With adaptive equipment Lower Body Dressing Details (indicate cue type and reason): to doff/don socks with AE; max cues for new learning Toilet Transfer: Min guard;Rolling walker (2 wheels);BSC/3in1;Ambulation  Toileting - Clothing Manipulation Details (indicate cue type and reason): educated regarding compensatory techniques     Functional mobility during ADLs: Min guard;Rolling walker (2 wheels) General ADL Comments: 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 Difficulty recalling precautions despite errorless learning,. Able to state back to therapist immediately, but not after 1+ minute. max multimodal cueing for new learning     Vision Baseline Vision/History: 1 Wears glasses Ability to See in Adequate Light: 0 Adequate Patient Visual Report: No change from baseline Additional Comments: incresed time to read "no bending, No lifting, No twisting" at top of handout     Perception     Praxis      Pertinent Vitals/Pain Pain Assessment Pain Assessment: Faces Faces Pain Scale: Hurts even more Pain Location: back Pain Descriptors / Indicators: Sore Pain Intervention(s): Limited activity within patient's tolerance, Monitored during session     Hand Dominance Right   Extremity/Trunk Assessment Upper Extremity Assessment Upper Extremity Assessment: Generalized weakness   Lower Extremity Assessment Lower Extremity Assessment: Defer to PT evaluation   Cervical / Trunk Assessment Cervical / Trunk Assessment: Kyphotic;Back Surgery   Communication Communication Communication: No difficulties   Cognition Arousal/Alertness: Lethargic Behavior During Therapy: Flat affect Overall Cognitive Status: History of cognitive impairments - at baseline Area of Impairment: Attention, Memory, Following commands, Problem solving                   Current Attention Level: Sustained Memory: Decreased recall of precautions Following Commands: Follows one step commands with increased time     Problem Solving: Slow processing, Decreased initiation, Difficulty sequencing, Requires verbal cues, Requires tactile cues General Comments: history of MCI, drowsy, states "I can't wake up". Periods of falling asleep  during session. Pt tells PT about home environment, does not mention she is going SNF until MD walks in     General Comments  VSS    Exercises     Shoulder Instructions      Home Living Family/patient expects to be discharged to:: Skilled nursing facility Living Arrangements: Alone (Dog Dierdre Harness) Available Help at Discharge: Family;Available PRN/intermittently Type of Home: House Home Access: Level entry     Home Layout: One level               Home Equipment: Agricultural consultant (2 wheels)          Prior Functioning/Environment Prior Level of Function : Independent/Modified Independent             Mobility Comments: pt reports independence with mobility at baseline ADLs Comments: Pt reports independent and driving; more recently, pt's daughter has been performing grocery shopping for the pt        OT Problem List: Decreased strength;Decreased activity tolerance;Impaired balance (sitting and/or standing);Pain;Decreased knowledge of use of DME or AE;Decreased knowledge of precautions;Decreased cognition      OT Treatment/Interventions: Self-care/ADL training;Therapeutic exercise;DME and/or AE instruction;Patient/family education;Balance training;Therapeutic activities    OT Goals(Current goals can be found in the care plan section) Acute Rehab OT Goals Patient Stated Goal: get better OT Goal Formulation: With patient Time For Goal Achievement: 01/27/23 Potential to Achieve Goals: Good ADL Goals Pt Will Perform Grooming: with supervision;standing Pt Will Perform Upper Body Dressing: sitting;with mod assist Pt Will Perform Lower Body Dressing: sit to/from stand;with min guard assist Pt Will Transfer to Toilet: with supervision;ambulating;bedside commode Additional ADL Goal #1: Pt will recall 3/3 spinal precautions to optimize functional perofrmance in ADL within precautions  OT Frequency: Min 2X/week    Co-evaluation  AM-PAC OT "6 Clicks" Daily  Activity     Outcome Measure Help from another person eating meals?: None Help from another person taking care of personal grooming?: A Little Help from another person toileting, which includes using toliet, bedpan, or urinal?: A Lot Help from another person bathing (including washing, rinsing, drying)?: A Lot Help from another person to put on and taking off regular upper body clothing?: A Lot Help from another person to put on and taking off regular lower body clothing?: A Lot 6 Click Score: 15   End of Session Equipment Utilized During Treatment: Gait belt;Rolling walker (2 wheels);Back brace;Other (comment) (AE) Nurse Communication: Mobility status  Activity Tolerance: Patient tolerated treatment well Patient left: in chair;with call bell/phone within reach  OT Visit Diagnosis: Unsteadiness on feet (R26.81);Muscle weakness (generalized) (M62.81);Other symptoms and signs involving cognitive function                Time: 4098-1191 OT Time Calculation (min): 33 min Charges:  OT General Charges $OT Visit: 1 Visit OT Evaluation $OT Eval Low Complexity: 1 Low OT Treatments $Self Care/Home Management : 8-22 mins  Tyler Deis, OTR/L [facility] Acute Rehabilitation Office: [phone]   Myrla Halsted 01/13/2023, 9:50 AM

## 2023-01-13 NOTE — [place] FL2 (Signed)
[place] MEDICAID FL2 LEVEL OF CARE FORM     IDENTIFICATION  Patient Name: Kimberly [name] Birthdate: 1941-05-24 Sex: female Admission Date (Current Location): 01/12/2023  [facility] and [place] Number:  Producer, television/film/video and Address:  [facility], 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   B12 deficiency 01/25/2022   Left hip pain 01/25/2022   Pain in both lower extremities 11/24/2021   Neuropathy 11/24/2021   Pain in left ankle and joints of left foot 11/24/2021   Disorder of muscle, unspecified 11/24/2021   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/24/2021   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   Chronic low back pain 03/25/2019   Gait abnormality 05/07/2018   Restless leg syndrome 07/28/2017   Paresthesia 07/04/2017   Restless leg 07/04/2017    Orientation RESPIRATION BLADDER Height & Weight     Self, Time, Situation, Place  Normal Continent Weight: 170 lb (77.1 kg) Height:  5\' 2"  (157.5 cm)  BEHAVIORAL SYMPTOMS/MOOD NEUROLOGICAL BOWEL NUTRITION STATUS      Continent Diet (regular)  AMBULATORY STATUS COMMUNICATION OF NEEDS Skin   Limited Assist Verbally Surgical wounds (closed back, gauze dressing)                       Personal Care Assistance Level of Assistance  Bathing, Feeding, Dressing Bathing Assistance: Limited assistance Feeding assistance: Independent Dressing Assistance: Limited assistance      Functional Limitations Info             SPECIAL CARE FACTORS FREQUENCY  PT (By licensed PT), OT (By licensed OT)     PT Frequency: 5x/wk OT Frequency: 5x/wk            Contractures Contractures Info: Not present    Additional Factors Info  Code Status, Allergies, Psychotropic Code Status Info: Full Allergies Info: Amoxicillin, Ciprofloxacin, Adhesive (Tape), Benzoyl Peroxide, Hydrocodone, Metronidazole, Oxycodone, Betadine (Povidone Iodine), Sulfa Antibiotics Psychotropic Info: Cymbalta 60mg  daily         Current Medications (01/13/2023):  This is the current hospital active medication list Current Facility-Administered Medications  Medication Dose Route Frequency Provider Last Rate Last Admin   0.9 %  sodium chloride infusion  250 mL Intravenous Continuous McKenzie, Eilene Ghazi, PA-C 1 mL/hr at 01/13/23 0354 Infusion Verify at 01/13/23 0354   0.9 % NaCl with KCl 20 mEq/ L  infusion   Intravenous Continuous McKenzie, Kayla J, PA-C       acetaminophen (TYLENOL) tablet 650 mg  650 mg Oral Q4H PRN McKenzie, Kayla J, PA-C       Or   acetaminophen (TYLENOL) suppository 650 mg  650 mg Rectal Q4H PRN McKenzie, Kayla J, PA-C       alum & mag hydroxide-simeth (MAALOX/MYLANTA) 200-200-20 MG/5ML suspension 30 mL  30 mL Oral Q6H PRN McKenzie, Kayla J, PA-C       amLODipine (NORVASC) tablet 2.5 mg  2.5 mg Oral  Daily McKenzie, Eilene Ghazi, PA-C   2.5 mg at 01/13/23 1043   bisacodyl (DULCOLAX) EC tablet 5 mg  5 mg Oral Daily PRN McKenzie, Kayla J, PA-C       cyanocobalamin (VITAMIN B12) tablet 500 mcg  500 mcg Oral q AM McKenzie, Kayla J, PA-C   500 mcg at 01/13/23 1610   diphenhydrAMINE (BENADRYL) capsule 25-50 mg  25-50 mg Oral Q6H PRN McKenzieEilene Ghazi, PA-C   25 mg at 01/13/23 0446   Or   diphenhydrAMINE (BENADRYL) injection 12.5 mg  12.5 mg Intramuscular Q6H PRN McKenzie, Kayla J, PA-C       docusate sodium (COLACE) capsule 100 mg  100 mg Oral BID McKenzie, Kayla J, PA-C   100 mg at  01/13/23 1043   DULoxetine (CYMBALTA) DR capsule 60 mg  60 mg Oral Daily McKenzie, Eilene Ghazi, PA-C   60 mg at 01/13/23 1043   gabapentin (NEURONTIN) capsule 300 mg  300 mg Oral QID McKenzie, Kayla J, PA-C   300 mg at 01/13/23 1043   HYDROcodone-acetaminophen (NORCO/VICODIN) 5-325 MG per tablet 1-2 tablet  1-2 tablet Oral Q4H PRN Georga Bora, PA-C   1 tablet at 01/13/23 0445   menthol-cetylpyridinium (CEPACOL) lozenge 3 mg  1 lozenge Oral PRN McKenzie, Kayla J, PA-C       Or   phenol (CHLORASEPTIC) mouth spray 1 spray  1 spray Mouth/Throat PRN McKenzie, Kayla J, PA-C       methocarbamol (ROBAXIN) tablet 500-1,000 mg  500-1,000 mg Oral Q6H PRN McKenzie, Kayla J, PA-C   500 mg at 01/13/23 0445   Or   methocarbamol (ROBAXIN) 500 mg in dextrose 5 % 50 mL IVPB  500 mg Intravenous Q6H PRN McKenzie, Kayla J, PA-C       morphine (PF) 2 MG/ML injection 1-2 mg  1-2 mg Intravenous Q2H PRN McKenzie, Kayla J, PA-C       ondansetron (ZOFRAN) tablet 4 mg  4 mg Oral Q6H PRN McKenzie, Kayla J, PA-C       Or   ondansetron (ZOFRAN) injection 4 mg  4 mg Intravenous Q6H PRN McKenzie, Kayla J, PA-C       pantoprazole (PROTONIX) EC tablet 80 mg  80 mg Oral Daily McKenzie, Eilene Ghazi, PA-C   80 mg at 01/13/23 1043   polyvinyl alcohol (LIQUIFILM TEARS) 1.4 % ophthalmic solution   Both Eyes Q6H PRN McKenzie, Kayla J, PA-C       pramipexole (MIRAPEX) tablet 0.25 mg  0.25 mg Oral QHS McKenzie, Kayla J, PA-C   0.25 mg at 01/12/23 2128   senna-docusate (Senokot-S) tablet 1 tablet  1 tablet Oral QHS PRN McKenzie, Kayla J, PA-C       simvastatin (ZOCOR) tablet 40 mg  40 mg Oral QHS McKenzie, Kayla J, PA-C   40 mg at 01/12/23 2128   sodium chloride flush (NS) 0.9 % injection 3 mL  3 mL Intravenous Q12H McKenzie, Eilene Ghazi, PA-C   3 mL at 01/12/23 2131   sodium chloride flush (NS) 0.9 % injection 3 mL  3 mL Intravenous PRN McKenzie, Kayla J, PA-C       sodium phosphate (FLEET) 7-19 GM/118ML enema 1 enema  1 enema Rectal Once PRN  McKenzie, Kayla J, PA-C       traMADol (ULTRAM) tablet 50-100 mg  50-100 mg Oral Q6H PRN Estill Bamberg, MD       traZODone (DESYREL) tablet 50 mg  50 mg Oral QHS McKenzie, Eilene Ghazi, PA-C  50 mg at 01/12/23 2128   zolpidem (AMBIEN) tablet 5 mg  5 mg Oral QHS PRN Georga Bora, PA-C         Discharge Medications: Please see discharge summary for a list of discharge medications.  Relevant Imaging Results:  Relevant Lab Results:   Additional Information SS#: [id number]  Baldemar Lenis, LCSW

## 2023-01-13 NOTE — TOC Initial Note (Signed)
Transition of Care [facility]) - Initial/Assessment Note    Patient Details  Name: Kimberly [name] MRN: [id number] Date of Birth: Nov 08, 1940  [facility]) CM/SW Contact:    Kermit Balo, RN Phone Number: 01/13/2023, 9:58 AM  Clinical Narrative:                 Patient s/p  RIGHT-SIDED TRSANSFORAMINAL LUMBAR INTERBODY FUSION LUMBAR 4 - LUMBAR 5  WITH INSTRUMENTATION AND ALLOGRAFT. Current recommendations are for SNF rehab. Pt is agreeable. She asked to be faxed out in the [facility] area. CM provided her the list of medicare.gov facilities.  TOC will provide her choice when available.  TOC following.  Expected Discharge Plan: Skilled Nursing Facility Barriers to Discharge: Continued Medical Work up   Patient Goals and CMS Choice   CMS Medicare.gov Compare Post Acute Care list provided to:: Patient Choice offered to / list presented to : Patient, Adult Children      Expected Discharge Plan and Services In-house Referral: Clinical Social Work   Post Acute Care Choice: Skilled Nursing Facility Living arrangements for the past 2 months: Apartment                                      Prior Living Arrangements/Services Living arrangements for the past 2 months: Apartment Lives with:: Self Patient language and need for interpreter reviewed:: Yes Do you feel safe going back to the place where you live?: Yes      Need for Family Participation in Patient Care: Yes (Comment) Care giver support system in place?: No (comment)   Criminal Activity/Legal Involvement Pertinent to Current Situation/Hospitalization: No - Comment as needed  Activities of Daily Living Home Assistive Devices/Equipment: Cane (specify quad or straight), Eyeglasses, Shower chair with back, Environmental consultant (specify type), Blood pressure cuff ADL Screening (condition at time of admission) Patient's cognitive ability adequate to safely complete daily activities?: Yes Is the patient deaf or have  difficulty hearing?: Yes Does the patient have difficulty seeing, even when wearing glasses/contacts?: No Does the patient have difficulty concentrating, remembering, or making decisions?: Yes Patient able to express need for assistance with ADLs?: Yes Does the patient have difficulty dressing or bathing?: Yes Independently performs ADLs?: Yes (appropriate for developmental age) Does the patient have difficulty walking or climbing stairs?: Yes Weakness of Legs: Both Weakness of Arms/Hands: None  Permission Sought/Granted                  Emotional Assessment Appearance:: Appears stated age Attitude/Demeanor/Rapport: Engaged Affect (typically observed): Accepting Orientation: : Oriented to Self, Oriented to Place, Oriented to  Time, Oriented to Situation   Psych Involvement: No (comment)  Admission diagnosis:  Radiculopathy of lumbar region [M54.16] Patient Active Problem List   Diagnosis Date Noted   Radiculopathy of lumbar region 01/12/2023   Hypertension 12/28/2022   Hyperlipidemia 12/28/2022   S/P reverse total shoulder arthroplasty, 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 - [address] AT [address] OF [facility] [address] [place] [address] Phone: [phone] Fax: [phone]     Social Determinants of Health (SDOH) Social History: SDOH Screenings   Food Insecurity: No Food Insecurity (01/12/2023)  Housing: Low Risk   (01/12/2023)  Transportation Needs: No Transportation Needs (01/12/2023)  Utilities: Not At Risk (01/12/2023)  Depression (PHQ2-9): Low Risk  (12/28/2022)  Tobacco Use: Low Risk  (01/12/2023)   SDOH Interventions:     Readmission Risk Interventions     No data to display

## 2023-01-13 NOTE — Evaluation (Signed)
Physical Therapy Evaluation Patient Details Name: Kimberly [name] MRN: [id number] DOB: 03/05/1941 Today's Date: 01/13/2023  History of Present Illness  Pt is an 82 y.o. female s/p R sided transforaminal lumbar interbody fusion L4-5 with instrumentation and allograft on thr R. PMH significant for arthritis, diverticulitis, GERD, COVID-19, mild cognitive impairment, peripheral neuropathy, back surgery, and L TKA.  Clinical Impression   Pt presents with generalized weakness, post-op back pain, difficulty mobilizing OOB, impaired gait, and decreased activity tolerance. Pt to benefit from acute PT to address deficits. Pt ambulated hallway distance with use of RW, pt requiring close guard to light physical assist for mobility at this time. Pt is drowsy throughout session, states she feels like she can't wake up. Pt plans to d/c to post-acute rehabilitation facility given she lives alone and has limited assist. PT to progress mobility as tolerated, and will continue to follow acutely.         Recommendations for follow up therapy are one component of a multi-disciplinary discharge planning process, led by the attending physician.  Recommendations may be updated based on patient status, additional functional criteria and insurance authorization.  Follow Up Recommendations       Assistance Recommended at Discharge Intermittent Supervision/Assistance  Patient can return home with the following  A little help with walking and/or transfers;A little help with bathing/dressing/bathroom    Equipment Recommendations None recommended by PT  Recommendations for Other Services       Functional Status Assessment Patient has had a recent decline in their functional status and demonstrates the ability to make significant improvements in function in a reasonable and predictable amount of time.     Precautions / Restrictions Precautions Precautions: Fall;Back Precaution Booklet Issued: Yes  (comment) Precaution Comments: reviewed BLT rules, log roll for bed mobility Required Braces or Orthoses: Spinal Brace Spinal Brace: Thoracolumbosacral orthotic;Applied in sitting position Restrictions Weight Bearing Restrictions: No      Mobility  Bed Mobility Overal bed mobility: Needs Assistance Bed Mobility: Rolling, Sidelying to Sit Rolling: Min assist Sidelying to sit: Min assist       General bed mobility comments: assist for LE progression over EOB, trunk elevation    Transfers Overall transfer level: Needs assistance Equipment used: Rolling walker (2 wheels) Transfers: Sit to/from Stand Sit to Stand: Min assist           General transfer comment: assist to rise from low surface for power up, stand x2 from EOB and toilet    Ambulation/Gait Ambulation/Gait assistance: Min guard Gait Distance (Feet): 75 Feet Assistive device: Rolling walker (2 wheels) Gait Pattern/deviations: Step-through pattern, Decreased stride length, Trunk flexed Gait velocity: decr     General Gait Details: cues for placement in RW, upright posture  Stairs            Wheelchair Mobility    Modified Rankin (Stroke Patients Only)       Balance Overall balance assessment: Needs assistance Sitting-balance support: No upper extremity supported, Feet supported Sitting balance-Leahy Scale: Fair     Standing balance support: Bilateral upper extremity supported, During functional activity Standing balance-Leahy Scale: Poor Standing balance comment: reliant on external support                             Pertinent Vitals/Pain Pain Assessment Pain Assessment: 0-10 Pain Score: 8  Pain Location: back Pain Descriptors / Indicators: Sore Pain Intervention(s): Limited activity within patient's tolerance, Monitored during session, Repositioned  Home Living Family/patient expects to be discharged to:: Skilled nursing facility Living Arrangements: Alone Available  Help at Discharge: Family;Available PRN/intermittently Type of Home: House Home Access: Level entry       Home Layout: One level Home Equipment: Agricultural consultant (2 wheels)      Prior Function Prior Level of Function : Independent/Modified Independent             Mobility Comments: pt reports independence with mobility at baseline       Hand Dominance   Dominant Hand: Right    Extremity/Trunk Assessment   Upper Extremity Assessment Upper Extremity Assessment: Defer to OT evaluation    Lower Extremity Assessment Lower Extremity Assessment: Generalized weakness    Cervical / Trunk Assessment Cervical / Trunk Assessment: Kyphotic;Back Surgery  Communication   Communication: No difficulties  Cognition Arousal/Alertness: Lethargic Behavior During Therapy: Flat affect Overall Cognitive Status: History of cognitive impairments - at baseline Area of Impairment: Attention, Memory, Following commands, Problem solving                   Current Attention Level: Sustained Memory: Decreased recall of precautions Following Commands: Follows one step commands with increased time     Problem Solving: Slow processing, Decreased initiation, Difficulty sequencing, Requires verbal cues, Requires tactile cues General Comments: history of MCI, drowsy, states "I can't wake up". Periods of falling asleep during session. Pt tells PT about home environment, does not mention she is going SNF until MD walks in        General Comments      Exercises     Assessment/Plan    PT Assessment Patient needs continued PT services  PT Problem List Decreased strength;Decreased mobility;Decreased activity tolerance;Decreased balance;Decreased knowledge of use of DME;Pain;Decreased knowledge of precautions;Decreased safety awareness       PT Treatment Interventions DME instruction;Therapeutic activities;Gait training;Patient/family education;Therapeutic exercise;Balance  training;Functional mobility training;Neuromuscular re-education    PT Goals (Current goals can be found in the Care Plan section)  Acute Rehab PT Goals Patient Stated Goal: back to independent PT Goal Formulation: With patient Time For Goal Achievement: 01/27/23 Potential to Achieve Goals: Good    Frequency Min 5X/week     Co-evaluation               AM-PAC PT "6 Clicks" Mobility  Outcome Measure Help needed turning from your back to your side while in a flat bed without using bedrails?: A Little Help needed moving from lying on your back to sitting on the side of a flat bed without using bedrails?: A Little Help needed moving to and from a bed to a chair (including a wheelchair)?: A Little Help needed standing up from a chair using your arms (e.g., wheelchair or bedside chair)?: A Little Help needed to walk in hospital room?: A Little Help needed climbing 3-5 steps with a railing? : A Little 6 Click Score: 18    End of Session Equipment Utilized During Treatment: Back brace Activity Tolerance: Patient tolerated treatment well;Patient limited by fatigue Patient left: in chair;with call bell/phone within reach;Other (comment) (OT arriving to room shortly) Nurse Communication: Mobility status PT Visit Diagnosis: Other abnormalities of gait and mobility (R26.89);Muscle weakness (generalized) (M62.81)    Time: 1610-9604 PT Time Calculation (min) (ACUTE ONLY): 24 min   Charges:   PT Evaluation $PT Eval Low Complexity: 1 Low PT Treatments $Therapeutic Activity: 8-22 mins        Royetta Probus S, PT DPT Acute Rehabilitation Services Secure Chat Preferred  Office [phone]   Truddie Coco 01/13/2023, 9:16 AM

## 2023-01-14 MED ORDER — ONDANSETRON HCL 4 MG PO TABS: 4.0000 mg | ORAL_TABLET | Freq: Four times a day (QID) | ORAL | 0 refills | Status: DC | PRN

## 2023-01-14 MED ORDER — HYDROCODONE-ACETAMINOPHEN 5-325 MG PO TABS: 1.0000 | ORAL_TABLET | ORAL | 0 refills | Status: DC | PRN

## 2023-01-14 MED ORDER — METHOCARBAMOL 500 MG PO TABS: 500.0000 mg | ORAL_TABLET | Freq: Four times a day (QID) | ORAL | 0 refills | Status: DC | PRN

## 2023-01-14 MED ORDER — DOCUSATE SODIUM 100 MG PO CAPS: 100.0000 mg | ORAL_CAPSULE | Freq: Two times a day (BID) | ORAL | 0 refills | Status: DC

## 2023-01-14 MED ORDER — DIPHENHYDRAMINE HCL 25 MG PO CAPS: 25.0000 mg | ORAL_CAPSULE | Freq: Four times a day (QID) | ORAL | 0 refills | Status: AC | PRN

## 2023-01-14 NOTE — TOC Progression Note (Addendum)
Transition of Care [facility]) - Progression Note    Patient Details  Name: Dorissa Lonzo MRN: [id number] Date of Birth: 1940-11-05  [facility]) CM/SW Contact  Donnalee Curry, LCSWA Phone Number: 01/14/2023, 9:09 AM  Clinical Narrative:     SW spoke with pt's daughter Okey Regal [phone]) reports preference is for [facility] (which she is planning to tour today) and [facility] as second choice. SW explained [facility] has not made a final decision and if they do not accept, will have to move forward with [facility]. Okey Regal verbalized understanding.   SW attempted to call [facility] [phone]) but admissions out until Monday.   SW left message with French Ana ([facility] [phone])  Update 213pm SW spoke with French Ana ([facility]) will need someone to review again and f/u on bed availability.  Expected Discharge Plan: Skilled Nursing Facility Barriers to Discharge: Continued Medical Work up  Expected Discharge Plan and Services In-house Referral: Clinical Social Work   Post Acute Care Choice: Skilled Nursing Facility Living arrangements for the past 2 months: Apartment Expected Discharge Date: 01/14/23                                     Social Determinants of Health (SDOH) Interventions SDOH Screenings   Food Insecurity: No Food Insecurity (01/12/2023)  Housing: Low Risk  (01/12/2023)  Transportation Needs: No Transportation Needs (01/12/2023)  Utilities: Not At Risk (01/12/2023)  Depression (PHQ2-9): Low Risk  (12/28/2022)  Tobacco Use: Low Risk  (01/12/2023)    Readmission Risk Interventions     No data to display

## 2023-01-14 NOTE — Discharge Summary (Signed)
Patient ID: Kimberly [name] MRN: [id number] DOB/AGE: June 11, 1941 82 y.o.  Admit date: 01/12/2023 Discharge date: 01/16/2023  Admission Diagnoses:  Principal Problem:   Radiculopathy of lumbar region   Discharge Diagnoses:  Same  Past Medical History:  Diagnosis Date   Arthritis    Complication of anesthesia    Pt reports that she was told "someone had to breath for her" She is not clear what this means   Difficult intubation    Diverticulitis    GERD (gastroesophageal reflux disease)    History of COVID-19 05/2020   Hypercholesteremia    Hypertension    Mild cognitive impairment    Peripheral neuropathy    Restless leg syndrome     Surgeries: Procedure(s): RIGHT-SIDED TRSANSFORAMINAL LUMBAR INTERBODY FUSION LUMBAR 4 - LUMBAR 5  WITH INSTRUMENTATION AND ALLOGRAFT on 01/12/2023   Consultants: None  Discharged Condition: Improved  Hospital Course: Kimberly [name] is an 82 y.o. female who was admitted 01/12/2023 for operative treatment of Radiculopathy of lumbar region. Patient has severe unremitting pain that affects sleep, daily activities, and work/hobbies. After pre-op clearance the patient was taken to the operating room on 01/12/2023 and underwent  Procedure(s): RIGHT-SIDED TRSANSFORAMINAL LUMBAR INTERBODY FUSION LUMBAR 4 - LUMBAR 5  WITH INSTRUMENTATION AND ALLOGRAFT.    Patient was given perioperative antibiotics:  Anti-infectives (From admission, onward)    Start     Dose/Rate Route Frequency Ordered Stop   01/12/23 1700  vancomycin (VANCOCIN) IVPB 1000 mg/200 mL premix        1,000 mg 200 mL/hr over 60 Minutes Intravenous  Once 01/12/23 1501 01/13/23 1547   01/12/23 0800  ceFAZolin (ANCEF) IVPB 2g/100 mL premix        2 g 200 mL/hr over 30 Minutes Intravenous On call to O.R. 01/12/23 0751 01/12/23 1101        Patient was given sequential compression devices, early ambulation to prevent DVT.  Patient benefited maximally from hospital stay and  there were no complications.    Recent vital signs: Patient Vitals for the past 24 hrs:  BP Temp Temp src Pulse Resp SpO2  01/14/23 0755 135/68 98.7 F (37.1 C) Oral (!) 109 18 --  01/14/23 0436 (!) 160/78 98.6 F (37 C) Oral 83 15 100 %  01/13/23 2056 (!) 149/64 (!) 100.7 F (38.2 C) Oral -- 16 97 %  01/13/23 1549 (!) 146/76 98.4 F (36.9 C) Oral 96 18 94 %  01/13/23 1204 (!) 153/65 99.9 F (37.7 C) Oral (!) 111 18 91 %     Discharge Medications:   Allergies as of 01/14/2023       Reactions   Amoxicillin Swelling   Throat felt swollen   Ciprofloxacin Itching   Adhesive [tape] Itching   Redness   Benzoyl Peroxide Itching   Hydrocodone Itching   Metronidazole Hives   Oxycodone Other (See Comments)   Felt disoriented   Betadine [povidone Iodine] Rash   Sulfa Antibiotics Itching, Rash        Medication List     TAKE these medications    acetaminophen 650 MG CR tablet Commonly known as: TYLENOL Take 650-1,250 mg by mouth See admin instructions. Take 1250mg  in the AM, 1250mg  at noon, and 650mg  at bedtime.   amLODipine 2.5 MG tablet Commonly known as: NORVASC Take 2.5 mg by mouth daily.   aspirin EC 81 MG tablet Take 81 mg by mouth daily. Swallow whole.   diphenhydrAMINE 25 mg capsule Commonly known as: BENADRYL  Take 1-2 capsules (25-50 mg total) by mouth every 6 (six) hours as needed for itching (Dose with px medications).   docusate sodium 100 MG capsule Commonly known as: COLACE Take 1 capsule (100 mg total) by mouth 2 (two) times daily.   DULoxetine 60 MG capsule Commonly known as: CYMBALTA TAKE 1 CAPSULE BY MOUTH EVERY DAY   gabapentin 300 MG capsule Commonly known as: NEURONTIN Take 300 mg by mouth 4 (four) times daily.   HYDROcodone-acetaminophen 5-325 MG tablet Commonly known as: NORCO/VICODIN Take 1-2 tablets by mouth every 4 (four) hours as needed for moderate pain ((score 4 to 6)).   methocarbamol 500 MG tablet Commonly known as:  ROBAXIN Take 1-2 tablets (500-1,000 mg total) by mouth every 6 (six) hours as needed for muscle spasms.   omeprazole 40 MG capsule Commonly known as: PRILOSEC Take 40 mg by mouth in the morning.   ondansetron 4 MG tablet Commonly known as: ZOFRAN Take 1 tablet (4 mg total) by mouth every 6 (six) hours as needed for nausea or vomiting.   pramipexole 0.25 MG tablet Commonly known as: MIRAPEX Take 0.25 mg by mouth at bedtime.   simvastatin 40 MG tablet Commonly known as: ZOCOR Take 40 mg by mouth at bedtime.   SYSTANE OP Place 1 drop into both eyes every 6 (six) hours as needed (dry eyes).   traMADol 50 MG tablet Commonly known as: ULTRAM Take 1 tablet (50 mg total) by mouth every 6 (six) hours as needed for moderate pain.   traZODone 50 MG tablet Commonly known as: DESYREL TAKE 2 TABLETS BY MOUTH AT BEDTIME. What changed: how much to take   vitamin B-12 500 MCG tablet Commonly known as: CYANOCOBALAMIN Take 500 mcg by mouth in the morning.               Durable Medical Equipment  (From admission, onward)           Start     Ordered   01/12/23 1454  DME Walker rolling  Once       Question Answer Comment  Walker: With 5 Inch Wheels   Patient needs a walker to treat with the following condition Status post lumbar spinal fusion      01/12/23 1453            Diagnostic Studies: DG Lumbar Spine 2-3 Views  Result Date: 01/12/2023 CLINICAL DATA:  [id number] Elective surgery [id number] EXAM: LUMBAR SPINE - 2-3 VIEW COMPARISON:  Jan 12, 2023 FINDINGS: Spot fluoroscopy images were obtained for surgical planning purposes. This demonstrates patient is status post posterior fixation of L4-5 with intervertebral spacer placement. Time: 62.4 seconds Dose: 53.89 mGy Please reference procedure report for further details. IMPRESSION: Spot fluoroscopy images obtained for surgical planning purposes. Electronically Signed   By: Meda Klinefelter M.D.   On: 01/12/2023 15:21   DG  C-Arm 1-60 Min-No Report  Result Date: 01/12/2023 Fluoroscopy was utilized by the requesting physician.  No radiographic interpretation.   DG C-Arm 1-60 Min-No Report  Result Date: 01/12/2023 Fluoroscopy was utilized by the requesting physician.  No radiographic interpretation.   DG C-Arm 1-60 Min-No Report  Result Date: 01/12/2023 Fluoroscopy was utilized by the requesting physician.  No radiographic interpretation.   DG Lumbar Spine 1 View  Result Date: 01/12/2023 CLINICAL DATA:  [id number] Elective surgery [id number] EXAM: LUMBAR SPINE - 1 VIEW COMPARISON:  Lumbar spine MRI 06/18/2022. FINDINGS: Intraoperative lateral radiograph localization. Two needles are in place, upper needle at the level  of L2 and lower needle at the level of L4. IMPRESSION: Intraoperative localization as described above. Electronically Signed   By: Caprice Renshaw M.D.   On: 01/12/2023 13:26    Disposition: Discharge disposition: 03-Skilled Nursing Facility       Discharge Instructions     Discharge patient   Complete by: As directed    Discharge disposition: 03-Skilled Nursing Facility   Discharge patient date: 01/14/2023      POD #3 s/p L4-5 decompression and fusion   - up with PT/OT, encourage ambulation - Norco for pain, Robaxin for muscle spasms - d/c to SNF when bed available with f/u in 2 weeks -Scripts for pain signed and in chart -D/C instructions sheet printed and in chart -D/C today  -F/U in office 2 weeks   Signed: Georga Bora 01/14/2023, 8:49 AM

## 2023-01-14 NOTE — Progress Notes (Signed)
    Patient doing well, pain well controlled, only complaint is hip pain likely from being in bed so much and TLSO brace. Brace shortened to fit around waist and attempt to keep off hips   Physical Exam: Vitals:   01/14/23 0436 01/14/23 0755  BP: (!) 160/78 135/68  Pulse: 83 (!) 109  Resp: 15 18  Temp: 98.6 F (37 C) 98.7 F (37.1 C)  SpO2: 100%     Dressing in place NVI  POD #2 s/p L4-5 decompression and fusion  - up with PT/OT, encourage ambulation - Norco for pain, Robaxin for muscle spasms - d/c to SNF when bed available with f/u in 2 weeks

## 2023-01-15 NOTE — Progress Notes (Signed)
    Patient doing well with expected PO LBP. She states she has not yet ambulated in the hall just around the room and does not have a walker in her room. She feels her pain is well controlled. Awaiting SNF placement    Physical Exam: Vitals:   01/15/23 0426 01/15/23 0900  BP: (!) 163/85 (!) 153/69  Pulse: (!) 107 (!) 111  Resp: 16 20  Temp: 98.4 F (36.9 C) 98 F (36.7 C)  SpO2: 99% 100%    Dressing in place CDI, sitting up comfortably in chair TLSO brace worn incorrectly, straps should be UNDER arms and lower portion about the patients waist.  NVI  POD #3 s/p L4-5 decompression and fusion   - TLSO brace at all times when upright, if recllined 45 deg or more can remove. STRAPS UNDER ARMS to "sandwich" pt, NOT over shoulders like backpack - up with PT/OT, encourage ambulation, pt should have walker and attempt longer ambulation in hallway  - Push fluids - Norco for pain, Robaxin for muscle spasms - d/c to SNF when bed available with f/u in 2 weeks

## 2023-01-15 NOTE — Plan of Care (Signed)
  Problem: Education: Goal: Knowledge of General Education information will improve Description: Including pain rating scale, medication(s)/side effects and non-pharmacologic comfort measures Outcome: Progressing   Problem: Health Behavior/Discharge Planning: Goal: Ability to manage health-related needs will improve Outcome: Progressing   Problem: Clinical Measurements: Goal: Ability to maintain clinical measurements within normal limits will improve Outcome: Progressing Goal: Will remain free from infection Outcome: Progressing Goal: Diagnostic test results will improve Outcome: Progressing Goal: Respiratory complications will improve Outcome: Progressing   Problem: Activity: Goal: Risk for activity intolerance will decrease Outcome: Progressing   Problem: Coping: Goal: Level of anxiety will decrease Outcome: Progressing   Problem: Elimination: Goal: Will not experience complications related to bowel motility Outcome: Progressing Goal: Will not experience complications related to urinary retention Outcome: Progressing   Problem: Pain Managment: Goal: General experience of comfort will improve Outcome: Progressing   Problem: Safety: Goal: Ability to remain free from injury will improve Outcome: Progressing   Problem: Skin Integrity: Goal: Risk for impaired skin integrity will decrease Outcome: Progressing   Problem: Education: Goal: Ability to verbalize activity precautions or restrictions will improve Outcome: Progressing Goal: Knowledge of the prescribed therapeutic regimen will improve Outcome: Progressing Goal: Understanding of discharge needs will improve Outcome: Progressing   Problem: Activity: Goal: Ability to avoid complications of mobility impairment will improve Outcome: Progressing Goal: Ability to tolerate increased activity will improve Outcome: Progressing Goal: Will remain free from falls Outcome: Progressing   Problem:  Bowel/Gastric: Goal: Gastrointestinal status for postoperative course will improve Outcome: Progressing   Problem: Clinical Measurements: Goal: Ability to maintain clinical measurements within normal limits will improve Outcome: Progressing Goal: Postoperative complications will be avoided or minimized Outcome: Progressing Goal: Diagnostic test results will improve Outcome: Progressing   Problem: Pain Management: Goal: Pain level will decrease Outcome: Progressing   Problem: Skin Integrity: Goal: Will show signs of wound healing Outcome: Progressing   Problem: Health Behavior/Discharge Planning: Goal: Identification of resources available to assist in meeting health care needs will improve Outcome: Progressing   Problem: Bladder/Genitourinary: Goal: Urinary functional status for postoperative course will improve Outcome: Progressing

## 2023-01-15 NOTE — Plan of Care (Signed)
  Problem: Education: Goal: Knowledge of General Education information will improve Description: Including pain rating scale, medication(s)/side effects and non-pharmacologic comfort measures Outcome: Progressing   Problem: Health Behavior/Discharge Planning: Goal: Ability to manage health-related needs will improve Outcome: Progressing   Problem: Clinical Measurements: Goal: Ability to maintain clinical measurements within normal limits will improve Outcome: Progressing Goal: Will remain free from infection Outcome: Progressing Goal: Diagnostic test results will improve Outcome: Progressing Goal: Respiratory complications will improve Outcome: Progressing   Problem: Activity: Goal: Risk for activity intolerance will decrease Outcome: Progressing   Problem: Coping: Goal: Level of anxiety will decrease Outcome: Progressing   Problem: Elimination: Goal: Will not experience complications related to bowel motility Outcome: Progressing Goal: Will not experience complications related to urinary retention Outcome: Progressing   Problem: Pain Managment: Goal: General experience of comfort will improve Outcome: Progressing   Problem: Safety: Goal: Ability to remain free from injury will improve Outcome: Progressing

## 2023-01-16 DIAGNOSIS — Z4889 Encounter for other specified surgical aftercare: Secondary | ICD-10-CM | POA: Diagnosis not present

## 2023-01-16 DIAGNOSIS — F32A Depression, unspecified: Secondary | ICD-10-CM | POA: Diagnosis not present

## 2023-01-16 DIAGNOSIS — M4316 Spondylolisthesis, lumbar region: Secondary | ICD-10-CM | POA: Diagnosis not present

## 2023-01-16 DIAGNOSIS — K5900 Constipation, unspecified: Secondary | ICD-10-CM | POA: Diagnosis not present

## 2023-01-16 DIAGNOSIS — L299 Pruritus, unspecified: Secondary | ICD-10-CM | POA: Diagnosis not present

## 2023-01-16 DIAGNOSIS — R2681 Unsteadiness on feet: Secondary | ICD-10-CM | POA: Diagnosis not present

## 2023-01-16 DIAGNOSIS — Z8616 Personal history of COVID-19: Secondary | ICD-10-CM | POA: Diagnosis not present

## 2023-01-16 DIAGNOSIS — I10 Essential (primary) hypertension: Secondary | ICD-10-CM | POA: Diagnosis not present

## 2023-01-16 DIAGNOSIS — M79605 Pain in left leg: Secondary | ICD-10-CM | POA: Diagnosis not present

## 2023-01-16 DIAGNOSIS — Z4789 Encounter for other orthopedic aftercare: Secondary | ICD-10-CM | POA: Diagnosis not present

## 2023-01-16 DIAGNOSIS — E785 Hyperlipidemia, unspecified: Secondary | ICD-10-CM | POA: Diagnosis not present

## 2023-01-16 DIAGNOSIS — R112 Nausea with vomiting, unspecified: Secondary | ICD-10-CM | POA: Diagnosis not present

## 2023-01-16 DIAGNOSIS — Z743 Need for continuous supervision: Secondary | ICD-10-CM | POA: Diagnosis not present

## 2023-01-16 DIAGNOSIS — G3184 Mild cognitive impairment, so stated: Secondary | ICD-10-CM | POA: Diagnosis not present

## 2023-01-16 DIAGNOSIS — M4326 Fusion of spine, lumbar region: Secondary | ICD-10-CM | POA: Diagnosis not present

## 2023-01-16 DIAGNOSIS — K219 Gastro-esophageal reflux disease without esophagitis: Secondary | ICD-10-CM | POA: Diagnosis not present

## 2023-01-16 DIAGNOSIS — G629 Polyneuropathy, unspecified: Secondary | ICD-10-CM | POA: Diagnosis not present

## 2023-01-16 DIAGNOSIS — M545 Low back pain, unspecified: Secondary | ICD-10-CM | POA: Diagnosis not present

## 2023-01-16 DIAGNOSIS — H04123 Dry eye syndrome of bilateral lacrimal glands: Secondary | ICD-10-CM | POA: Diagnosis not present

## 2023-01-16 DIAGNOSIS — I872 Venous insufficiency (chronic) (peripheral): Secondary | ICD-10-CM | POA: Diagnosis not present

## 2023-01-16 DIAGNOSIS — G2581 Restless legs syndrome: Secondary | ICD-10-CM | POA: Diagnosis not present

## 2023-01-16 DIAGNOSIS — M1990 Unspecified osteoarthritis, unspecified site: Secondary | ICD-10-CM | POA: Diagnosis not present

## 2023-01-16 DIAGNOSIS — Z96652 Presence of left artificial knee joint: Secondary | ICD-10-CM | POA: Diagnosis not present

## 2023-01-16 DIAGNOSIS — M5416 Radiculopathy, lumbar region: Secondary | ICD-10-CM | POA: Diagnosis not present

## 2023-01-16 DIAGNOSIS — M62838 Other muscle spasm: Secondary | ICD-10-CM | POA: Diagnosis not present

## 2023-01-16 DIAGNOSIS — R531 Weakness: Secondary | ICD-10-CM | POA: Diagnosis not present

## 2023-01-16 DIAGNOSIS — K5793 Diverticulitis of intestine, part unspecified, without perforation or abscess with bleeding: Secondary | ICD-10-CM | POA: Diagnosis not present

## 2023-01-16 DIAGNOSIS — M79604 Pain in right leg: Secondary | ICD-10-CM | POA: Diagnosis not present
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
## 2023-01-16 NOTE — TOC Transition Note (Signed)
Transition of Care [facility]) - CM/SW Discharge Note   Patient Details  Name: Kimberly [name] MRN: [id number] Date of Birth: March 11, 1941  [facility]) CM/SW Contact:  Lorri Frederick, LCSW Phone Number: 01/16/2023, 2:19 PM   Clinical Narrative:   Pt discharging to [facility].  RN call report to [phone].     Final next level of care: Skilled Nursing Facility Barriers to Discharge: Barriers Resolved   Patient Goals and CMS Choice CMS Medicare.gov Compare Post Acute Care list provided to:: Patient Choice offered to / list presented to : Patient, Adult Children  Discharge Placement                Patient chooses bed at: [facility], [place] Patient to be transferred to facility by: [facility] Name of family member notified: daughter Okey Regal in room Patient and family notified of of transfer: 01/16/23  Discharge Plan and Services Additional resources added to the After Visit Summary for   In-house Referral: Clinical Social Work   Post Acute Care Choice: Skilled Nursing Facility                               Social Determinants of Health (SDOH) Interventions SDOH Screenings   Food Insecurity: No Food Insecurity (01/12/2023)  Housing: Low Risk  (01/12/2023)  Transportation Needs: No Transportation Needs (01/12/2023)  Utilities: Not At Risk (01/12/2023)  Depression (PHQ2-9): Low Risk  (12/28/2022)  Tobacco Use: Low Risk  (01/12/2023)     Readmission Risk Interventions     No data to display

## 2023-01-16 NOTE — Progress Notes (Signed)
Patient was evaluated at her bedside, with her daughter present.  Patient doing well.  Clinical social worker, Tammy Sours, came into the room during my evaluation, and at that time, they did except an offer to [facility].  Patient will be transferred today.

## 2023-01-16 NOTE — Progress Notes (Signed)
Occupational Therapy Treatment Patient Details Name: Kimberly [name] MRN: [id number] DOB: 10-Jul-1941 Today's Date: 01/16/2023   History of present illness Pt is an 82 y.o. female s/p R sided transforaminal lumbar interbody fusion L4-5 with instrumentation and allograft on thr R. PMH significant for arthritis, diverticulitis, GERD, COVID-19, mild cognitive impairment, peripheral neuropathy, back surgery, and L TKA.   OT comments  Pt continuing to progress towards pt focused goals, educated pt on the use of hip kit to complete lower body dressing and bathing while maintaining back precautions. Pt needed cues to sequence steps for new learning, successfully donned L sock using reacher and sockaid. Educated pt on donning/doffing TLSO using multimodal cues and teachback method, pt continuing to need assist to appropriately sequence steps with underarm straps. OT to continue to progress pt as able. DC plans remain appropriate for SNF at this time.   Recommendations for follow up therapy are one component of a multi-disciplinary discharge planning process, led by the attending physician.  Recommendations may be updated based on patient status, additional functional criteria and insurance authorization.    Assistance Recommended at Discharge Frequent or constant Supervision/Assistance  Patient can return home with the following  A little help with walking and/or transfers;A lot of help with bathing/dressing/bathroom;Assistance with cooking/housework;Direct supervision/assist for medications management;Direct supervision/assist for financial management;Assist for transportation;Help with stairs or ramp for entrance   Equipment Recommendations  Other (comment) (defer to next level of care)    Recommendations for Other Services      Precautions / Restrictions Precautions Precautions: Fall;Back Required Braces or Orthoses: Spinal Brace Spinal Brace: Thoracolumbosacral orthotic;Applied in sitting  position Restrictions Weight Bearing Restrictions: No       Mobility Bed Mobility               General bed mobility comments: Pt rec'd and left sitting in recliner    Transfers                   General transfer comment: deferred     Balance                                           ADL either performed or assessed with clinical judgement   ADL                   Upper Body Dressing : Moderate assistance;Cueing for sequencing Upper Body Dressing Details (indicate cue type and reason): To don/doff brace, needing repetition and cues for sequencing. Lower Body Dressing: Sitting/lateral leans;Set up;Supervision/safety;With adaptive equipment;Adhering to back precautions Lower Body Dressing Details (indicate cue type and reason): to doff/don socks with AE, min to mod cueing to learn steps               General ADL Comments: Pt left in recliner, mobility deferred to reinforce AE use and TLSO donning/doffing    Extremity/Trunk Assessment              Vision       Perception     Praxis      Cognition Arousal/Alertness: Awake/alert Behavior During Therapy: WFL for tasks assessed/performed Overall Cognitive Status: History of cognitive impairments - at baseline Area of Impairment: Problem solving, Memory, Safety/judgement                     Memory: Decreased recall of precautions   Safety/Judgement:  Decreased awareness of deficits   Problem Solving: Difficulty sequencing, Requires verbal cues, Requires tactile cues          Exercises      Shoulder Instructions       General Comments      Pertinent Vitals/ Pain       Pain Assessment Pain Assessment: Faces Faces Pain Scale: Hurts little more Pain Location: back Pain Descriptors / Indicators: Sore Pain Intervention(s): Repositioned, Monitored during session  Home Living                                          Prior  Functioning/Environment              Frequency  Min 2X/week        Progress Toward Goals  OT Goals(current goals can now be found in the care plan section)  Progress towards OT goals: Progressing toward goals  Acute Rehab OT Goals Patient Stated Goal: get better OT Goal Formulation: With patient Time For Goal Achievement: 01/27/23 Potential to Achieve Goals: Good  Plan Discharge plan remains appropriate;Frequency remains appropriate    Co-evaluation                 AM-PAC OT "6 Clicks" Daily Activity     Outcome Measure   Help from another person eating meals?: None Help from another person taking care of personal grooming?: A Little Help from another person toileting, which includes using toliet, bedpan, or urinal?: A Lot Help from another person bathing (including washing, rinsing, drying)?: A Lot Help from another person to put on and taking off regular upper body clothing?: A Lot Help from another person to put on and taking off regular lower body clothing?: A Little 6 Click Score: 16    End of Session Equipment Utilized During Treatment: Other (comment) (hip kit)  OT Visit Diagnosis: Unsteadiness on feet (R26.81);Muscle weakness (generalized) (M62.81);Other symptoms and signs involving cognitive function   Activity Tolerance Patient tolerated treatment well   Patient Left in chair;with call bell/phone within reach   Nurse Communication Mobility status        Time: 7829-5621 OT Time Calculation (min): 26 min  Charges: OT General Charges $OT Visit: 1 Visit OT Treatments $Therapeutic Activity: 23-37 mins  01/16/2023  AB, OTR/L  Acute Rehabilitation Services  Office: [phone]   Tristan Schroeder 01/16/2023, 4:33 PM

## 2023-01-16 NOTE — Care Management Important Message (Signed)
Important Message  Patient Details  Name: Kimberly [name] MRN: [id number] Date of Birth: 1941-06-25   Medicare Important Message Given:  Yes     Sherilyn Banker 01/16/2023, 2:09 PM

## 2023-01-16 NOTE — TOC Progression Note (Addendum)
Transition of Care [facility]) - Progression Note    Patient Details  Name: Kimberly [name] MRN: [id number] Date of Birth: 04-Nov-1940  Transition of Care [facility] CM/SW Contact  Lorri Frederick, LCSW Phone Number: 01/16/2023, 11:31 AM  Clinical Narrative:   [name] does offer semi private bed.  [facility] has offered and does have private room available.  CSW spoke with pt, who asked CSW to call daughter Kimberly [name].  Spoke to daughter and she will decide shortly, on the way to the hospital now.   1210: Spoke with pt and daughter in room, they would like to accept offer at [facility].  Kimberly [name] informed and can accept pt today.    Expected Discharge Plan: Skilled Nursing Facility Barriers to Discharge: Continued Medical Work up  Expected Discharge Plan and Services In-house Referral: Clinical Social Work   Post Acute Care Choice: Skilled Nursing Facility Living arrangements for the past 2 months: Apartment Expected Discharge Date: 01/14/23                                     Social Determinants of Health (SDOH) Interventions SDOH Screenings   Food Insecurity: No Food Insecurity (01/12/2023)  Housing: Low Risk  (01/12/2023)  Transportation Needs: No Transportation Needs (01/12/2023)  Utilities: Not At Risk (01/12/2023)  Depression (PHQ2-9): Low Risk  (12/28/2022)  Tobacco Use: Low Risk  (01/12/2023)    Readmission Risk Interventions     No data to display

## 2023-01-19 ENCOUNTER — Encounter (HOSPITAL_COMMUNITY): Payer: Self-pay | Admitting: Orthopedic Surgery

## 2023-01-25 DIAGNOSIS — M4316 Spondylolisthesis, lumbar region: Secondary | ICD-10-CM | POA: Diagnosis not present

## 2023-01-31 ENCOUNTER — Encounter: Payer: Self-pay | Admitting: Internal Medicine

## 2023-02-05 DIAGNOSIS — G8929 Other chronic pain: Secondary | ICD-10-CM | POA: Diagnosis not present

## 2023-02-05 DIAGNOSIS — F32A Depression, unspecified: Secondary | ICD-10-CM | POA: Diagnosis not present

## 2023-02-05 DIAGNOSIS — Z90710 Acquired absence of both cervix and uterus: Secondary | ICD-10-CM | POA: Diagnosis not present

## 2023-02-05 DIAGNOSIS — E7800 Pure hypercholesterolemia, unspecified: Secondary | ICD-10-CM | POA: Diagnosis not present

## 2023-02-05 DIAGNOSIS — M1711 Unilateral primary osteoarthritis, right knee: Secondary | ICD-10-CM | POA: Diagnosis not present

## 2023-02-05 DIAGNOSIS — G629 Polyneuropathy, unspecified: Secondary | ICD-10-CM | POA: Diagnosis not present

## 2023-02-05 DIAGNOSIS — M810 Age-related osteoporosis without current pathological fracture: Secondary | ICD-10-CM | POA: Diagnosis not present

## 2023-02-05 DIAGNOSIS — Z4789 Encounter for other orthopedic aftercare: Secondary | ICD-10-CM | POA: Diagnosis not present

## 2023-02-05 DIAGNOSIS — Z96652 Presence of left artificial knee joint: Secondary | ICD-10-CM | POA: Diagnosis not present

## 2023-02-05 DIAGNOSIS — M5416 Radiculopathy, lumbar region: Secondary | ICD-10-CM | POA: Diagnosis not present

## 2023-02-05 DIAGNOSIS — M1990 Unspecified osteoarthritis, unspecified site: Secondary | ICD-10-CM | POA: Diagnosis not present

## 2023-02-05 DIAGNOSIS — M160 Bilateral primary osteoarthritis of hip: Secondary | ICD-10-CM | POA: Diagnosis not present

## 2023-02-05 DIAGNOSIS — K219 Gastro-esophageal reflux disease without esophagitis: Secondary | ICD-10-CM | POA: Diagnosis not present

## 2023-02-05 DIAGNOSIS — K5900 Constipation, unspecified: Secondary | ICD-10-CM | POA: Diagnosis not present

## 2023-02-05 DIAGNOSIS — Z556 Problems related to health literacy: Secondary | ICD-10-CM | POA: Diagnosis not present

## 2023-02-05 DIAGNOSIS — G3184 Mild cognitive impairment, so stated: Secondary | ICD-10-CM | POA: Diagnosis not present

## 2023-02-05 DIAGNOSIS — I872 Venous insufficiency (chronic) (peripheral): Secondary | ICD-10-CM | POA: Diagnosis not present

## 2023-02-05 DIAGNOSIS — Z9181 History of falling: Secondary | ICD-10-CM | POA: Diagnosis not present

## 2023-02-05 DIAGNOSIS — G2581 Restless legs syndrome: Secondary | ICD-10-CM | POA: Diagnosis not present

## 2023-02-05 DIAGNOSIS — Z96612 Presence of left artificial shoulder joint: Secondary | ICD-10-CM | POA: Diagnosis not present

## 2023-02-05 DIAGNOSIS — Z981 Arthrodesis status: Secondary | ICD-10-CM | POA: Diagnosis not present

## 2023-02-05 DIAGNOSIS — I10 Essential (primary) hypertension: Secondary | ICD-10-CM | POA: Diagnosis not present

## 2023-02-05 DIAGNOSIS — Z791 Long term (current) use of non-steroidal anti-inflammatories (NSAID): Secondary | ICD-10-CM | POA: Diagnosis not present

## 2023-02-05 DIAGNOSIS — K5790 Diverticulosis of intestine, part unspecified, without perforation or abscess without bleeding: Secondary | ICD-10-CM | POA: Diagnosis not present

## 2023-02-05 DIAGNOSIS — M19011 Primary osteoarthritis, right shoulder: Secondary | ICD-10-CM | POA: Diagnosis not present

## 2023-02-08 DIAGNOSIS — G629 Polyneuropathy, unspecified: Secondary | ICD-10-CM | POA: Diagnosis not present

## 2023-02-08 DIAGNOSIS — M1990 Unspecified osteoarthritis, unspecified site: Secondary | ICD-10-CM | POA: Diagnosis not present

## 2023-02-08 DIAGNOSIS — I872 Venous insufficiency (chronic) (peripheral): Secondary | ICD-10-CM | POA: Diagnosis not present

## 2023-02-08 DIAGNOSIS — Z4789 Encounter for other orthopedic aftercare: Secondary | ICD-10-CM | POA: Diagnosis not present

## 2023-02-08 DIAGNOSIS — I10 Essential (primary) hypertension: Secondary | ICD-10-CM | POA: Diagnosis not present

## 2023-02-08 DIAGNOSIS — M5416 Radiculopathy, lumbar region: Secondary | ICD-10-CM | POA: Diagnosis not present

## 2023-02-13 DIAGNOSIS — I872 Venous insufficiency (chronic) (peripheral): Secondary | ICD-10-CM | POA: Diagnosis not present

## 2023-02-13 DIAGNOSIS — M5416 Radiculopathy, lumbar region: Secondary | ICD-10-CM | POA: Diagnosis not present

## 2023-02-13 DIAGNOSIS — I10 Essential (primary) hypertension: Secondary | ICD-10-CM | POA: Diagnosis not present

## 2023-02-13 DIAGNOSIS — Z4789 Encounter for other orthopedic aftercare: Secondary | ICD-10-CM | POA: Diagnosis not present

## 2023-02-13 DIAGNOSIS — G629 Polyneuropathy, unspecified: Secondary | ICD-10-CM | POA: Diagnosis not present

## 2023-02-13 DIAGNOSIS — M1990 Unspecified osteoarthritis, unspecified site: Secondary | ICD-10-CM | POA: Diagnosis not present

## 2023-02-14 ENCOUNTER — Ambulatory Visit (INDEPENDENT_AMBULATORY_CARE_PROVIDER_SITE_OTHER): Payer: Medicare Other | Admitting: Internal Medicine

## 2023-02-14 ENCOUNTER — Encounter: Payer: Self-pay | Admitting: Internal Medicine

## 2023-02-14 VITALS — BP 130/84 | HR 76 | Temp 98.4°F | Wt 162.2 lb

## 2023-02-14 DIAGNOSIS — E782 Mixed hyperlipidemia: Secondary | ICD-10-CM | POA: Diagnosis not present

## 2023-02-14 DIAGNOSIS — G3184 Mild cognitive impairment, so stated: Secondary | ICD-10-CM | POA: Diagnosis not present

## 2023-02-14 DIAGNOSIS — Z09 Encounter for follow-up examination after completed treatment for conditions other than malignant neoplasm: Secondary | ICD-10-CM

## 2023-02-14 DIAGNOSIS — E538 Deficiency of other specified B group vitamins: Secondary | ICD-10-CM | POA: Diagnosis not present

## 2023-02-14 DIAGNOSIS — I10 Essential (primary) hypertension: Secondary | ICD-10-CM | POA: Diagnosis not present

## 2023-02-14 DIAGNOSIS — M5416 Radiculopathy, lumbar region: Secondary | ICD-10-CM | POA: Diagnosis not present

## 2023-02-14 NOTE — Progress Notes (Signed)
Established Patient Office Visit     CC/Reason for Visit: Hospital follow-up  HPI: Kimberly [name] is a 82 y.o. female who is coming in today for the above mentioned reasons.  She was admitted to the hospital 5/16-5/20/2024 for a lumbar fusion due to herniated lumbar disc.  From there she was admitted to [facility] for couple weeks.  She is now back home.  She has been doing well.  Per daughter she had some significant hospital-acquired delirium that has since resolved.  Patient has no recollection of being in the hospital.  She is only taking muscle relaxers and pain medication sporadically.  When she does take a tramadol it is half of the prescribed amount.  She has had no pain since discharge.   Past Medical/Surgical History: Past Medical History:  Diagnosis Date   Arthritis    Complication of anesthesia    Pt reports that she was told "someone had to breath for her" She is not clear what this means   Difficult intubation    Diverticulitis    GERD (gastroesophageal reflux disease)    History of COVID-19 05/2020   Hypercholesteremia    Hypertension    Mild cognitive impairment    Peripheral neuropathy    Restless leg syndrome     Past Surgical History:  Procedure Laterality Date   ABDOMINAL HYSTERECTOMY     Age 78   BACK SURGERY  2004   BREAST BIOPSY Left    COLONOSCOPY WITH PROPOFOL N/A 11/13/2020   Procedure: COLONOSCOPY WITH PROPOFOL;  Surgeon: Jeani Hawking, MD;  Location: [facility];  Service: Endoscopy;  Laterality: N/A;   ESOPHAGOGASTRODUODENOSCOPY (EGD) WITH PROPOFOL N/A 11/13/2020   Procedure: ESOPHAGOGASTRODUODENOSCOPY (EGD) WITH PROPOFOL;  Surgeon: Jeani Hawking, MD;  Location: [facility];  Service: Endoscopy;  Laterality: N/A;   EYE SURGERY     bilateral cateract    POLYPECTOMY  11/13/2020   Procedure: POLYPECTOMY;  Surgeon: Jeani Hawking, MD;  Location: [facility];  Service: Endoscopy;;   REVERSE SHOULDER ARTHROPLASTY Left 04/07/2022    Procedure: REVERSE SHOULDER ARTHROPLASTY;  Surgeon: Jones Broom, MD;  Location: [facility];  Service: Orthopedics;  Laterality: Left;   TONSILLECTOMY     As a child   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 She has never used smokeless tobacco. She reports that she does not drink alcohol and does not use drugs.  Allergies: Allergies  Allergen Reactions   Amoxicillin Swelling    Throat felt swollen   Ciprofloxacin Itching   Adhesive [Tape] Itching    Redness   Benzoyl Peroxide Itching   Metronidazole Hives   Oxycodone Other (See Comments)    Felt disoriented   Betadine [Povidone Iodine] Rash   Hydrocodone Itching and Anxiety   Sulfa Antibiotics Itching and Rash    Family History:  Family History  Problem Relation Age of Onset   Breast cancer Sister    Other Mother        passed away at age 63   Heart  attack Father        passed away at age 6     Current Outpatient Medications:    acetaminophen (TYLENOL) 650 MG CR tablet, Take 650-1,250 mg by mouth See admin instructions. Take 1250mg  in the AM, 1250mg  at noon, and 650mg  at bedtime., Disp: , Rfl:    amLODipine (NORVASC) 2.5 MG tablet, Take 2.5 mg by mouth daily., Disp: , Rfl:    aspirin EC 81 MG tablet, Take 81 mg by mouth daily. Swallow whole., Disp: , Rfl:    diphenhydrAMINE (BENADRYL) 25 mg capsule, Take 1-2 capsules (25-50 mg total) by mouth every 6 (six) hours as needed for itching (Dose with px medications)., Disp: 30  capsule, Rfl: 0   docusate sodium (COLACE) 100 MG capsule, Take 1 capsule (100 mg total) by mouth 2 (two) times daily., Disp: 10 capsule, Rfl: 0   DULoxetine (CYMBALTA) 60 MG capsule, TAKE 1 CAPSULE BY MOUTH EVERY DAY, Disp: 90 capsule, Rfl: 3   gabapentin (NEURONTIN) 300 MG capsule, Take 300 mg by mouth 4 (four) times daily., Disp: , Rfl:    HYDROcodone-acetaminophen (NORCO/VICODIN) 5-325 MG tablet, Take 1-2 tablets by mouth every 4 (four) hours as needed for moderate pain ((score 4 to 6))., Disp: 30 tablet, Rfl: 0   methocarbamol (ROBAXIN) 500 MG tablet, Take 1-2 tablets (500-1,000 mg total) by mouth every 6 (six) hours as needed for muscle spasms., Disp: 60 tablet, Rfl: 0   omeprazole (PRILOSEC) 40 MG capsule, Take 40 mg by mouth in the morning., Disp: , Rfl:    ondansetron (ZOFRAN) 4 MG tablet, Take 1 tablet (4 mg total) by mouth every 6 (six) hours as needed for nausea or vomiting., Disp: 20 tablet, Rfl: 0   Polyethyl Glycol-Propyl Glycol (SYSTANE OP), Place 1 drop into both eyes every 6 (six) hours as needed (dry eyes)., Disp: , Rfl:    pramipexole (MIRAPEX) 0.25 MG tablet, Take 0.25 mg by mouth at bedtime., Disp: , Rfl:    simvastatin (ZOCOR) 40 MG tablet, Take 40 mg by mouth at bedtime. , Disp: , Rfl:    traMADol (ULTRAM) 50 MG tablet, Take 1 tablet (50 mg total) by mouth every 6 (six) hours as needed for moderate pain., Disp: 30 tablet, Rfl: 0   traZODone (DESYREL) 50 MG tablet, TAKE 2 TABLETS BY MOUTH AT BEDTIME. (Patient taking differently: Take 50 mg by mouth at bedtime.), Disp: 180 tablet, Rfl: 4   vitamin B-12 (CYANOCOBALAMIN) 500 MCG tablet, Take 500 mcg by mouth in the morning., Disp: , Rfl:   Review of Systems:  Negative unless indicated in HPI.   Physical Exam: Vitals:   02/14/23 1131  BP: 130/84  Pulse: 76  Temp: 98.4 F (36.9 C)  TempSrc: Oral  SpO2: 98%  Weight: 162 lb 3.2 oz (73.6 kg)    Body mass index is 29.67 kg/m.   Physical Exam Vitals reviewed.   Constitutional:      Appearance: Normal appearance.  HENT:     Head: Normocephalic and atraumatic.  Eyes:     Conjunctiva/sclera: Conjunctivae normal.     Pupils: Pupils are equal, round, and reactive to light.  Cardiovascular:     Rate and Rhythm: Normal rate and regular rhythm.  Pulmonary:     Effort: Pulmonary effort is normal.     Breath sounds: Normal breath sounds.  Skin:    General: Skin is warm and dry.  Neurological:     General: No focal deficit present.     Mental Status:  She is alert and oriented to person, place, and time.  Psychiatric:        Mood and Affect: Mood normal.        Behavior: Behavior normal.        Thought Content: Thought content normal.        Judgment: Judgment normal.      Impression and Plan:  Hospital discharge follow-up  Radiculopathy of lumbar region  Primary hypertension  Mixed hyperlipidemia  B12 deficiency  Mild cognitive impairment   -Hospital charts  have been reviewed in detail. No notes from SNF to review. -She is back to taking same meds as prior to admission. [facility] delirium has fully resolved. -Only taking half a tramadol sporadically for pain, muscle relaxers a little more frequently. -Has follow up with ortho next week.  Time spent:31 minutes reviewing chart, interviewing and examining patient and formulating plan of care.     Chaya Jan, MD [facility]

## 2023-02-16 DIAGNOSIS — M5416 Radiculopathy, lumbar region: Secondary | ICD-10-CM | POA: Diagnosis not present

## 2023-02-16 DIAGNOSIS — M1990 Unspecified osteoarthritis, unspecified site: Secondary | ICD-10-CM | POA: Diagnosis not present

## 2023-02-16 DIAGNOSIS — Z4789 Encounter for other orthopedic aftercare: Secondary | ICD-10-CM | POA: Diagnosis not present

## 2023-02-16 DIAGNOSIS — I10 Essential (primary) hypertension: Secondary | ICD-10-CM | POA: Diagnosis not present

## 2023-02-16 DIAGNOSIS — G629 Polyneuropathy, unspecified: Secondary | ICD-10-CM | POA: Diagnosis not present

## 2023-02-16 DIAGNOSIS — I872 Venous insufficiency (chronic) (peripheral): Secondary | ICD-10-CM | POA: Diagnosis not present

## 2023-02-17 DIAGNOSIS — I872 Venous insufficiency (chronic) (peripheral): Secondary | ICD-10-CM | POA: Diagnosis not present

## 2023-02-17 DIAGNOSIS — M5416 Radiculopathy, lumbar region: Secondary | ICD-10-CM | POA: Diagnosis not present

## 2023-02-17 DIAGNOSIS — G629 Polyneuropathy, unspecified: Secondary | ICD-10-CM | POA: Diagnosis not present

## 2023-02-17 DIAGNOSIS — Z4789 Encounter for other orthopedic aftercare: Secondary | ICD-10-CM | POA: Diagnosis not present

## 2023-02-17 DIAGNOSIS — I10 Essential (primary) hypertension: Secondary | ICD-10-CM | POA: Diagnosis not present

## 2023-02-17 DIAGNOSIS — M1990 Unspecified osteoarthritis, unspecified site: Secondary | ICD-10-CM | POA: Diagnosis not present

## 2023-02-22 DIAGNOSIS — M19011 Primary osteoarthritis, right shoulder: Secondary | ICD-10-CM | POA: Diagnosis not present

## 2023-02-23 ENCOUNTER — Ambulatory Visit (INDEPENDENT_AMBULATORY_CARE_PROVIDER_SITE_OTHER): Payer: Medicare Other | Admitting: Family Medicine

## 2023-02-23 ENCOUNTER — Encounter: Payer: Self-pay | Admitting: Family Medicine

## 2023-02-23 VITALS — BP 158/80 | HR 88 | Temp 98.7°F | Wt 166.0 lb

## 2023-02-23 DIAGNOSIS — B029 Zoster without complications: Secondary | ICD-10-CM | POA: Diagnosis not present

## 2023-02-23 MED ORDER — VALACYCLOVIR HCL 1 G PO TABS
1000.0000 mg | ORAL_TABLET | Freq: Two times a day (BID) | ORAL | 0 refills | Status: AC
Start: 2023-02-23 — End: 2023-03-02

## 2023-02-23 NOTE — Progress Notes (Signed)
   Established Patient Office Visit   Subjective  Patient ID: Kimberly [name], female    DOB: 04-Aug-1941  Age: 82 y.o. MRN: [id number]  Chief Complaint  Patient presents with   Ear Pain    Started 3 days ago with pain and discomfort in left ear, has since gotten an breakout on neck and back of head on left side and neck. Also having ringing in left ear.   Patient accompanied by her daughter.  Patient is an 82 year old female followed with Dr. Ardyth Harps and seen for acute concern.  Patient endorses left ear pain/discomfort x 2-3 days.  Patient states she then developed a rash on the forehead and back of her neck.  Patient notes itching/irritation inside of the ear and mild burning of rash.  Patient denies changes in hearing.  Has noticed tinnitus.  Denies changes in vision.  Taking gabapentin daily.  Patient had a shingles vaccine in the past.      ROS Negative unless stated above    Objective:     BP (!) 158/80 (BP Location: Right Arm, Patient Position: Sitting, Cuff Size: Normal)   Pulse 88   Temp 98.7 F (37.1 C) (Oral)   Wt 166 lb (75.3 kg)   SpO2 98%   BMI 30.36 kg/m    Physical Exam Constitutional:      Appearance: Normal appearance.  Eyes:     Extraocular Movements: Extraocular movements intact.     Conjunctiva/sclera: Conjunctivae normal.  Cardiovascular:     Rate and Rhythm: Normal rate.     Pulses: Normal pulses.  Pulmonary:     Effort: Pulmonary effort is normal.     Breath sounds: Normal breath sounds.  Skin:    General: Skin is warm and dry.     Findings: Erythema and rash present. Rash is crusting.          Comments: Dried lesions on erythematous base on left ear, anterior left lateral neck, and posterior left neck at nuchal ridge, occipital area, to medial left shoulder/base of neck.  No lesions or erythema on face.  Neurological:     Mental Status: She is alert and oriented to person, place, and time.     Comments: Ambulating with walker.     No results found for any visits on 02/23/23.    Assessment & Plan:  Herpes zoster without complication -     valACYclovir HCl; Take 1 tablet (1,000 mg total) by mouth 2 (two) times daily for 7 days.  Dispense: 14 tablet; Refill: 0  New problem.  Dried shingles rash noted on left ear, and left side of anterior and posterior neck.  No facial involvement.  As lesions appear mostly dry given option of antiviral medication.  Patient wishes to start.  Continue regular gabapentin dose.  Given precautions.  Return if symptoms worsen or fail to improve.   Deeann Saint, MD

## 2023-03-06 ENCOUNTER — Encounter: Payer: Self-pay | Admitting: Internal Medicine

## 2023-03-16 ENCOUNTER — Encounter: Payer: Self-pay | Admitting: Internal Medicine

## 2023-03-16 ENCOUNTER — Ambulatory Visit (INDEPENDENT_AMBULATORY_CARE_PROVIDER_SITE_OTHER): Payer: Medicare Other | Admitting: Internal Medicine

## 2023-03-16 VITALS — BP 130/80 | HR 105 | Temp 98.1°F | Wt 153.3 lb

## 2023-03-16 DIAGNOSIS — B0229 Other postherpetic nervous system involvement: Secondary | ICD-10-CM

## 2023-03-16 NOTE — Patient Instructions (Signed)
Your gabapentin tablets should be 300 mg.  If you are taking 1 tablet 3 times a day, okay to increase to 2 tablets 3 times a day.  However, if you are taking 2 tablets 3 times a day please add an additional tablet to 2 of these doses.

## 2023-03-16 NOTE — Progress Notes (Signed)
Established Patient Office Visit     CC/Reason for Visit: Pain after shingles  HPI: Kimberly [name] is a 82 y.o. female who is coming in today for the above mentioned reasons.  She was diagnosed with shingles on 6/27 around the left side of her neck and face.  She was treated with Valtrex and prednisone.  Rash has resolved however she continues to have lightening/burning type pain around her jaw and neck and ear.   Past Medical/Surgical History: Past Medical History:  Diagnosis Date   Arthritis    Complication of anesthesia    Pt reports that she was told "someone had to breath for her" She is not clear what this means   Difficult intubation    Diverticulitis    GERD (gastroesophageal reflux disease)    History of COVID-19 05/2020   Hypercholesteremia    Hypertension    Mild cognitive impairment    Peripheral neuropathy    Restless leg syndrome     Past Surgical History:  Procedure Laterality Date   ABDOMINAL HYSTERECTOMY     Age 17   BACK SURGERY  2004   BREAST BIOPSY Left    COLONOSCOPY WITH PROPOFOL N/A 11/13/2020   Procedure: COLONOSCOPY WITH PROPOFOL;  Surgeon: Jeani Hawking, MD;  [facility]: [facility];  Service: Endoscopy;  Laterality: N/A;   ESOPHAGOGASTRODUODENOSCOPY (EGD) WITH PROPOFOL N/A 11/13/2020   Procedure: ESOPHAGOGASTRODUODENOSCOPY (EGD) WITH PROPOFOL;  Surgeon: Jeani Hawking, MD;  [facility];  Service: Endoscopy;  Laterality: N/A;   EYE SURGERY     bilateral cateract    POLYPECTOMY  11/13/2020   Procedure: POLYPECTOMY;  Surgeon: Jeani Hawking, MD;  [facility];  Service: Endoscopy;;   REVERSE SHOULDER ARTHROPLASTY Left 04/07/2022   Procedure: REVERSE SHOULDER ARTHROPLASTY;  Surgeon: Jones Broom, MD;  [facility]: [facility];  Service: Orthopedics;  Laterality: Left;   TONSILLECTOMY     As a child   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   Procedure: TOTAL KNEE ARTHROPLASTY;  Surgeon: Durene Romans, MD;  [facility]: [facility];  Service: Orthopedics;  Laterality: Left;  70 mins   TRANSFORAMINAL LUMBAR INTERBODY FUSION (TLIF) WITH PEDICLE SCREW FIXATION 1 LEVEL Right 01/12/2023   Procedure: RIGHT-SIDED TRSANSFORAMINAL LUMBAR INTERBODY FUSION LUMBAR 4 - LUMBAR 5  WITH INSTRUMENTATION AND ALLOGRAFT;  Surgeon: Estill Bamberg, MD;  Location: [facility];  Service: Orthopedics;  Laterality: Right;    Social History:  reports that she has never smoked. She has never used smokeless tobacco. She reports that she does not drink alcohol and does not use drugs.  Allergies: Allergies  Allergen Reactions   Amoxicillin Swelling    Throat felt swollen   Ciprofloxacin Itching   Adhesive [Tape] Itching    Redness   Benzoyl Peroxide Itching   Metronidazole Hives   Oxycodone Other (See Comments)    Felt disoriented   Betadine [Povidone Iodine] Rash   Hydrocodone Itching and Anxiety   Sulfa Antibiotics Itching and Rash    Family History:  Family History  Problem Relation Age of Onset   Breast cancer Sister    Other Mother        passed away at age 26   Heart attack Father        passed away at age 69     Current Outpatient Medications:    acetaminophen (TYLENOL) 650 MG CR tablet, Take 650-1,250 mg by mouth See admin instructions. Take 1250mg  in the AM, 1250mg  at noon, and 650mg  at bedtime., Disp: ,  Rfl:    amLODipine (NORVASC) 2.5 MG tablet, Take 2.5 mg by mouth daily., Disp: , Rfl:    aspirin EC 81 MG tablet, Take 81 mg by mouth daily. Swallow whole., Disp: , Rfl:    diphenhydrAMINE (BENADRYL) 25 mg capsule, Take 1-2 capsules (25-50 mg total) by mouth every 6 (six) hours as needed for itching (Dose with px medications)., Disp: 30 capsule, Rfl: 0   docusate sodium (COLACE) 100 MG capsule, Take 1 capsule (100 mg total) by mouth 2 (two) times daily., Disp: 10 capsule, Rfl: 0   DULoxetine (CYMBALTA) 60 MG capsule, TAKE 1 CAPSULE BY MOUTH EVERY DAY, Disp: 90 capsule, Rfl: 3   gabapentin (NEURONTIN) 300 MG capsule, Take 300  mg by mouth 4 (four) times daily., Disp: , Rfl:    HYDROcodone-acetaminophen (NORCO/VICODIN) 5-325 MG tablet, Take 1-2 tablets by mouth every 4 (four) hours as needed for moderate pain ((score 4 to 6))., Disp: 30 tablet, Rfl: 0   methocarbamol (ROBAXIN) 500 MG tablet, Take 1-2 tablets (500-1,000 mg total) by mouth every 6 (six) hours as needed for muscle spasms., Disp: 60 tablet, Rfl: 0   omeprazole (PRILOSEC) 40 MG capsule, Take 40 mg by mouth in the morning., Disp: , Rfl:    ondansetron (ZOFRAN) 4 MG tablet, Take 1 tablet (4 mg total) by mouth every 6 (six) hours as needed for nausea or vomiting., Disp: 20 tablet, Rfl: 0   Polyethyl Glycol-Propyl Glycol (SYSTANE OP), Place 1 drop into both eyes every 6 (six) hours as needed (dry eyes)., Disp: , Rfl:    pramipexole (MIRAPEX) 0.25 MG tablet, Take 0.25 mg by mouth at bedtime., Disp: , Rfl:    simvastatin (ZOCOR) 40 MG tablet, Take 40 mg by mouth at bedtime. , Disp: , Rfl:    traMADol (ULTRAM) 50 MG tablet, Take 1 tablet (50 mg total) by mouth every 6 (six) hours as needed for moderate pain., Disp: 30 tablet, Rfl: 0   traZODone (DESYREL) 50 MG tablet, TAKE 2 TABLETS BY MOUTH AT BEDTIME. (Patient taking differently: Take 50 mg by mouth at bedtime.), Disp: 180 tablet, Rfl: 4   vitamin B-12 (CYANOCOBALAMIN) 500 MCG tablet, Take 500 mcg by mouth in the morning., Disp: , Rfl:   Review of Systems:  Negative unless indicated in HPI.   Physical Exam: Vitals:   03/16/23 1601  BP: 130/80  Pulse: (!) 105  Temp: 98.1 F (36.7 C)  TempSrc: Oral  SpO2: 95%  Weight: 153 lb 4.8 oz (69.5 kg)    Body mass index is 28.04 kg/m.   Physical Exam Vitals reviewed.  Constitutional:      Appearance: Normal appearance.  HENT:     Head: Normocephalic and atraumatic.  Eyes:     Conjunctiva/sclera: Conjunctivae normal.     Pupils: Pupils are equal, round, and reactive to light.  Cardiovascular:     Rate and Rhythm: Normal rate and regular rhythm.   Pulmonary:     Effort: Pulmonary effort is normal.     Breath sounds: Normal breath sounds.  Skin:    General: Skin is warm and dry.  Neurological:     General: No focal deficit present.     Mental Status: She is alert and oriented to person, place, and time.  Psychiatric:        Mood and Affect: Mood normal.        Behavior: Behavior normal.        Thought Content: Thought content normal.  Judgment: Judgment normal.      Impression and Plan:  Post herpetic neuralgia  -She is already on gabapentin, will double up on dose.   Time spent:22 minutes reviewing chart, interviewing and examining patient and formulating plan of care.     Chaya Jan, MD [facility] at [facility]

## 2023-03-24 ENCOUNTER — Other Ambulatory Visit: Payer: Self-pay | Admitting: *Deleted

## 2023-03-24 DIAGNOSIS — R413 Other amnesia: Secondary | ICD-10-CM

## 2023-03-24 DIAGNOSIS — G3184 Mild cognitive impairment, so stated: Secondary | ICD-10-CM

## 2023-03-24 DIAGNOSIS — M4316 Spondylolisthesis, lumbar region: Secondary | ICD-10-CM | POA: Diagnosis not present

## 2023-04-14 ENCOUNTER — Encounter: Payer: Self-pay | Admitting: Internal Medicine

## 2023-04-14 DIAGNOSIS — R413 Other amnesia: Secondary | ICD-10-CM

## 2023-05-29 DIAGNOSIS — M19211 Secondary osteoarthritis, right shoulder: Secondary | ICD-10-CM | POA: Diagnosis not present

## 2023-05-31 ENCOUNTER — Telehealth: Payer: Self-pay | Admitting: Internal Medicine

## 2023-05-31 NOTE — Telephone Encounter (Signed)
ERROR PLEASE DISREGARD

## 2023-06-06 ENCOUNTER — Telehealth: Payer: Self-pay

## 2023-06-06 DIAGNOSIS — H1132 Conjunctival hemorrhage, left eye: Secondary | ICD-10-CM | POA: Diagnosis not present

## 2023-06-06 NOTE — Telephone Encounter (Signed)
Pre- op paperwork has been filled out and faxed to [facility] orthopaedics

## 2023-06-12 ENCOUNTER — Other Ambulatory Visit: Payer: Self-pay | Admitting: Orthopedic Surgery

## 2023-06-13 ENCOUNTER — Telehealth: Payer: Self-pay | Admitting: Internal Medicine

## 2023-06-13 DIAGNOSIS — G3184 Mild cognitive impairment, so stated: Secondary | ICD-10-CM

## 2023-06-13 NOTE — Telephone Encounter (Signed)
Pt's daughter asking that referral [phone] be reissued as it has been closed. Pt daughter asks that all calls be made to her as her mother has cognitive issues, pt'd daughter requesting a call regarding her mother's condition  Okey Regal [phone]

## 2023-06-13 NOTE — Telephone Encounter (Signed)
Okay to place the referral?  °

## 2023-06-14 NOTE — Telephone Encounter (Signed)
Referral placed.

## 2023-06-14 NOTE — Telephone Encounter (Signed)
Pt's daughter says the facility is asking that referral [phone] be reissued because it has expired.

## 2023-06-14 NOTE — Telephone Encounter (Signed)
The referral was for Psychiatric for memory loss.

## 2023-06-14 NOTE — Addendum Note (Signed)
Addended by: Kern Reap B on: 06/14/2023 01:33 PM   Modules accepted: Orders

## 2023-06-15 ENCOUNTER — Ambulatory Visit: Payer: Medicare Other | Admitting: Internal Medicine

## 2023-06-15 ENCOUNTER — Other Ambulatory Visit: Payer: Self-pay | Admitting: Internal Medicine

## 2023-06-15 DIAGNOSIS — R413 Other amnesia: Secondary | ICD-10-CM

## 2023-06-20 ENCOUNTER — Institutional Professional Consult (permissible substitution): Payer: Medicare Other | Admitting: Neurology

## 2023-06-20 ENCOUNTER — Other Ambulatory Visit: Payer: Self-pay | Admitting: Internal Medicine

## 2023-06-20 DIAGNOSIS — G3184 Mild cognitive impairment, so stated: Secondary | ICD-10-CM

## 2023-06-20 DIAGNOSIS — R413 Other amnesia: Secondary | ICD-10-CM

## 2023-06-20 NOTE — Progress Notes (Signed)
Anesthesia Review:  PCP: Philip Aspen LOV 03/16/23  Cardiologist : Chest x-ray : EKG : Echo : Stress test: Cardiac Cath :  Activity level:  Sleep Study/ CPAP : Fasting Blood Sugar :      / Checks Blood Sugar -- times a day:   Blood Thinner/ Instructions /Last Dose: ASA / Instructions/ Last Dose :    81 mg aspirin

## 2023-06-23 NOTE — Progress Notes (Signed)
Anesthesia Review:  PCP: Philip Aspen LOV 03/16/23  Cardiologist : Chest x-ray : EKG : Echo : Stress test: Cardiac Cath :  Activity level:  Sleep Study/ CPAP : Fasting Blood Sugar :      / Checks Blood Sugar -- times a day:   Blood Thinner/ Instructions /Last Dose: ASA / Instructions/ Last Dose :    81 mg aspirin

## 2023-06-23 NOTE — Patient Instructions (Signed)
SURGICAL WAITING ROOM VISITATION  Patients having surgery or a procedure may have no more than 2 support people in the waiting area - these visitors may rotate.    Children under the age of 70 must have an adult with them who is not the patient.  Due to an increase in RSV and influenza rates and associated hospitalizations, children ages 15 and under may not visit patients in [facility] hospitals.  If the patient needs to stay at the hospital during part of their recovery, the visitor guidelines for inpatient rooms apply. Pre-op nurse will coordinate an appropriate time for 1 support person to accompany patient in pre-op.  This support person may not rotate.    Please refer to the [facility] website for the visitor guidelines for Inpatients (after your surgery is over and you are in a regular room).       Your procedure is scheduled on:  11//2024    Report to [facility] Main Entrance    Report to admitting at    0700AM   Call this number if you have problems the morning of surgery [phone]   Do not eat food :After Midnight.   After Midnight you may have the following liquids until _ 0630_____ AM  DAY OF SURGERY  Water Non-Citrus Juices (without pulp, NO RED-Apple, White grape, White cranberry) Black Coffee (NO MILK/CREAM OR CREAMERS, sugar ok)  Clear Tea (NO MILK/CREAM OR CREAMERS, sugar ok) regular and decaf                             Plain Jell-O (NO RED)                                           Fruit ices (not with fruit pulp, NO RED)                                     Popsicles (NO RED)                                                               Sports drinks like Gatorade (NO RED)                    The day of surgery:  Drink ONE (1) Pre-Surgery Clear Ensure or G2 at  0630AM  ( have completed by ) the morning of surgery. Drink in one sitting. Do not sip.  This drink was given to you during your hospital  pre-op appointment visit. Nothing else to  drink after completing the  Pre-Surgery Clear Ensure or G2.          If you have questions, please contact your surgeon's office.       Oral Hygiene is also important to reduce your risk of infection.                                    Remember - BRUSH YOUR TEETH THE MORNING OF SURGERY  WITH YOUR REGULAR TOOTHPASTE  DENTURES WILL BE REMOVED PRIOR TO SURGERY PLEASE DO NOT APPLY "Poly grip" OR ADHESIVES!!!   Do NOT smoke after Midnight   Stop all vitamins and herbal supplements 7 days before surgery.   Take these medicines the morning of surgery with A SIP OF WATER:  amlodipine, cymbalta, gabapentin, omeprazole   DO NOT TAKE ANY ORAL DIABETIC MEDICATIONS DAY OF YOUR SURGERY  Bring CPAP mask and tubing day of surgery.                              You may not have any metal on your body including hair pins, jewelry, and body piercing             Do not wear make-up, lotions, powders, perfumes/cologne, or deodorant  Do not wear nail polish including gel and S&S, artificial/acrylic nails, or any other type of covering on natural nails including finger and toenails. If you have artificial nails, gel coating, etc. that needs to be removed by a nail salon please have this removed prior to surgery or surgery may need to be canceled/ delayed if the surgeon/ anesthesia feels like they are unable to be safely monitored.   Do not shave  48 hours prior to surgery.               Men may shave face and neck.   Do not bring valuables to the hospital. [place] IS NOT             RESPONSIBLE   FOR VALUABLES.   Contacts, glasses, dentures or bridgework may not be worn into surgery.   Bring small overnight bag day of surgery.   DO NOT BRING YOUR HOME MEDICATIONS TO THE HOSPITAL. PHARMACY WILL DISPENSE MEDICATIONS LISTED ON YOUR MEDICATION LIST TO YOU DURING YOUR ADMISSION IN THE HOSPITAL!    Patients discharged on the day of surgery will not be allowed to drive home.  Someone NEEDS to stay  with you for the first 24 hours after anesthesia.   Special Instructions: Bring a copy of your healthcare power of attorney and living will documents the day of surgery if you haven't scanned them before.              Please read over the following fact sheets you were given: IF YOU HAVE QUESTIONS ABOUT YOUR PRE-OP INSTRUCTIONS PLEASE CALL [phone]   If you received a COVID test during your pre-op visit  it is requested that you wear a mask when out in public, stay away from anyone that may not be feeling well and notify your surgeon if you develop symptoms. If you test positive for Covid or have been in contact with anyone that has tested positive in the last 10 days please notify you surgeon.      Pre-operative 5 CHG Bath Instructions   You can play a key role in reducing the risk of infection after surgery. Your skin needs to be as free of germs as possible. You can reduce the number of germs on your skin by washing with CHG (chlorhexidine gluconate) soap before surgery. CHG is an antiseptic soap that kills germs and continues to kill germs even after washing.   DO NOT use if you have an allergy to chlorhexidine/CHG or antibacterial soaps. If your skin becomes reddened or irritated, stop using the CHG and notify one of our RNs at [phone].   Please shower with  the CHG soap starting 4 days before surgery using the following schedule:     Please keep in mind the following:  DO NOT shave, including legs and underarms, starting the day of your first shower.   You may shave your face at any point before/day of surgery.  Place clean sheets on your bed the day you start using CHG soap. Use a clean washcloth (not used since being washed) for each shower. DO NOT sleep with pets once you start using the CHG.   CHG Shower Instructions:  If you choose to wash your hair and private area, wash first with your normal shampoo/soap.  After you use shampoo/soap, rinse your hair and body  thoroughly to remove shampoo/soap residue.  Turn the water OFF and apply about 3 tablespoons (45 ml) of CHG soap to a CLEAN washcloth.  Apply CHG soap ONLY FROM YOUR NECK DOWN TO YOUR TOES (washing for 3-5 minutes)  DO NOT use CHG soap on face, private areas, open wounds, or sores.  Pay special attention to the area where your surgery is being performed.  If you are having back surgery, having someone wash your back for you may be helpful. Wait 2 minutes after CHG soap is applied, then you may rinse off the CHG soap.  Pat dry with a clean towel  Put on clean clothes/pajamas   If you choose to wear lotion, please use ONLY the CHG-compatible lotions on the back of this paper.     Additional instructions for the day of surgery: DO NOT APPLY any lotions, deodorants, cologne, or perfumes.   Put on clean/comfortable clothes.  Brush your teeth.  Ask your nurse before applying any prescription medications to the skin.      CHG Compatible Lotions   Aveeno Moisturizing lotion  Cetaphil Moisturizing Cream  Cetaphil Moisturizing Lotion  Clairol Herbal Essence Moisturizing Lotion, Dry Skin  Clairol Herbal Essence Moisturizing Lotion, Extra Dry Skin  Clairol Herbal Essence Moisturizing Lotion, Normal Skin  Curel Age Defying Therapeutic Moisturizing Lotion with Alpha Hydroxy  Curel Extreme Care Body Lotion  Curel Soothing Hands Moisturizing Hand Lotion  Curel Therapeutic Moisturizing Cream, Fragrance-Free  Curel Therapeutic Moisturizing Lotion, Fragrance-Free  Curel Therapeutic Moisturizing Lotion, Original Formula  Eucerin Daily Replenishing Lotion  Eucerin Dry Skin Therapy Plus Alpha Hydroxy Crme  Eucerin Dry Skin Therapy Plus Alpha Hydroxy Lotion  Eucerin Original Crme  Eucerin Original Lotion  Eucerin Plus Crme Eucerin Plus Lotion  Eucerin TriLipid Replenishing Lotion  Keri Anti-Bacterial Hand Lotion  Keri Deep Conditioning Original Lotion Dry Skin Formula Softly Scented  Keri  Deep Conditioning Original Lotion, Fragrance Free Sensitive Skin Formula  Keri Lotion Fast Absorbing Fragrance Free Sensitive Skin Formula  Keri Lotion Fast Absorbing Softly Scented Dry Skin Formula  Keri Original Lotion  Keri Skin Renewal Lotion Keri Silky Smooth Lotion  Keri Silky Smooth Sensitive Skin Lotion  Nivea Body Creamy Conditioning Oil  Nivea Body Extra Enriched Lotion  Nivea Body Original Lotion  Nivea Body Sheer Moisturizing Lotion Nivea Crme  Nivea Skin Firming Lotion  NutraDerm 30 Skin Lotion  NutraDerm Skin Lotion  NutraDerm Therapeutic Skin Cream  NutraDerm Therapeutic Skin Lotion  ProShield Protective Hand Cream  Provon moisturizing lotion   [place]- Preparing for Total Shoulder Arthroplasty    Before surgery, you can play an important role. Because skin is not sterile, your skin needs to be as free of germs as possible. You can reduce the number of germs on your skin by using the  following products. Benzoyl Peroxide Gel Reduces the number of germs present on the skin Applied twice a day to shoulder area starting two days before surgery    ==================================================================  Please follow these instructions carefully:  BENZOYL PEROXIDE 5% GEL  Please do not use if you have an allergy to benzoyl peroxide.   If your skin becomes reddened/irritated stop using the benzoyl peroxide.  Starting two days before surgery, apply as follows: Apply benzoyl peroxide in the morning and at night. Apply after taking a shower. If you are not taking a shower clean entire shoulder front, back, and side along with the armpit with a clean wet washcloth.  Place a quarter-sized dollop on your shoulder and rub in thoroughly, making sure to cover the front, back, and side of your shoulder, along with the armpit.   2 days before ____ AM   ____ PM              1 day before ____ AM   ____ PM                         Do this twice a day for two days.   (Last application is the night before surgery, AFTER using the CHG soap as described below).  Do NOT apply benzoyl peroxide gel on the day of surgery.

## 2023-06-27 ENCOUNTER — Encounter (HOSPITAL_COMMUNITY)
Admission: RE | Admit: 2023-06-27 | Discharge: 2023-06-27 | Disposition: A | Discharge status: Home / Self Care | Payer: Medicare Other | Source: Ambulatory Visit | Attending: Orthopedic Surgery

## 2023-06-27 ENCOUNTER — Encounter (HOSPITAL_COMMUNITY): Payer: Self-pay

## 2023-06-27 ENCOUNTER — Other Ambulatory Visit: Payer: Self-pay

## 2023-06-27 ENCOUNTER — Ambulatory Visit (HOSPITAL_COMMUNITY)
Admission: RE | Admit: 2023-06-27 | Discharge: 2023-06-27 | Disposition: A | Discharge status: Home / Self Care | Payer: Medicare Other | Source: Ambulatory Visit | Attending: Orthopedic Surgery | Admitting: Orthopedic Surgery

## 2023-06-27 VITALS — BP 155/74 | HR 75 | Temp 98.8°F | Resp 16 | Ht 63.0 in | Wt 159.0 lb

## 2023-06-27 DIAGNOSIS — Z01818 Encounter for other preprocedural examination: Secondary | ICD-10-CM | POA: Insufficient documentation

## 2023-06-27 DIAGNOSIS — Z96612 Presence of left artificial shoulder joint: Secondary | ICD-10-CM | POA: Diagnosis not present

## 2023-06-27 DIAGNOSIS — J984 Other disorders of lung: Secondary | ICD-10-CM | POA: Diagnosis not present

## 2023-06-27 DIAGNOSIS — I700 Atherosclerosis of aorta: Secondary | ICD-10-CM | POA: Diagnosis not present

## 2023-06-27 LAB — CBC
HCT: 37.4 % (ref 36.0–46.0)
Hemoglobin: 11.6 g/dL — ABNORMAL LOW (ref 12.0–15.0)
MCH: 28.4 pg (ref 26.0–34.0)
MCHC: 31 g/dL (ref 30.0–36.0)
MCV: 91.4 fL (ref 80.0–100.0)
Platelets: 305 10*3/uL (ref 150–400)
RBC: 4.09 MIL/uL (ref 3.87–5.11)
RDW: 15.5 % (ref 11.5–15.5)
WBC: 8 10*3/uL (ref 4.0–10.5)
nRBC: 0 % (ref 0.0–0.2)

## 2023-06-27 LAB — BASIC METABOLIC PANEL
Anion gap: 8 (ref 5–15)
BUN: 14 mg/dL (ref 8–23)
CO2: 25 mmol/L (ref 22–32)
Calcium: 9.1 mg/dL (ref 8.9–10.3)
Chloride: 105 mmol/L (ref 98–111)
Creatinine, Ser: 0.73 mg/dL (ref 0.44–1.00)
GFR, Estimated: >60 mL/min (ref >60)
Glucose, Bld: 129 mg/dL — ABNORMAL HIGH (ref 70–99)
Potassium: 4.2 mmol/L (ref 3.5–5.1)
Sodium: 138 mmol/L (ref 135–145)

## 2023-06-27 LAB — SURGICAL PCR SCREEN
MRSA, PCR: NEGATIVE
Staphylococcus aureus: NEGATIVE

## 2023-06-27 NOTE — Care Plan (Signed)
Ortho Bundle Case Management Note  Patient Details  Name: Kimberly [name] MRN: [id number] Date of Birth: [birth year]  Will discharge to home with family to assist. No DME needed. OPPT set up with [facility] [address]. Patient and MD in agreement with plan. Choice offered                     DME Arranged:    DME Agency:     HH Arranged:    HH Agency:     Additional Comments: Please contact me with any questions of if this plan should need to change.  Shauna Hugh,  RN,BSN,MHA,CCM  [facility]  [phone] 06/27/2023, 3:50 PM

## 2023-07-04 ENCOUNTER — Ambulatory Visit

## 2023-07-06 ENCOUNTER — Other Ambulatory Visit: Payer: Self-pay

## 2023-07-06 ENCOUNTER — Encounter (HOSPITAL_COMMUNITY): Payer: Self-pay | Admitting: Orthopedic Surgery

## 2023-07-06 ENCOUNTER — Ambulatory Visit (HOSPITAL_COMMUNITY): Payer: Medicare Other

## 2023-07-06 ENCOUNTER — Encounter (HOSPITAL_COMMUNITY)
Admission: RE | Disposition: A | Discharge status: Home Health | Payer: Self-pay | Source: Home / Self Care | Attending: Orthopedic Surgery

## 2023-07-06 ENCOUNTER — Inpatient Hospital Stay (HOSPITAL_COMMUNITY)
Admission: RE | Admit: 2023-07-06 | Discharge: 2023-07-10 | DRG: 483 | Disposition: A | Discharge status: Home Health | Payer: Medicare Other | Attending: Orthopedic Surgery | Admitting: Orthopedic Surgery

## 2023-07-06 ENCOUNTER — Ambulatory Visit (HOSPITAL_COMMUNITY): Payer: Medicare Other | Admitting: Physician Assistant

## 2023-07-06 DIAGNOSIS — Z79899 Other long term (current) drug therapy: Secondary | ICD-10-CM

## 2023-07-06 DIAGNOSIS — Z880 Allergy status to penicillin: Secondary | ICD-10-CM

## 2023-07-06 DIAGNOSIS — Z791 Long term (current) use of non-steroidal anti-inflammatories (NSAID): Secondary | ICD-10-CM

## 2023-07-06 DIAGNOSIS — Z01818 Encounter for other preprocedural examination: Secondary | ICD-10-CM

## 2023-07-06 DIAGNOSIS — Z8616 Personal history of COVID-19: Secondary | ICD-10-CM | POA: Diagnosis not present

## 2023-07-06 DIAGNOSIS — Z881 Allergy status to other antibiotic agents status: Secondary | ICD-10-CM | POA: Diagnosis not present

## 2023-07-06 DIAGNOSIS — Z90710 Acquired absence of both cervix and uterus: Secondary | ICD-10-CM | POA: Diagnosis not present

## 2023-07-06 DIAGNOSIS — G2581 Restless legs syndrome: Secondary | ICD-10-CM | POA: Diagnosis present

## 2023-07-06 DIAGNOSIS — R7303 Prediabetes: Secondary | ICD-10-CM | POA: Diagnosis present

## 2023-07-06 DIAGNOSIS — I10 Essential (primary) hypertension: Secondary | ICD-10-CM | POA: Diagnosis present

## 2023-07-06 DIAGNOSIS — G629 Polyneuropathy, unspecified: Secondary | ICD-10-CM | POA: Diagnosis present

## 2023-07-06 DIAGNOSIS — Z96652 Presence of left artificial knee joint: Secondary | ICD-10-CM | POA: Diagnosis present

## 2023-07-06 DIAGNOSIS — E7800 Pure hypercholesterolemia, unspecified: Secondary | ICD-10-CM | POA: Diagnosis present

## 2023-07-06 DIAGNOSIS — Z888 Allergy status to other drugs, medicaments and biological substances status: Secondary | ICD-10-CM

## 2023-07-06 DIAGNOSIS — Z803 Family history of malignant neoplasm of breast: Secondary | ICD-10-CM

## 2023-07-06 DIAGNOSIS — Z96612 Presence of left artificial shoulder joint: Secondary | ICD-10-CM | POA: Diagnosis present

## 2023-07-06 DIAGNOSIS — M19011 Primary osteoarthritis, right shoulder: Secondary | ICD-10-CM | POA: Diagnosis not present

## 2023-07-06 DIAGNOSIS — K219 Gastro-esophageal reflux disease without esophagitis: Secondary | ICD-10-CM | POA: Diagnosis present

## 2023-07-06 DIAGNOSIS — Z91041 Radiographic dye allergy status: Secondary | ICD-10-CM | POA: Diagnosis not present

## 2023-07-06 DIAGNOSIS — Z885 Allergy status to narcotic agent status: Secondary | ICD-10-CM

## 2023-07-06 DIAGNOSIS — Z8249 Family history of ischemic heart disease and other diseases of the circulatory system: Secondary | ICD-10-CM

## 2023-07-06 DIAGNOSIS — Z96611 Presence of right artificial shoulder joint: Principal | ICD-10-CM

## 2023-07-06 DIAGNOSIS — Z91048 Other nonmedicinal substance allergy status: Secondary | ICD-10-CM

## 2023-07-06 DIAGNOSIS — Z882 Allergy status to sulfonamides status: Secondary | ICD-10-CM | POA: Diagnosis not present

## 2023-07-06 DIAGNOSIS — G8918 Other acute postprocedural pain: Secondary | ICD-10-CM | POA: Diagnosis not present

## 2023-07-06 DIAGNOSIS — M12811 Other specific arthropathies, not elsewhere classified, right shoulder: Secondary | ICD-10-CM

## 2023-07-06 DIAGNOSIS — M75101 Unspecified rotator cuff tear or rupture of right shoulder, not specified as traumatic: Secondary | ICD-10-CM | POA: Diagnosis not present

## 2023-07-06 HISTORY — PX: REVERSE SHOULDER ARTHROPLASTY: SHX5054

## 2023-07-06 SURGERY — ARTHROPLASTY, SHOULDER, TOTAL, REVERSE
Anesthesia: General | Site: Shoulder | Laterality: Right

## 2023-07-06 MED ORDER — EPHEDRINE SULFATE (PRESSORS) 50 MG/ML IJ SOLN
INTRAMUSCULAR | Status: DC | PRN
  Administered 2023-07-06: 5 mg via INTRAVENOUS
  Administered 2023-07-06 (×2): 10 mg via INTRAVENOUS

## 2023-07-06 MED ORDER — FENTANYL CITRATE PF 50 MCG/ML IJ SOSY
50.0000 ug | PREFILLED_SYRINGE | INTRAMUSCULAR | Status: DC
  Administered 2023-07-06: 50 ug via INTRAVENOUS
  Filled 2023-07-06: qty 2

## 2023-07-06 MED ORDER — ACETAMINOPHEN 325 MG PO TABS
325.0000 mg | ORAL_TABLET | Freq: Four times a day (QID) | ORAL | Status: DC | PRN
  Administered 2023-07-07 – 2023-07-09 (×3): 650 mg via ORAL
  Filled 2023-07-06 (×3): qty 2

## 2023-07-06 MED ORDER — PROPOFOL 10 MG/ML IV BOLUS
INTRAVENOUS | Status: AC
  Filled 2023-07-06: qty 20

## 2023-07-06 MED ORDER — LACTATED RINGERS IV SOLN: INTRAVENOUS | Status: DC | PRN

## 2023-07-06 MED ORDER — DOCUSATE SODIUM 100 MG PO CAPS
100.0000 mg | ORAL_CAPSULE | Freq: Two times a day (BID) | ORAL | Status: DC
  Administered 2023-07-06 – 2023-07-10 (×9): 100 mg via ORAL
  Filled 2023-07-06 (×9): qty 1

## 2023-07-06 MED ORDER — KETAMINE HCL 50 MG/5ML IJ SOSY
PREFILLED_SYRINGE | INTRAMUSCULAR | Status: AC
  Filled 2023-07-06: qty 5

## 2023-07-06 MED ORDER — TRAZODONE HCL 50 MG PO TABS
50.0000 mg | ORAL_TABLET | Freq: Every day | ORAL | Status: DC
  Administered 2023-07-06 – 2023-07-09 (×4): 50 mg via ORAL
  Filled 2023-07-06 (×4): qty 1

## 2023-07-06 MED ORDER — AMLODIPINE BESYLATE 5 MG PO TABS
2.5000 mg | ORAL_TABLET | Freq: Every morning | ORAL | Status: DC
  Administered 2023-07-07 – 2023-07-10 (×4): 2.5 mg via ORAL
  Filled 2023-07-06 (×4): qty 1

## 2023-07-06 MED ORDER — POLYETHYLENE GLYCOL 3350 17 G PO PACK: 17.0000 g | PACK | Freq: Every day | ORAL | Status: DC | PRN

## 2023-07-06 MED ORDER — PRAMIPEXOLE DIHYDROCHLORIDE 0.25 MG PO TABS
0.2500 mg | ORAL_TABLET | Freq: Every day | ORAL | Status: DC
  Administered 2023-07-06 – 2023-07-09 (×4): 0.25 mg via ORAL
  Filled 2023-07-06 (×4): qty 1

## 2023-07-06 MED ORDER — ALUM & MAG HYDROXIDE-SIMETH 200-200-20 MG/5ML PO SUSP
30.0000 mL | ORAL | Status: DC | PRN
  Administered 2023-07-08: 30 mL via ORAL
  Filled 2023-07-06: qty 30

## 2023-07-06 MED ORDER — ONDANSETRON HCL 4 MG/2ML IJ SOLN
INTRAMUSCULAR | Status: DC | PRN
  Administered 2023-07-06: 4 mg via INTRAVENOUS

## 2023-07-06 MED ORDER — SUCCINYLCHOLINE CHLORIDE 200 MG/10ML IV SOSY
PREFILLED_SYRINGE | INTRAVENOUS | Status: AC
Start: 2023-07-06 — End: ?
  Filled 2023-07-06: qty 10

## 2023-07-06 MED ORDER — TRANEXAMIC ACID-NACL 1000-0.7 MG/100ML-% IV SOLN
1000.0000 mg | INTRAVENOUS | Status: AC
  Administered 2023-07-06: 1000 mg via INTRAVENOUS
  Filled 2023-07-06: qty 100

## 2023-07-06 MED ORDER — DIPHENHYDRAMINE HCL 25 MG PO CAPS: 25.0000 mg | ORAL_CAPSULE | Freq: Four times a day (QID) | ORAL | Status: DC | PRN

## 2023-07-06 MED ORDER — TRANEXAMIC ACID-NACL 1000-0.7 MG/100ML-% IV SOLN
INTRAVENOUS | Status: AC
  Filled 2023-07-06: qty 100

## 2023-07-06 MED ORDER — SODIUM CHLORIDE 0.9 % IR SOLN
Status: DC | PRN
  Administered 2023-07-06: 1000 mL

## 2023-07-06 MED ORDER — METHOCARBAMOL 500 MG PO TABS
500.0000 mg | ORAL_TABLET | Freq: Four times a day (QID) | ORAL | Status: DC | PRN
  Filled 2023-07-06: qty 1

## 2023-07-06 MED ORDER — DEXAMETHASONE SODIUM PHOSPHATE 10 MG/ML IJ SOLN
INTRAMUSCULAR | Status: AC
  Filled 2023-07-06: qty 1

## 2023-07-06 MED ORDER — DEXAMETHASONE SODIUM PHOSPHATE 10 MG/ML IJ SOLN
INTRAMUSCULAR | Status: DC | PRN
  Administered 2023-07-06: 10 mg via INTRAVENOUS

## 2023-07-06 MED ORDER — ONDANSETRON HCL 4 MG/2ML IJ SOLN
INTRAMUSCULAR | Status: AC
  Filled 2023-07-06: qty 2

## 2023-07-06 MED ORDER — ACETAMINOPHEN 500 MG PO TABS
1000.0000 mg | ORAL_TABLET | Freq: Four times a day (QID) | ORAL | Status: AC
  Administered 2023-07-06 – 2023-07-07 (×3): 1000 mg via ORAL
  Filled 2023-07-06 (×3): qty 2

## 2023-07-06 MED ORDER — PROPOFOL 10 MG/ML IV BOLUS
INTRAVENOUS | Status: DC | PRN
  Administered 2023-07-06: 110 mg via INTRAVENOUS

## 2023-07-06 MED ORDER — WATER FOR IRRIGATION, STERILE IR SOLN
Status: DC | PRN
  Administered 2023-07-06: 1000 mL

## 2023-07-06 MED ORDER — TRAMADOL HCL 50 MG PO TABS: 50.0000 mg | ORAL_TABLET | Freq: Four times a day (QID) | ORAL | Status: DC | PRN

## 2023-07-06 MED ORDER — SIMVASTATIN 40 MG PO TABS: 40.0000 mg | ORAL_TABLET | Freq: Every day | ORAL | Status: DC

## 2023-07-06 MED ORDER — PHENOL 1.4 % MT LIQD: 1.0000 | OROMUCOSAL | Status: DC | PRN

## 2023-07-06 MED ORDER — HYDROMORPHONE HCL 1 MG/ML IJ SOLN: 0.5000 mg | INTRAMUSCULAR | Status: DC | PRN

## 2023-07-06 MED ORDER — ONDANSETRON HCL 4 MG PO TABS: 4.0000 mg | ORAL_TABLET | Freq: Four times a day (QID) | ORAL | Status: DC | PRN

## 2023-07-06 MED ORDER — FLEET ENEMA RE ENEM: 1.0000 | ENEMA | Freq: Once | RECTAL | Status: DC | PRN

## 2023-07-06 MED ORDER — BUPIVACAINE LIPOSOME 1.3 % IJ SUSP
INTRAMUSCULAR | Status: DC | PRN
Start: 2023-07-06 — End: 2023-07-06
  Administered 2023-07-06: 10 mL via PERINEURAL

## 2023-07-06 MED ORDER — SODIUM CHLORIDE 0.9 % IV SOLN: INTRAVENOUS | Status: DC

## 2023-07-06 MED ORDER — LIDOCAINE HCL (CARDIAC) PF 100 MG/5ML IV SOSY
PREFILLED_SYRINGE | INTRAVENOUS | Status: DC | PRN
  Administered 2023-07-06: 50 mg via INTRAVENOUS

## 2023-07-06 MED ORDER — DULOXETINE HCL 60 MG PO CPEP
60.0000 mg | ORAL_CAPSULE | Freq: Every day | ORAL | Status: DC
  Administered 2023-07-07 – 2023-07-10 (×4): 60 mg via ORAL
  Filled 2023-07-06 (×4): qty 1

## 2023-07-06 MED ORDER — MENTHOL 3 MG MT LOZG: 1.0000 | LOZENGE | OROMUCOSAL | Status: DC | PRN

## 2023-07-06 MED ORDER — METHOCARBAMOL 1000 MG/10ML IJ SOLN: 500.0000 mg | Freq: Four times a day (QID) | INTRAMUSCULAR | Status: DC | PRN

## 2023-07-06 MED ORDER — ORAL CARE MOUTH RINSE: 15.0000 mL | Freq: Once | OROMUCOSAL | Status: AC

## 2023-07-06 MED ORDER — ONDANSETRON HCL 4 MG/2ML IJ SOLN: 4.0000 mg | Freq: Four times a day (QID) | INTRAMUSCULAR | Status: DC | PRN

## 2023-07-06 MED ORDER — BISACODYL 5 MG PO TBEC: 5.0000 mg | DELAYED_RELEASE_TABLET | Freq: Every day | ORAL | Status: DC | PRN

## 2023-07-06 MED ORDER — GABAPENTIN 300 MG PO CAPS
300.0000 mg | ORAL_CAPSULE | Freq: Four times a day (QID) | ORAL | Status: DC
  Administered 2023-07-06 – 2023-07-10 (×16): 300 mg via ORAL
  Filled 2023-07-06 (×16): qty 1

## 2023-07-06 MED ORDER — ATORVASTATIN CALCIUM 20 MG PO TABS
20.0000 mg | ORAL_TABLET | Freq: Every day | ORAL | Status: DC
Start: 2023-07-06 — End: 2023-07-10
  Administered 2023-07-06 – 2023-07-09 (×4): 20 mg via ORAL
  Filled 2023-07-06 (×4): qty 1

## 2023-07-06 MED ORDER — DIPHENHYDRAMINE HCL 12.5 MG/5ML PO ELIX: 12.5000 mg | ORAL_SOLUTION | ORAL | Status: DC | PRN

## 2023-07-06 MED ORDER — PHENYLEPHRINE HCL-NACL 20-0.9 MG/250ML-% IV SOLN
INTRAVENOUS | Status: DC | PRN
  Administered 2023-07-06: 50 ug/min via INTRAVENOUS

## 2023-07-06 MED ORDER — SUCCINYLCHOLINE CHLORIDE 200 MG/10ML IV SOSY
PREFILLED_SYRINGE | INTRAVENOUS | Status: DC | PRN
  Administered 2023-07-06: 100 mg via INTRAVENOUS

## 2023-07-06 MED ORDER — BUPIVACAINE HCL (PF) 0.5 % IJ SOLN
INTRAMUSCULAR | Status: DC | PRN
  Administered 2023-07-06: 15 mL via PERINEURAL

## 2023-07-06 MED ORDER — LIDOCAINE HCL (PF) 2 % IJ SOLN
INTRAMUSCULAR | Status: AC
  Filled 2023-07-06: qty 5

## 2023-07-06 MED ORDER — CHLORHEXIDINE GLUCONATE 0.12 % MT SOLN
15.0000 mL | Freq: Once | OROMUCOSAL | Status: AC
  Administered 2023-07-06: 15 mL via OROMUCOSAL

## 2023-07-06 MED ORDER — ASPIRIN 81 MG PO TBEC
81.0000 mg | DELAYED_RELEASE_TABLET | Freq: Every day | ORAL | Status: DC
  Administered 2023-07-07 – 2023-07-10 (×4): 81 mg via ORAL
  Filled 2023-07-06 (×4): qty 1

## 2023-07-06 MED ORDER — CEFAZOLIN SODIUM-DEXTROSE 2-4 GM/100ML-% IV SOLN
2.0000 g | Freq: Four times a day (QID) | INTRAVENOUS | Status: AC
Start: 2023-07-06 — End: 2023-07-07
  Administered 2023-07-06 – 2023-07-07 (×3): 2 g via INTRAVENOUS
  Filled 2023-07-06 (×3): qty 100

## 2023-07-06 MED ORDER — TRAMADOL HCL 50 MG PO TABS
50.0000 mg | ORAL_TABLET | Freq: Four times a day (QID) | ORAL | Status: DC | PRN
  Administered 2023-07-07 – 2023-07-08 (×2): 50 mg via ORAL
  Filled 2023-07-06 (×3): qty 1

## 2023-07-06 MED ORDER — CEFAZOLIN SODIUM-DEXTROSE 2-4 GM/100ML-% IV SOLN
2.0000 g | INTRAVENOUS | Status: AC
  Administered 2023-07-06: 2 g via INTRAVENOUS
  Filled 2023-07-06: qty 100

## 2023-07-06 MED ORDER — EPHEDRINE 5 MG/ML INJ
INTRAVENOUS | Status: AC
  Filled 2023-07-06: qty 5

## 2023-07-06 SURGICAL SUPPLY — 75 items
AID PSTN UNV HD RSTRNT DISP (MISCELLANEOUS) ×1
BAG COUNTER SPONGE SURGICOUNT (BAG) IMPLANT
BAG SPEC THK2 15X12 ZIP CLS (MISCELLANEOUS) ×1
BAG SPNG CNTER NS LX DISP (BAG) ×1
BAG ZIPLOCK 12X15 (MISCELLANEOUS) ×1 IMPLANT
BASEPLATE P2 COATD GLND 6.5X30 (Shoulder) IMPLANT
BIT DRILL 1.6MX128 (BIT) IMPLANT
BIT DRILL 2.5 DIA 127 CALI (BIT) IMPLANT
BIT DRILL 4 DIA CALIBRATED (BIT) IMPLANT
BLADE SAW SGTL 73X25 THK (BLADE) ×1 IMPLANT
BOOTIES KNEE HIGH SLOAN (MISCELLANEOUS) ×2 IMPLANT
BSPLAT GLND 30 STRL LF SHLDR (Shoulder) ×1 IMPLANT
COOLER ICEMAN CLASSIC (MISCELLANEOUS) IMPLANT
COVER BACK TABLE 60X90IN (DRAPES) ×1 IMPLANT
COVER SURGICAL LIGHT HANDLE (MISCELLANEOUS) ×1 IMPLANT
DRAPE INCISE IOBAN 66X45 STRL (DRAPES) ×1 IMPLANT
DRAPE POUCH INSTRU U-SHP 10X18 (DRAPES) ×1 IMPLANT
DRAPE SHEET LG 3/4 BI-LAMINATE (DRAPES) ×1 IMPLANT
DRAPE SURG 17X11 SM STRL (DRAPES) ×1 IMPLANT
DRAPE SURG ORHT 6 SPLT 77X108 (DRAPES) ×2 IMPLANT
DRAPE TOP 10253 STERILE (DRAPES) ×1 IMPLANT
DRAPE U-SHAPE 47X51 STRL (DRAPES) ×1 IMPLANT
DRSG AQUACEL AG ADV 3.5X 6 (GAUZE/BANDAGES/DRESSINGS) ×1 IMPLANT
DURAPREP 26ML APPLICATOR (WOUND CARE) ×2 IMPLANT
ELECT BLADE TIP CTD 4 INCH (ELECTRODE) ×1 IMPLANT
ELECT REM PT RETURN 15FT ADLT (MISCELLANEOUS) ×1 IMPLANT
FACESHIELD WRAPAROUND (MASK) ×1 IMPLANT
FACESHIELD WRAPAROUND OR TEAM (MASK) ×1 IMPLANT
GLOVE BIO SURGEON STRL SZ7.5 (GLOVE) ×1 IMPLANT
GLOVE BIOGEL PI IND STRL 6.5 (GLOVE) ×1 IMPLANT
GLOVE BIOGEL PI IND STRL 8 (GLOVE) ×1 IMPLANT
GLOVE SURG SS PI 6.5 STRL IVOR (GLOVE) ×1 IMPLANT
GOWN STRL REUS W/ TWL LRG LVL3 (GOWN DISPOSABLE) ×1 IMPLANT
GOWN STRL REUS W/ TWL XL LVL3 (GOWN DISPOSABLE) ×1 IMPLANT
GOWN STRL REUS W/TWL LRG LVL3 (GOWN DISPOSABLE) ×1
GOWN STRL REUS W/TWL XL LVL3 (GOWN DISPOSABLE) ×1
HANDPIECE INTERPULSE COAX TIP (DISPOSABLE) ×1
HOOD PEEL AWAY T7 (MISCELLANEOUS) ×3 IMPLANT
INSERT SMALL SOCKET 32MM NEU (Insert) IMPLANT
KIT BASIN OR (CUSTOM PROCEDURE TRAY) ×1 IMPLANT
KIT TURNOVER KIT A (KITS) IMPLANT
MANIFOLD NEPTUNE II (INSTRUMENTS) ×1 IMPLANT
NDL TROCAR POINT SZ 2 1/2 (NEEDLE) IMPLANT
NEEDLE TROCAR POINT SZ 2 1/2 (NEEDLE) IMPLANT
NS IRRIG 1000ML POUR BTL (IV SOLUTION) ×1 IMPLANT
P2 COATDE GLNOID BSEPLT 6.5X30 (Shoulder) ×1 IMPLANT
PACK SHOULDER (CUSTOM PROCEDURE TRAY) ×1 IMPLANT
PAD COLD SHLDR WRAP-ON (PAD) IMPLANT
PROTECTOR NERVE ULNAR (MISCELLANEOUS) IMPLANT
RESTRAINT HEAD UNIVERSAL NS (MISCELLANEOUS) ×1 IMPLANT
RETRIEVER SUT HEWSON (MISCELLANEOUS) IMPLANT
SCREW BONE LOCKING RSP 5.0X14 (Screw) ×2 IMPLANT
SCREW BONE LOCKING RSP 5.0X30 (Screw) ×2 IMPLANT
SCREW BONE RSP LOCK 5X14 (Screw) IMPLANT
SCREW BONE RSP LOCK 5X30 (Screw) IMPLANT
SCREW RETAIN W/HEAD 4MM OFFSET (Shoulder) IMPLANT
SET HNDPC FAN SPRY TIP SCT (DISPOSABLE) ×1 IMPLANT
SLING ARM IMMOBILIZER LRG (SOFTGOODS) IMPLANT
SLING ARM IMMOBILIZER MED (SOFTGOODS) IMPLANT
STEM HUMERAL 8X48 SHOULDER (Miscellaneous) IMPLANT
STRIP CLOSURE SKIN 1/2X4 (GAUZE/BANDAGES/DRESSINGS) ×2 IMPLANT
SUCTION TUBE FRAZIER 10FR DISP (SUCTIONS) IMPLANT
SUPPORT WRAP ARM LG (MISCELLANEOUS) ×1 IMPLANT
SUT ETHIBOND 2 V 37 (SUTURE) IMPLANT
SUT FIBERWIRE #2 38 REV NDL BL (SUTURE)
SUT MNCRL AB 4-0 PS2 18 (SUTURE) ×1 IMPLANT
SUT VIC AB 2-0 CT1 27 (SUTURE) ×2
SUT VIC AB 2-0 CT1 TAPERPNT 27 (SUTURE) ×2 IMPLANT
SUTURE FIBERWR#2 38 REV NDL BL (SUTURE) IMPLANT
TAP SURG THRD DJ 6.5 (ORTHOPEDIC DISPOSABLE SUPPLIES) IMPLANT
TAPE LABRALWHITE 1.5X36 (TAPE) IMPLANT
TAPE SUT LABRALTAP WHT/BLK (SUTURE) IMPLANT
TOWEL OR 17X26 10 PK STRL BLUE (TOWEL DISPOSABLE) ×1 IMPLANT
TOWEL OR NON WOVEN STRL DISP B (DISPOSABLE) ×1 IMPLANT
WATER STERILE IRR 1000ML POUR (IV SOLUTION) ×1 IMPLANT

## 2023-07-06 NOTE — Discharge Instructions (Addendum)
Discharge Instructions after Reverse Total Shoulder Arthroplasty   A sling has been provided for you. You are to wear this at all times (except for bathing and dressing), until your first post operative visit with Dr. Chandler. Please also wear while sleeping at night. While you bath and dress, let the arm/elbow extend straight down to stretch your elbow. Wiggle your fingers and pump your first while your in the sling to prevent hand swelling. Use ice on the shoulder intermittently over the first 48 hours after surgery. Continue to use ice or and ice machine as needed after 48 hours for pain control/swelling.  Pain medicine has been prescribed for you.  Use your medicine liberally over the first 48 hours, and then you can begin to taper your use. You may take Extra Strength Tylenol or Tylenol only in place of the pain pills. DO NOT take ANY nonsteroidal anti-inflammatory pain medications: Advil, Motrin, Ibuprofen, Aleve, Naproxen or Naprosyn.  Take one aspirin 81mg a day for 2 weeks after surgery, unless you have an aspirin sensitivity/allergy or asthma.  Leave your dressing on until your first follow up visit.  You may shower with the dressing.  Hold your arm as if you still have your sling on while you shower. Simply allow the water to wash over the site and then pat dry. Make sure your axilla (armpit) is completely dry after showering.    Please call [phone] during normal business hours or [phone] after hours for any problems. Including the following:  - excessive redness of the incisions - drainage for more than 4 days - fever of more than 101.5 F  *Please note that pain medications will not be refilled after hours or on weekends.  Dental Antibiotics:  In most cases prophylactic antibiotics for Dental procdeures after total joint surgery are not necessary.  Exceptions are as follows:  1. History of prior total joint infection  2. Severely immunocompromised (Organ  Transplant, cancer chemotherapy, Rheumatoid biologic meds such as Humera)  3. Poorly controlled diabetes (A1C &gt; 8.0, blood glucose over 200)  If you have one of these conditions, contact your surgeon for an antibiotic prescription, prior to your dental procedure.      

## 2023-07-06 NOTE — Anesthesia Procedure Notes (Signed)
Anesthesia Procedure Image    

## 2023-07-06 NOTE — Anesthesia Procedure Notes (Signed)
Procedure Name: Intubation Date/Time: 07/06/2023 9:41 AM  Performed by: Hulan Fess, CRNAPre-anesthesia Checklist: Patient identified, Emergency Drugs available, Suction available and Patient being monitored Patient Re-evaluated:Patient Re-evaluated prior to induction Oxygen Delivery Method: Circle System Utilized Preoxygenation: Pre-oxygenation with 100% oxygen Induction Type: IV induction Ventilation: Mask ventilation without difficulty Laryngoscope Size: Glidescope and 3 Grade View: Grade I Tube type: Oral Tube size: 7.0 mm Number of attempts: 1 Airway Equipment and Method: Stylet and Oral airway Placement Confirmation: ETT inserted through vocal cords under direct vision, positive ETCO2 and breath sounds checked- equal and bilateral Secured at: 21 cm Tube secured with: Tape Dental Injury: Teeth and Oropharynx as per pre-operative assessment

## 2023-07-06 NOTE — Anesthesia Postprocedure Evaluation (Signed)
Anesthesia Post Note  Patient: Maribel Winesett Vowels  Procedure(s) Performed: REVERSE SHOULDER ARTHROPLASTY (Right: Shoulder)     Patient location during evaluation: PACU Anesthesia Type: General Level of consciousness: awake and alert Pain management: pain level controlled Vital Signs Assessment: post-procedure vital signs reviewed and stable Respiratory status: spontaneous breathing, nonlabored ventilation, respiratory function stable and patient connected to nasal cannula oxygen Cardiovascular status: blood pressure returned to baseline and stable Postop Assessment: no apparent nausea or vomiting Anesthetic complications: no  No notable events documented.  Last Vitals:  Vitals:   07/06/23 1124 07/06/23 1130  BP:  (!) 162/74  Pulse:  99  Resp:  (!) 27  Temp:    SpO2: 93% 94%    Last Pain:  Vitals:   07/06/23 1130  TempSrc:   PainSc: 0-No pain                 Amorina Doerr S

## 2023-07-06 NOTE — Progress Notes (Signed)
The order for simvastatin(Zocor) was changed to an equivalent dose of atorvastatin(Lipitor) due to the potential drug interaction with amlodipine.  When taken in combination with medications that inhibit its metabolism, simvastatin can accumulate which increases the risk of liver toxicity, myopathy, or rhabdomyolysis.  Simvastatin dose should not exceed 20mg /day in patients taking amlodipine, ranolazine or amiodarone.   Please consider this potential interaction at discharge.  Temple Ewart A 07/06/2023 2:53 PM

## 2023-07-06 NOTE — Op Note (Signed)
Procedure(s): REVERSE SHOULDER ARTHROPLASTY Procedure Note  Kimberly [name] female 82 y.o. 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 Pain and dysfunction interfered with quality of life and nonoperative treatment with activity modification, NSAIDS and injections failed.     Surgeon: Glennon Hamilton   Assistants: Fredia Sorrow PA-C Amber was present and scrubbed throughout the procedure and was essential in positioning, retraction, exposure, and closure)  Anesthesia: General endotracheal anesthesia with preoperative interscalene block given by the attending anesthesiologist   Procedure Detail  REVERSE SHOULDER ARTHROPLASTY   Estimated Blood Loss:  200 mL         Drains: none  Blood Given: none          Specimens: none        Complications:  * No complications entered in OR log *         Disposition: PACU - hemodynamically stable.         Condition: stable      OPERATIVE FINDINGS:  A DJO Altivate pressfit reverse total shoulder arthroplasty was placed with a  size 8 stem, a 32-4 glenosphere, and a standard-mm poly insert. The base plate  fixation was excellent.  PROCEDURE: The patient was identified in the preoperative holding area  where I personally marked the operative site after verifying site, side,  and procedure with the patient. An interscalene block given by  the attending anesthesiologist in the holding area and the patient was taken back to the operating room where all extremities were  carefully padded in position after general anesthesia was induced. She  was placed in a beach-chair position and the operative upper extremity was  prepped and draped in a standard sterile  fashion. An approximately 10-  cm incision was made from the tip of the coracoid process to the center  point of the humerus at the level of the axilla. Dissection was carried  down through subcutaneous tissues to the level of the cephalic vein  which was taken laterally with the deltoid. The pectoralis major was  retracted medially. The subdeltoid space was developed and the lateral  edge of the conjoined tendon was identified. The undersurface of  conjoined tendon was palpated and the musculocutaneous nerve was not in  the field. Retractor was placed underneath the conjoined and second  retractor was placed lateral into the deltoid. The circumflex humeral  artery and vessels were identified and clamped and coagulated. The  biceps tendon was tenotomized.  The subscapularis was taken down as a peel with the underlying capsule.  The  joint was then gently externally rotated while the capsule was released  from the humeral neck around to just beyond the 6 o'clock position. At  this point, the joint was dislocated and the humeral head was presented  into the wound. The excessive osteophyte formation was removed with a  large rongeur.  The cutting guide was used to make the appropriate  head cut and the head was saved for potentially bone grafting.  The glenoid was exposed with the arm in an  abducted extended position. The anterior and posterior labrum were  completely excised and the capsule was released circumferentially to  allow for exposure of the glenoid for preparation. The 2.5 mm drill was  placed using  the guide in 5-10 inferior angulation and the tap was then advanced in the same hole. Small and large reamers were then used. The tap was then removed and the Metaglene was then screwed in with excellent purchase.  The peripheral guide was then used to drilled measured and filled peripheral locking screws. The size 32-4 glenosphere was then impacted on the [facility] taper and the central screw  was placed. The humerus was then again exposed and the diaphyseal reamers were used followed by the metaphyseal reamers. The final broach was left in place in the proximal trial was placed. The joint was reduced and with this implant it was felt that soft tissue tensioning was appropriate with excellent stability and excellent range of motion. Therefore, final humeral stem was placed press-fit.  And then the trial polyethylene inserts were tested again and the above implant was felt to be the most appropriate for final insertion. The joint was reduced taken through full range of motion and felt to be stable. Soft tissue tension was appropriate.  The joint was then copiously irrigated with pulse  lavage and the wound was then closed. The subscapularis was not repaired.  Skin was closed with 2-0 Vicryl in a deep dermal layer and 4-0  Monocryl for skin closure. Steri-Strips were applied. Sterile  dressings were then applied as well as a sling. The patient was allowed  to awaken from general anesthesia, transferred to stretcher, and taken  to recovery room in stable condition.   POSTOPERATIVE PLAN: The patient will be kept in the hospital postoperatively  for observation and pain control.

## 2023-07-06 NOTE — Transfer of Care (Signed)
Immediate Anesthesia Transfer of Care Note  Patient: Kimberly [name]  Procedure(s) Performed: REVERSE SHOULDER ARTHROPLASTY (Right: Shoulder)  Patient Location: PACU  Anesthesia Type:General  Level of Consciousness: awake, alert , and oriented  Airway & Oxygen Therapy: Patient Spontanous Breathing and Patient connected to nasal cannula oxygen  Post-op Assessment: Report given to RN and Post -op Vital signs reviewed and stable  Post vital signs: Reviewed and stable  Last Vitals:  Vitals Value Taken Time  BP 169/86 07/06/23 1100  Temp    Pulse 102 07/06/23 1101  Resp 19 07/06/23 1101  SpO2 96 % 07/06/23 1101  Vitals shown include unfiled device data.  Last Pain:  Vitals:   07/06/23 0731  TempSrc: Oral  PainSc: 0-No pain      Patients Stated Pain Goal: 4 (07/06/23 0731)  Complications: No notable events documented.

## 2023-07-06 NOTE — Anesthesia Preprocedure Evaluation (Signed)
Anesthesia Evaluation  Patient identified by MRN, date of birth, ID band Patient awake    Reviewed: Allergy & Precautions, H&P , NPO status , Patient's Chart, lab work & pertinent test results  History of Anesthesia Complications (+) DIFFICULT AIRWAY and history of anesthetic complications  Airway Mallampati: III  TM Distance: <3 FB Neck ROM: Full    Dental no notable dental hx.    Pulmonary neg pulmonary ROS   Pulmonary exam normal breath sounds clear to auscultation       Cardiovascular hypertension, Normal cardiovascular exam Rhythm:Regular Rate:Normal     Neuro/Psych negative neurological ROS  negative psych ROS   GI/Hepatic negative GI ROS, Neg liver ROS,,,  Endo/Other  negative endocrine ROS    Renal/GU negative Renal ROS  negative genitourinary   Musculoskeletal  (+) Arthritis , Osteoarthritis,    Abdominal   Peds negative pediatric ROS (+)  Hematology negative hematology ROS (+)   Anesthesia Other Findings   Reproductive/Obstetrics negative OB ROS                             Anesthesia Physical Anesthesia Plan  ASA: 2  Anesthesia Plan: General   Post-op Pain Management: Regional block*   Induction: Intravenous  PONV Risk Score and Plan: 3 and Ondansetron, Dexamethasone and Treatment may vary due to age or medical condition  Airway Management Planned: Oral ETT and Video Laryngoscope Planned  Additional Equipment:   Intra-op Plan:   Post-operative Plan: Extubation in OR  Informed Consent: I have reviewed the patients History and Physical, chart, labs and discussed the procedure including the risks, benefits and alternatives for the proposed anesthesia with the patient or authorized representative who has indicated his/her understanding and acceptance.     Dental advisory given  Plan Discussed with: CRNA and Surgeon  Anesthesia Plan Comments:         Anesthesia Quick Evaluation

## 2023-07-06 NOTE — H&P (Signed)
Kimberly [name] is an 82 y.o. female.   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  Pain disrupts sleep and quality life.  Failed conservative measures.  Past Medical History:  Diagnosis Date   Arthritis    Complication of anesthesia    Pt reports that she was told "someone had to breath for her" She is not clear what this means   Difficult intubation    Diverticulitis    GERD (gastroesophageal reflux disease)    History of COVID-19 05/2020   Hypercholesteremia    Hypertension    Mild cognitive impairment    Peripheral neuropathy    Pre-diabetes    Restless leg syndrome     Past Surgical History:  Procedure Laterality Date   ABDOMINAL HYSTERECTOMY     Age 33   BACK SURGERY  2004   blephroplasty     bilateral   BREAST BIOPSY Left    COLONOSCOPY WITH PROPOFOL N/A 11/13/2020   Procedure: COLONOSCOPY WITH PROPOFOL;  Surgeon: Jeani Hawking, MD;  Location: [facility];  Service: Endoscopy;  Laterality: N/A;   ESOPHAGOGASTRODUODENOSCOPY (EGD) WITH PROPOFOL N/A 11/13/2020   Procedure: ESOPHAGOGASTRODUODENOSCOPY (EGD) WITH PROPOFOL;  Surgeon: Jeani Hawking, MD;  Location: [facility];  Service: Endoscopy;  Laterality: N/A;   EYE SURGERY     bilateral cateract    POLYPECTOMY  11/13/2020   Procedure: POLYPECTOMY;  Surgeon: Jeani Hawking, MD;  Location: [facility];  Service: Endoscopy;;   REVERSE SHOULDER ARTHROPLASTY Left 04/07/2022   Procedure: REVERSE SHOULDER ARTHROPLASTY;  Surgeon: Jones Broom, MD;  Location: [facility];  Service: Orthopedics;  Laterality: Left;   TONSILLECTOMY     As a child   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   Procedure: TOTAL KNEE ARTHROPLASTY;  Surgeon: Durene Romans, MD;  Location: [facility];  Service: Orthopedics;  Laterality: Left;  70 mins   TRANSFORAMINAL LUMBAR INTERBODY FUSION (TLIF) WITH PEDICLE SCREW FIXATION 1 LEVEL Right 01/12/2023   Procedure:  RIGHT-SIDED TRSANSFORAMINAL LUMBAR INTERBODY FUSION LUMBAR 4 - LUMBAR 5  WITH INSTRUMENTATION AND ALLOGRAFT;  Surgeon: Estill Bamberg, MD;  Location: [facility];  Service: Orthopedics;  Laterality: Right;    Family History  Problem Relation Age of Onset   Breast cancer Sister    Other Mother        passed away at age 68   Heart attack Father        passed away at age 47   Social History:  reports that she has never smoked. She has never used smokeless tobacco. She reports that she does not drink alcohol and does not use drugs.  Allergies:  Allergies  Allergen Reactions   Amoxicillin Swelling    Throat felt swollen   Ciprofloxacin Itching   Adhesive [Tape] Itching    Redness   Benzoyl Peroxide Itching   Metronidazole Hives   Oxycodone Other (See Comments)    Felt disoriented   Betadine [Povidone Iodine] Rash   Hydrocodone Itching and Anxiety   Sulfa Antibiotics Itching and Rash    Medications Prior to Admission  Medication Sig Dispense Refill   acetaminophen (TYLENOL) 650 MG CR tablet Take 650-1,300 mg by mouth See admin instructions. Take 650 mg in the AM, 650 mg at noon, and 1300 mg at bedtime.     amLODipine (NORVASC) 2.5 MG tablet Take 2.5 mg by mouth every morning.     diphenhydrAMINE (BENADRYL) 25 mg capsule Take 1-2 capsules (25-50 mg total) by mouth  every 6 (six) hours as needed for itching (Dose with px medications). 30 capsule 0   DULoxetine (CYMBALTA) 60 MG capsule TAKE 1 CAPSULE BY MOUTH EVERY DAY 90 capsule 3   gabapentin (NEURONTIN) 300 MG capsule Take 300 mg by mouth 4 (four) times daily.     meloxicam (MOBIC) 15 MG tablet Take 15 mg by mouth every morning.     omeprazole (PRILOSEC) 40 MG capsule Take 40 mg by mouth in the morning.     Polyethyl Glycol-Propyl Glycol (SYSTANE OP) Place 1 drop into both eyes every 6 (six) hours as needed (dry eyes).     pramipexole (MIRAPEX) 0.25 MG tablet Take 0.25 mg by mouth at bedtime.     simvastatin (ZOCOR) 40 MG tablet Take 40  mg by mouth at bedtime.      traMADol (ULTRAM) 50 MG tablet Take 1 tablet (50 mg total) by mouth every 6 (six) hours as needed for moderate pain. 30 tablet 0   traZODone (DESYREL) 50 MG tablet TAKE 2 TABLETS BY MOUTH AT BEDTIME. (Patient taking differently: Take 50 mg by mouth at bedtime.) 180 tablet 4   vitamin B-12 (CYANOCOBALAMIN) 500 MCG tablet Take 500 mcg by mouth in the morning.     docusate sodium (COLACE) 100 MG capsule Take 1 capsule (100 mg total) by mouth 2 (two) times daily. (Patient not taking: Reported on 06/23/2023) 10 capsule 0   HYDROcodone-acetaminophen (NORCO/VICODIN) 5-325 MG tablet Take 1-2 tablets by mouth every 4 (four) hours as needed for moderate pain ((score 4 to 6)). (Patient not taking: Reported on 06/23/2023) 30 tablet 0   ondansetron (ZOFRAN) 4 MG tablet Take 1 tablet (4 mg total) by mouth every 6 (six) hours as needed for nausea or vomiting. (Patient not taking: Reported on 06/23/2023) 20 tablet 0    No results found for this or any previous visit (from the past 48 hour(s)). No results found.  Review of Systems  All other systems reviewed and are negative.   Blood pressure (!) 169/83, pulse 85, temperature 98.6 F (37 C), temperature source Oral, resp. rate 16, height 5\' 3"  (1.6 m), weight 72.1 kg, SpO2 96%. Physical Exam HENT:     Head: Atraumatic.  Eyes:     Extraocular Movements: Extraocular movements intact.  Cardiovascular:     Pulses: Normal pulses.  Pulmonary:     Effort: Pulmonary effort is normal.  Musculoskeletal:     Comments: Right shoulder pain with limited range of motion  Neurological:     Mental Status: She is alert.      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  Pain disrupts sleep and quality life.  Failed conservative measures. Plan right reverse total shoulder arthroplasty Risks / benefits of surgery discussed Consent on chart  NPO for OR Preop antibiotics   Glennon Hamilton, MD 07/06/2023, 8:50 AM

## 2023-07-06 NOTE — Anesthesia Procedure Notes (Signed)
Anesthesia Regional Block: Interscalene brachial plexus block   Pre-Anesthetic Checklist: , timeout performed,  Correct Patient, Correct Site, Correct Laterality,  Correct Procedure, Correct Position, site marked,  Risks and benefits discussed,  Surgical consent,  Pre-op evaluation,  At surgeon's request and post-op pain management  Laterality: Right  Prep: chloraprep       Needles:  Injection technique: Single-shot  Needle Type: Echogenic Stimulator Needle     Needle Length: 9cm      Additional Needles:   Procedures:,,,, ultrasound used (permanent image in chart),,     Nerve Stimulator or Paresthesia:  Response: 0.48 mA  Additional Responses:   Narrative:  Start time: 07/06/2023 8:48 AM End time: 07/06/2023 8:55 AM Injection made incrementally with aspirations every 5 mL.  Performed by: Personally  Anesthesiologist: Eilene Ghazi, MD  Additional Notes: Patient tolerated the procedure well without complications

## 2023-07-07 DIAGNOSIS — Z96652 Presence of left artificial knee joint: Secondary | ICD-10-CM | POA: Diagnosis present

## 2023-07-07 DIAGNOSIS — Z96612 Presence of left artificial shoulder joint: Secondary | ICD-10-CM | POA: Diagnosis present

## 2023-07-07 DIAGNOSIS — Z882 Allergy status to sulfonamides status: Secondary | ICD-10-CM | POA: Diagnosis not present

## 2023-07-07 DIAGNOSIS — Z90710 Acquired absence of both cervix and uterus: Secondary | ICD-10-CM | POA: Diagnosis not present

## 2023-07-07 DIAGNOSIS — Z881 Allergy status to other antibiotic agents status: Secondary | ICD-10-CM | POA: Diagnosis not present

## 2023-07-07 DIAGNOSIS — Z885 Allergy status to narcotic agent status: Secondary | ICD-10-CM | POA: Diagnosis not present

## 2023-07-07 DIAGNOSIS — Z880 Allergy status to penicillin: Secondary | ICD-10-CM | POA: Diagnosis not present

## 2023-07-07 DIAGNOSIS — I10 Essential (primary) hypertension: Secondary | ICD-10-CM | POA: Diagnosis present

## 2023-07-07 DIAGNOSIS — K219 Gastro-esophageal reflux disease without esophagitis: Secondary | ICD-10-CM | POA: Diagnosis present

## 2023-07-07 DIAGNOSIS — Z8249 Family history of ischemic heart disease and other diseases of the circulatory system: Secondary | ICD-10-CM | POA: Diagnosis not present

## 2023-07-07 DIAGNOSIS — Z803 Family history of malignant neoplasm of breast: Secondary | ICD-10-CM | POA: Diagnosis not present

## 2023-07-07 DIAGNOSIS — Z91048 Other nonmedicinal substance allergy status: Secondary | ICD-10-CM | POA: Diagnosis not present

## 2023-07-07 DIAGNOSIS — E7800 Pure hypercholesterolemia, unspecified: Secondary | ICD-10-CM | POA: Diagnosis present

## 2023-07-07 DIAGNOSIS — Z91041 Radiographic dye allergy status: Secondary | ICD-10-CM | POA: Diagnosis not present

## 2023-07-07 DIAGNOSIS — R7303 Prediabetes: Secondary | ICD-10-CM | POA: Diagnosis present

## 2023-07-07 DIAGNOSIS — Z8616 Personal history of COVID-19: Secondary | ICD-10-CM | POA: Diagnosis not present

## 2023-07-07 DIAGNOSIS — Z79899 Other long term (current) drug therapy: Secondary | ICD-10-CM | POA: Diagnosis not present

## 2023-07-07 DIAGNOSIS — Z888 Allergy status to other drugs, medicaments and biological substances status: Secondary | ICD-10-CM | POA: Diagnosis not present

## 2023-07-07 DIAGNOSIS — G2581 Restless legs syndrome: Secondary | ICD-10-CM | POA: Diagnosis present

## 2023-07-07 DIAGNOSIS — M19011 Primary osteoarthritis, right shoulder: Secondary | ICD-10-CM | POA: Diagnosis present

## 2023-07-07 DIAGNOSIS — G629 Polyneuropathy, unspecified: Secondary | ICD-10-CM | POA: Diagnosis present

## 2023-07-07 DIAGNOSIS — Z791 Long term (current) use of non-steroidal anti-inflammatories (NSAID): Secondary | ICD-10-CM | POA: Diagnosis not present

## 2023-07-07 LAB — HEMOGLOBIN AND HEMATOCRIT, BLOOD
HCT: 35.9 % — ABNORMAL LOW (ref 36.0–46.0)
Hemoglobin: 11.6 g/dL — ABNORMAL LOW (ref 12.0–15.0)

## 2023-07-07 MED ORDER — ASPIRIN 81 MG PO TBEC: 81.0000 mg | DELAYED_RELEASE_TABLET | Freq: Every day | ORAL | Status: AC

## 2023-07-07 MED ORDER — TRAMADOL HCL 50 MG PO TABS: 50.0000 mg | ORAL_TABLET | Freq: Four times a day (QID) | ORAL | 0 refills | Status: DC | PRN

## 2023-07-07 MED ORDER — SIMVASTATIN 20 MG PO TABS: 20.0000 mg | ORAL_TABLET | Freq: Every day | ORAL | 0 refills | Status: DC

## 2023-07-07 MED ORDER — LABETALOL HCL 5 MG/ML IV SOLN
5.0000 mg | Freq: Once | INTRAVENOUS | Status: AC
  Administered 2023-07-07: 5 mg via INTRAVENOUS
  Filled 2023-07-07: qty 4

## 2023-07-07 NOTE — Evaluation (Signed)
Occupational Therapy Evaluation Patient Details Name: Kimberly [name] MRN: [id number] DOB: 1941-08-20 Today's Date: 07/07/2023   History of Present Illness Pt is an 82 year old female s/p right reverse shoulder arthroplasty on 07/06/2023   Clinical Impression   Pt seen today for OT eval with daughter present. Pt states IND at baseline with no AD for ADLs/mobility, lives alone and drives short distances. Initially states that she has assistance from daughter who lives across the street - daughter clarifies that she works full-time and is only available to provide intermittent assistance for pt.  Therapist provided education and instruction to patient and daughter in regards to exercises, precautions, positioning, donning upper extremity clothing and bathing while maintaining shoulder precautions, use of ice machine and donning/doffing sling. Pt requires continuous max multimodal cuing to avoid using RUE during functional tasks. Pt with very poor recall of basic precautions despite extensive education and instruction. OT provides demo verbally and visually with use of handouts. Pt unable to verbalize sling wearing schedule end of session or correctly return demo exercises with handout, requiring max cuing for recall. Discussed with pt and daughter recommendation for 24/7 support at discharge. Daughter cannot provide 24/7 assistance, and has no other family nearby. Pt is currently unsafe to discharge home at current level and would benefit from continued OT services to address functional deficits.     If plan is discharge home, recommend the following: A little help with walking and/or transfers;A lot of help with bathing/dressing/bathroom;Assistance with cooking/housework;Direct supervision/assist for medications management;Assist for transportation;Supervision due to cognitive status    Functional Status Assessment  Patient has had a recent decline in their functional status and demonstrates the  ability to make significant improvements in function in a reasonable and predictable amount of time.  Equipment Recommendations  None recommended by OT    Recommendations for Other Services Other (comment)     Precautions / Restrictions Precautions Precautions: Shoulder Type of Shoulder Precautions: Sling at all times except ADL/exercise, NWB Shoulder Interventions: Shoulder sling/immobilizer;At all times;Off for dressing/bathing/exercises Precaution Booklet Issued: Yes (comment) Required Braces or Orthoses: Sling Restrictions Weight Bearing Restrictions: Yes RUE Weight Bearing: Non weight bearing      Mobility Bed Mobility               General bed mobility comments: NT in recliner start/end session    Transfers Overall transfer level: Needs assistance Equipment used: None Transfers: Sit to/from Stand, Bed to chair/wheelchair/BSC Sit to Stand: Contact guard assist                  Balance Overall balance assessment: Mild deficits observed, not formally tested                                         ADL either performed or assessed with clinical judgement   ADL Overall ADL's : Needs assistance/impaired                         Toilet Transfer: Contact guard assist;Ambulation;Cueing for sequencing;Cueing for safety Toilet Transfer Details (indicate cue type and reason): required cuing for safety, awareness of IV pole mmgt, and to avoid using arm (even though arm immobilized in sling) Toileting- Clothing Manipulation and Hygiene: Contact guard assist;Cueing for sequencing;Cueing for safety;Sitting/lateral lean               Vision Baseline Vision/History:  1 Wears glasses              Pertinent Vitals/Pain Pain Assessment Pain Assessment: No/denies pain     Extremity/Trunk Assessment     Lower Extremity Assessment Lower Extremity Assessment: LLE deficits/detail LLE Deficits / Details: reports needing surgery on L  foot because it is "all messed up" so she does not walk long distances       Communication Communication Communication: No apparent difficulties   Cognition Arousal: Alert Behavior During Therapy: WFL for tasks assessed/performed Overall Cognitive Status: Within Functional Limits for tasks assessed                                          Exercises Exercises: Shoulder   Shoulder Instructions Shoulder Instructions Donning/doffing shirt without moving shoulder: Minimal assistance Method for sponge bathing under operated UE: Minimal assistance Donning/doffing sling/immobilizer: Maximal assistance Correct positioning of sling/immobilizer: Maximal assistance ROM for elbow, wrist and digits of operated UE: Maximal assistance Sling wearing schedule (on at all times/off for ADL's): Maximal assistance Proper positioning of operated UE when showering: Maximal assistance Positioning of UE while sleeping: Maximal assistance    Home Living Family/patient expects to be discharged to:: Private residence Living Arrangements: Alone Available Help at Discharge: Family;Available PRN/intermittently Type of Home: House Home Access: Level entry     Home Layout: One level     Bathroom Shower/Tub: Producer, television/film/video: Standard     Home Equipment: Agricultural consultant (2 wheels);Shower seat;Lift chair;Hand held shower head          Prior Functioning/Environment Prior Level of Function : Independent/Modified Independent             Mobility Comments: pt reports independence with mobility at baseline. occassionaly uses SPC. reports needing surgery on L foot because it is "all messed up" so she does not walk long distances. no falls reported ADLs Comments: Pt reports independent and driving; more recently, pt's daughter has been performing grocery shopping for the pt        OT Problem List: Decreased strength;Decreased range of motion;Impaired UE functional  use;Decreased cognition;Decreased safety awareness;Decreased knowledge of use of DME or AE;Decreased knowledge of precautions      OT Treatment/Interventions: Self-care/ADL training;DME and/or AE instruction;Therapeutic activities;Patient/family education    OT Goals(Current goals can be found in the care plan section) Acute Rehab OT Goals Patient Stated Goal: to go home OT Goal Formulation: With patient/family Time For Goal Achievement: 07/21/23 Potential to Achieve Goals: Fair  OT Frequency: Min 1X/week       AM-PAC OT "6 Clicks" Daily Activity     Outcome Measure Help from another person eating meals?: None Help from another person taking care of personal grooming?: A Little Help from another person toileting, which includes using toliet, bedpan, or urinal?: A Little Help from another person bathing (including washing, rinsing, drying)?: A Lot Help from another person to put on and taking off regular upper body clothing?: A Lot Help from another person to put on and taking off regular lower body clothing?: A Lot 6 Click Score: 16   End of Session Equipment Utilized During Treatment: Gait belt;Other (comment) (sling) Nurse Communication: Mobility status  Activity Tolerance: Patient tolerated treatment well Patient left: in chair;with call bell/phone within reach;with chair alarm set;with family/visitor present  OT Visit Diagnosis: Pain;Other abnormalities of gait and mobility (R26.89)  Time: 9811-9147 OT Time Calculation (min): 83 min Charges:  OT General Charges $OT Visit: 1 Visit OT Evaluation $OT Eval Moderate Complexity: 1 Mod OT Treatments $Self Care/Home Management : 53-67 mins  Jery Hollern L. Janise Gora, OTR/L  07/07/23, 12:51 PM

## 2023-07-07 NOTE — Progress Notes (Signed)
OT Cancellation Note  Patient Details Name: Deniz Cerullo MRN: [id number] DOB: 03/01/1941   Cancelled Treatment:    Reason Eval/Treat Not Completed: Other (comment) Orders received, OT spoke with pt who reports living alone and her daughter lives across the street. Daughter not present - will arrive at 10:30 am today for OT eval + education. OT will return at this time.   Codey Burling L. Hassie Mandt, OTR/L  07/07/23, 9:15 AM

## 2023-07-07 NOTE — Progress Notes (Signed)
   07/07/23 0839  TOC Brief Assessment  Insurance and Status Reviewed  Patient has primary care physician Yes  Home environment has been reviewed Resides alone  Prior level of function: Independent at baseline  Prior/Current Home Services No current home services  Social Determinants of Health Reivew SDOH reviewed no interventions necessary  Readmission risk has been reviewed Yes  Transition of care needs no transition of care needs at this time

## 2023-07-07 NOTE — Progress Notes (Signed)
Patient will remain in hospital today. Order changed from observation to admit to inpatient. Plan for continued OT over the weekend. LCSW can work on SNF placement options BUT goal will be that patient will be able to DC home early next week. Patient underwent same surgery on the left side last year and was able to go home, understanding precautions. We had dicussed with the patient prior to surgery that she would be discharged home and it was stated that she would have help from family.   [address] [facility], PA-C 07/07/23 1249

## 2023-07-07 NOTE — Plan of Care (Signed)
  Problem: Coping: Goal: Level of anxiety will decrease Outcome: Progressing   Problem: Pain Management: Goal: General experience of comfort will improve Outcome: Progressing

## 2023-07-07 NOTE — Care Management Obs Status (Signed)
MEDICARE OBSERVATION STATUS NOTIFICATION   Patient Details  Name: Kimberly [name] MRN: [id number] Date of Birth: [birth year]   Medicare Observation Status Notification Given:  Yes    Ewing Schlein, LCSW 07/07/2023, 11:00 AM

## 2023-07-07 NOTE — Plan of Care (Signed)
  Problem: Education: Goal: Knowledge of General Education information will improve Description: Including pain rating scale, medication(s)/side effects and non-pharmacologic comfort measures Outcome: Progressing   Problem: Activity: Goal: Risk for activity intolerance will decrease Outcome: Progressing   Problem: Pain Management: Goal: General experience of comfort will improve Outcome: Progressing

## 2023-07-07 NOTE — Progress Notes (Signed)
PATIENT ID: Kimberly [name]  MRN: [id number]  DOB/AGE:  12-01-1940 / 82 y.o.  1 Day Post-Op Procedure(s) (LRB): REVERSE SHOULDER ARTHROPLASTY (Right)  Subjective: Patient reports mild soreness in the right arm and shoulder. Reports mild headache which is improved from earlier this morning. Denies dizziness.   Objective: Vital signs in last 24 hours: Temp:  [97.5 F (36.4 C)-98.2 F (36.8 C)] 98.2 F (36.8 C) (11/08 0957) Pulse Rate:  [74-106] 74 (11/08 0957) Resp:  [16-27] 18 (11/08 0957) BP: (121-187)/(70-105) 144/77 (11/08 0957) SpO2:  [91 %-100 %] 98 % (11/08 0957)  Intake/Output from previous day: 11/07 0701 - 11/08 0700 In: 1757.5 [P.O.:300; I.V.:1057.7; IV Piggyback:399.9] Out: 150 [Blood:150]   Recent Labs    07/07/23 0344  HGB 11.6*   Recent Labs    07/07/23 0344  HCT 35.9*     Physical Exam: Neurologically intact Sensation intact distally Intact pulses distally Incision: dressing C/D/I No cellulitis present Able to move hand, wrist, and fingers RUE without difficulty  Assessment/Plan: 1 Day Post-Op Procedure(s) (LRB): REVERSE SHOULDER ARTHROPLASTY (Right)   Advance diet Up with therapy Discharge home  Weight Bearing as Tolerated (WBAT) VTE prophylaxis:  aspirin 81mg   daily  Patient has had some issues with hypertension and tachycardia post op. Seems to have responded well with labetalol. Plan for DC home today. Discharge instructions given. Follow up in office in 2 weeks.    Autrey Human L. [place], PA-C 07/07/2023, 10:14 AM

## 2023-07-07 NOTE — TOC Initial Note (Signed)
Transition of Care [facility]) - Initial/Assessment Note   Patient Details  Name: Kimberly [name] MRN: [id number] Date of Birth: 05/25/1941  Transition of [facility] CM/SW Contact:    Ewing Schlein, LCSW Phone Number: 07/07/2023, 2:58 PM  Clinical Narrative: Patient was expected to discharge home today after working with OT, but now [facility] has been consulted for SNF. TOC awaiting PT evaluation.               Expected Discharge Plan: Skilled Nursing Facility Barriers to Discharge: SNF Pending bed offer  Patient Goals and CMS Choice CMS Medicare.gov Compare Post Acute Care list provided to:: Patient Represenative (must comment) Choice offered to / list presented to : Adult Children, Patient  Expected Discharge Plan and Services In-house Referral: Clinical Social Work Post Acute Care Choice: Skilled Nursing Facility Living arrangements for the past 2 months: Apartment Expected Discharge Date: 07/07/23               DME Arranged: N/A DME Agency: NA  Prior Living Arrangements/Services Living arrangements for the past 2 months: Apartment Lives with:: Self Patient language and need for interpreter reviewed:: Yes Do you feel safe going back to the place where you live?: Yes      Need for Family Participation in Patient Care: Yes (Comment) Care giver support system in place?: Yes (comment) Criminal Activity/Legal Involvement Pertinent to Current Situation/Hospitalization: No - Comment as needed  Activities of Daily Living ADL Screening (condition at time of admission) Independently performs ADLs?: Yes (appropriate for developmental age) Is the patient deaf or have difficulty hearing?: Yes Does the patient have difficulty seeing, even when wearing glasses/contacts?: No Does the patient have difficulty concentrating, remembering, or making decisions?: No  Emotional Assessment Appearance:: Appears stated age Attitude/Demeanor/Rapport: Engaged Affect (typically observed):  Accepting Orientation: : Oriented to Self, Oriented to Place, Oriented to  Time, Oriented to Situation Alcohol / Substance Use: Not Applicable Psych Involvement: No (comment)  Admission diagnosis:  S/P reverse total shoulder arthroplasty, right [Z96.611] Patient Active Problem List   Diagnosis Date Noted   S/P reverse total shoulder arthroplasty, right 07/06/2023   Radiculopathy of lumbar region 01/12/2023   Hypertension 12/28/2022   Hyperlipidemia 12/28/2022   S/P reverse total shoulder arthroplasty, left 04/07/2022   B12 deficiency 01/25/2022   Left hip pain 01/25/2022   Pain in both lower extremities 11/24/2021   Neuropathy 11/24/2021   Pain in left ankle and joints of left foot 11/24/2021   Disorder of muscle, unspecified 11/24/2021   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/24/2021   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 AT [address] [facility] [address] [place] [address] Phone: [phone] Fax: [phone]  Social Determinants of Health (SDOH) Social History: SDOH Screenings   Food Insecurity: No Food Insecurity (07/06/2023)  Housing: Low Risk  (07/06/2023)  Transportation Needs: No Transportation Needs (07/06/2023)  Utilities: Not At Risk (07/06/2023)  Depression (PHQ2-9): Medium Risk (03/16/2023)  Tobacco Use: Low Risk  (07/06/2023)   SDOH Interventions:    Readmission Risk Interventions     No data to display

## 2023-07-08 NOTE — Progress Notes (Signed)
     Kimberly [name] is a 82 y.o. female   Orthopaedic diagnosis: Status post right reverse shoulder arthroplasty on 07/06/2023  Subjective: Patient resting comfortably in recliner.  Pain well-controlled on current regimen.  She states she feels well.  She denies shortness of breath or chest pain.  She just worked with occupational therapy.  She does not feel comfortable with discharge home today.  She lives alone.  Her daughter lives across the street but works full-time.  Sling and Iceman in place.  Objectyive: Vitals:   07/07/23 2321 07/08/23 0436  BP: (!) 166/75 (!) 171/87  Pulse: (!) 101 100  Resp: 18 18  Temp: 98 F (36.7 C) 98.2 F (36.8 C)  SpO2: 90% 92%     Exam: Awake and alert Respirations even and unlabored No acute distress  Right upper extremity shows sling and Iceman in place.  Exposed skin is benign.  She has intact sensation and motor function throughout the hand and wrist to the distal exposed.  Grip strength is intact.  Warm and well-perfused distally.  Assessment: Postop day 2 status post above, doing well   Plan: Patient doing well.  Pain is well-controlled.  We discussed discharge home to the help of family members and possibly home health once cleared by occupational therapy.  We also discussed potential for SNF placement.  She understands.  She will continue occupational therapy and we will see how she improves functionally over the next few days.  Continue therapy WBAT Pain control as needed.  81 mg aspirin for DVT prophylaxis.   Wasil Wolke J. [place], PA-C

## 2023-07-08 NOTE — Plan of Care (Signed)
  Problem: Coping: Goal: Level of anxiety will decrease Outcome: Progressing   Problem: Pain Management: Goal: General experience of comfort will improve Outcome: Progressing   Problem: Safety: Goal: Ability to remain free from injury will improve Outcome: Progressing

## 2023-07-08 NOTE — TOC Progression Note (Addendum)
Transition of Care [facility]) - Progression Note    Patient Details  Name: Kimberly [name] MRN: [id number] Date of Birth: [birth year]  Transition of Care [facility]) CM/SW Contact  Darleene Cleaver, [place] Phone Number: 07/08/2023, 4:03 PM  Clinical Narrative:     CSW spoke to patient and her daughter.  They have decided she would like to go to SNF for short term rehab.  CSW discussed with patient and daughter where she has been, and she would like [facility], [address] or [facility].  TOC has begun bed search.  6:00pm  CSW received phone call from patient and her daughter.  Patient and daughter had questions regarding the difference about how much therapy she would get if she went home with home health, outpatient therapy, or SNF.  CSW explained the differences and provided different scenarios of situations that can occur.  CSW informed patient and her daughter about the process of getting placed at a SNF.  CSW also discussed that if patient goes home with home health and she decides she needs SNF placement it can be done from home just takes longer.    CSW also explained to patient and daughter that sometimes patients will go to  SNF for a few days and then go home after a few days, because the feel comfortable returning back home.  CSW informed patient and daughter that she did pretty well with therapy today, so even if she did go to SNF, it may only be a few days.  Per patient and her daughter they are going to discuss some more and see how she is doing tomorrow to make decision.    Patient is a ortho bundle, patient has outpatient therapy, which was prearranged by [facility] case Production designer, theatre/television/film at [employer].  CSW left message with Luster Landsberg [phone] to discuss if SNF or HH can be an option.  Awaiting for a call back.  Expected Discharge Plan: Skilled Nursing Facility Barriers to Discharge: SNF Pending bed offer  Expected Discharge Plan and Services In-house  Referral: Clinical Social Work   Post Acute Care Choice: Skilled Nursing Facility Living arrangements for the past 2 months: Apartment Expected Discharge Date: 07/07/23               DME Arranged: N/A DME Agency: NA                   Social Determinants of Health (SDOH) Interventions SDOH Screenings   Food Insecurity: No Food Insecurity (07/06/2023)  Housing: Low Risk  (07/06/2023)  Transportation Needs: No Transportation Needs (07/06/2023)  Utilities: Not At Risk (07/06/2023)  Depression (PHQ2-9): Medium Risk (03/16/2023)  Tobacco Use: Low Risk  (07/06/2023)    Readmission Risk Interventions     No data to display

## 2023-07-08 NOTE — Progress Notes (Signed)
Occupational Therapy Treatment Patient Details Name: Kimberly [name] MRN: [id number] DOB: 1941/03/25 Today's Date: 07/08/2023   History of present illness Pt is an 82 year old female s/p right reverse shoulder arthroplasty on 07/06/2023   OT comments  Patient was able to engage in bathing and dressing tasks with increased A needed to carryover education for shoulder restrictions. Patient was unsteady with movements with reports of previous ankle injury. Patient would need 24/7 caregiver support in next level of care for carrying over shoulder restrictions and for safety. Patient would continue to benefit from skilled OT services at this time while admitted and after d/c to address noted deficits in order to improve overall safety and independence in ADLs.        If plan is discharge home, recommend the following:  A little help with walking and/or transfers;A lot of help with bathing/dressing/bathroom;Assistance with cooking/housework;Direct supervision/assist for medications management;Assist for transportation;Supervision due to cognitive status   Equipment Recommendations  None recommended by OT       Precautions / Restrictions Precautions Precautions: Shoulder Type of Shoulder Precautions: Sling at all times except ADL/exercise, NWB Shoulder Interventions: Shoulder sling/immobilizer;At all times;Off for dressing/bathing/exercises Precaution Booklet Issued: Yes (comment) Required Braces or Orthoses: Sling Restrictions Weight Bearing Restrictions: Yes RUE Weight Bearing: Non weight bearing              ADL either performed or assessed with clinical judgement   ADL Overall ADL's : Needs assistance/impaired     Grooming: Wash/dry face;Brushing hair;Wash/dry hands;Oral care;Minimal assistance;Standing Grooming Details (indicate cue type and reason): at sink in bathroom with increased time. Upper Body Bathing: Moderate assistance;Sitting Upper Body Bathing Details  (indicate cue type and reason): with increased education on how to [facility] shoulder restrictions with continued cues needed during session.             Toilet Transfer: Contact guard assist;Ambulation;Cueing for sequencing;Cueing for safety   Toileting- Clothing Manipulation and Hygiene: Contact guard assist;Cueing for sequencing;Cueing for safety;Sitting/lateral lean                Cognition Arousal: Alert Behavior During Therapy: WFL for tasks assessed/performed Overall Cognitive Status: Within Functional Limits for tasks assessed       General Comments: patient had no carryover from previous sessions for shoulder education. appeared to have decreased insight to deficits and ammount of assistance needed for ADLs.           Shoulder Instructions Shoulder Instructions Method for sponge bathing under operated UE: Moderate assistance Donning/doffing sling/immobilizer: Maximal assistance Correct positioning of sling/immobilizer: Maximal assistance ROM for elbow, wrist and digits of operated UE: Moderate assistance Sling wearing schedule (on at all times/off for ADL's): Maximal assistance Positioning of UE while sleeping: Maximal assistance          Pertinent Vitals/ Pain       Pain Assessment Pain Assessment: Faces Faces Pain Scale: Hurts a little bit Pain Location: shoulder Pain Descriptors / Indicators: Discomfort, Grimacing      Frequency  Min 1X/week        Progress Toward Goals  OT Goals(current goals can now be found in the care plan section)  Progress towards OT goals: Progressing toward goals     Plan         AM-PAC OT "6 Clicks" Daily Activity     Outcome Measure   Help from another person eating meals?: None Help from another person taking care of personal grooming?: A Little Help from another person toileting,  which includes using toliet, bedpan, or urinal?: A Little Help from another person bathing (including washing, rinsing,  drying)?: A Lot Help from another person to put on and taking off regular upper body clothing?: A Lot Help from another person to put on and taking off regular lower body clothing?: A Lot 6 Click Score: 16    End of Session Equipment Utilized During Treatment: Gait belt;Other (comment) (sling)  OT Visit Diagnosis: Pain;Other abnormalities of gait and mobility (R26.89)   Activity Tolerance Patient tolerated treatment well   Patient Left in chair;with call bell/phone within reach;with chair alarm set   Nurse Communication Mobility status        Time: 9147-8295 OT Time Calculation (min): 28 min  Charges: OT General Charges $OT Visit: 1 Visit OT Treatments $Self Care/Home Management : 23-37 mins  Rosalio Loud, MS Acute Rehabilitation Department Office# [phone]   Selinda Flavin 07/08/2023, 10:13 AM

## 2023-07-08 NOTE — NC FL2 (Signed)
[place] MEDICAID FL2 LEVEL OF CARE FORM     IDENTIFICATION  Patient Name: Kimberly [name] Birthdate: 01-05-1941 Sex: female Admission Date (Current Location): 07/06/2023  [facility] and [place] Number:  Producer, television/film/video and Address:  [facility],  [address] 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   Radiculopathy of lumbar region 01/12/2023   Hypertension 12/28/2022   Hyperlipidemia 12/28/2022   S/P reverse total shoulder arthroplasty, 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/2022   B12 deficiency 01/25/2022   Left hip pain 01/25/2022   Pain in both lower extremities 11/24/2021   Neuropathy 11/24/2021   Pain in left ankle and joints of left foot 11/24/2021   Disorder of muscle, unspecified 11/24/2021   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/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   Chronic low back pain 03/25/2019   Gait abnormality 05/07/2018   Restless leg syndrome 07/28/2017   Paresthesia 07/04/2017   Restless leg 07/04/2017    Orientation RESPIRATION BLADDER Height & Weight     Self, Time, Place  Normal Continent Weight: 159 lb (72.1 kg) Height:  5\' 3"  (160 cm)  BEHAVIORAL SYMPTOMS/MOOD NEUROLOGICAL BOWEL NUTRITION STATUS      Continent Diet  AMBULATORY STATUS COMMUNICATION OF NEEDS Skin   Limited Assist Verbally Surgical wounds                       Personal Care Assistance Level of Assistance  Bathing, Feeding, Dressing Bathing Assistance: Limited assistance Feeding assistance:  Independent Dressing Assistance: Limited assistance     Functional Limitations Info  Sight, Hearing, Speech Sight Info: Adequate Hearing Info: Adequate Speech Info: Adequate    SPECIAL CARE FACTORS FREQUENCY  PT (By licensed PT), OT (By licensed OT)     PT Frequency: Minimum 5x a week OT Frequency: Minimum 5x a week            Contractures Contractures Info: Not present    Additional Factors Info  Code Status, Allergies Code Status Info: Full Code Allergies Info: Amoxicillin  Ciprofloxacin  Adhesive (Tape)  Benzoyl Peroxide  Metronidazole  Oxycodone  Betadine (Povidone Iodine)  Hydrocodone  Sulfa Antibiotics Psychotropic Info: See discharge summary         Current Medications (07/08/2023):  This is the current hospital active medication list Current Facility-Administered Medications  Medication Dose Route Frequency Provider Last Rate Last Admin   0.9 %  sodium chloride infusion   Intravenous Continuous Porterfield, Amber, PA-C 50 mL/hr at 07/06/23 2207 New Bag at 07/06/23 2207   acetaminophen (TYLENOL) tablet 325-650 mg  325-650 mg Oral Q6H PRN Fredia Sorrow, PA-C   650 mg at 07/07/23 1852   alum & mag hydroxide-simeth (MAALOX/MYLANTA) 200-200-20 MG/5ML suspension 30 mL  30 mL Oral Q4H PRN Porterfield, Amber, PA-C       amLODipine (NORVASC) tablet 2.5 mg  2.5 mg Oral q morning Porterfield, Amber, PA-C   2.5 mg at 07/08/23 0901   aspirin EC tablet 81 mg  81 mg Oral Daily Porterfield, [facility], PA-C  81 mg at 07/08/23 0902   atorvastatin (LIPITOR) tablet 20 mg  20 mg Oral QHS Danford Bad, RPH   20 mg at 07/07/23 2144   bisacodyl (DULCOLAX) EC tablet 5 mg  5 mg Oral Daily PRN Porterfield, Amber, PA-C       diphenhydrAMINE (BENADRYL) 12.5 MG/5ML elixir 12.5-25 mg  12.5-25 mg Oral Q4H PRN Porterfield, Amber, PA-C       diphenhydrAMINE (BENADRYL) capsule 25-50 mg  25-50 mg Oral Q6H PRN Porterfield, Amber, PA-C       docusate sodium (COLACE) capsule 100 mg  100 mg Oral BID  Porterfield, Amber, PA-C   100 mg at 07/08/23 0901   DULoxetine (CYMBALTA) DR capsule 60 mg  60 mg Oral Daily Porterfield, Amber, PA-C   60 mg at 07/08/23 0901   gabapentin (NEURONTIN) capsule 300 mg  300 mg Oral QID Porterfield, Amber, PA-C   300 mg at 07/08/23 1354   HYDROmorphone (DILAUDID) injection 0.5-1 mg  0.5-1 mg Intravenous Q4H PRN Porterfield, Amber, PA-C       menthol-cetylpyridinium (CEPACOL) lozenge 3 mg  1 lozenge Oral PRN Porterfield, Amber, PA-C       Or   phenol (CHLORASEPTIC) mouth spray 1 spray  1 spray Mouth/Throat PRN Porterfield, Amber, PA-C       methocarbamol (ROBAXIN) tablet 500 mg  500 mg Oral Q6H PRN Porterfield, Amber, PA-C       Or   methocarbamol (ROBAXIN) injection 500 mg  500 mg Intravenous Q6H PRN Porterfield, Amber, PA-C       ondansetron (ZOFRAN) tablet 4 mg  4 mg Oral Q6H PRN Porterfield, Amber, PA-C       Or   ondansetron (ZOFRAN) injection 4 mg  4 mg Intravenous Q6H PRN Porterfield, Amber, PA-C       polyethylene glycol (MIRALAX / GLYCOLAX) packet 17 g  17 g Oral Daily PRN Porterfield, Amber, PA-C       pramipexole (MIRAPEX) tablet 0.25 mg  0.25 mg Oral QHS Porterfield, Amber, PA-C   0.25 mg at 07/07/23 2144   sodium phosphate (FLEET) enema 1 enema  1 enema Rectal Once PRN Porterfield, Amber, PA-C       traMADol (ULTRAM) tablet 50-100 mg  50-100 mg Oral Q6H PRN Porterfield, Amber, PA-C   50 mg at 07/08/23 0902   traZODone (DESYREL) tablet 50 mg  50 mg Oral QHS Porterfield, Amber, PA-C   50 mg at 07/07/23 2144     Discharge Medications: Please see discharge summary for a list of discharge medications.  Relevant Imaging Results:  Relevant Lab Results:   Additional Information SSN [id number]  Darleene Cleaver, LCSW

## 2023-07-08 NOTE — Plan of Care (Signed)
  Problem: Education: Goal: Knowledge of General Education information will improve Description: Including pain rating scale, medication(s)/side effects and non-pharmacologic comfort measures Outcome: Progressing   Problem: Activity: Goal: Risk for activity intolerance will decrease Outcome: Progressing   Problem: Pain Management: Goal: General experience of comfort will improve Outcome: Progressing

## 2023-07-08 NOTE — Evaluation (Signed)
Physical Therapy Evaluation Patient Details Name: Kimberly [name] MRN: [id number] DOB: 1941/06/17 Today's Date: 07/08/2023  History of Present Illness  Pt is an 82 year old female s/p right reverse shoulder arthroplasty on 07/06/2023. PMH: r RTSA, L TKA, Covid, cognitive impairment, TLIF  Clinical Impression  Pt admitted with above diagnosis.  Pt pleasant and cooperative; able to recall no ROM of shoulder and that she should have sling in place from OT session earlier today;  amb ~ 200' with CGA, no overt LOB. Pt reporting she feels she is near her baseline with amb given L ankle deficits at baseline.continue to follow in acute setting  Pt currently with functional limitations due to the deficits listed below (see PT Problem List). Pt will benefit from acute skilled PT to increase their independence and safety with mobility to allow discharge.           If plan is discharge home, recommend the following: Assistance with cooking/housework;Assist for transportation;Help with stairs or ramp for entrance;A little help with bathing/dressing/bathroom;A little help with walking and/or transfers   Can travel by private vehicle        Equipment Recommendations None recommended by PT  Recommendations for Other Services       Functional Status Assessment Patient has had a recent decline in their functional status and demonstrates the ability to make significant improvements in function in a reasonable and predictable amount of time.     Precautions / Restrictions Precautions Precautions: Shoulder Type of Shoulder Precautions: Sling at all times except ADL/exercise, NWB Shoulder Interventions: Shoulder sling/immobilizer;At all times;Off for dressing/bathing/exercises Precaution Booklet Issued: Yes (comment) Required Braces or Orthoses: Sling Restrictions Weight Bearing Restrictions: Yes RUE Weight Bearing: Non weight bearing      Mobility  Bed Mobility               General  bed mobility comments: NT in recliner start/end session    Transfers Overall transfer level: Needs assistance Equipment used: None Transfers: Sit to/from Stand Sit to Stand: Contact guard assist           General transfer comment: for safety, no LOB    Ambulation/Gait Ambulation/Gait assistance: Contact guard assist Gait Distance (Feet): 200 Feet Assistive device: None Gait Pattern/deviations: Step-through pattern, Decreased stride length, Drifts right/left       General Gait Details: mild drifiting, unsteady however no overt LOB  Stairs            Wheelchair Mobility     Tilt Bed    Modified Rankin (Stroke Patients Only)       Balance Overall balance assessment: Needs assistance           Standing balance-Leahy Scale: Fair Standing balance comment: able to amb with close guarding for safety, no overt  LOB             High level balance activites: Turns, Head turns High Level Balance Comments: no LOB, CGA for safety             Pertinent Vitals/Pain Pain Assessment Pain Assessment: No/denies pain Pain Intervention(s): Monitored during session, Ice applied    Home Living Family/patient expects to be discharged to:: Private residence Living Arrangements: Alone Available Help at Discharge: Family;Available PRN/intermittently Type of Home: House Home Access: Level entry       Home Layout: One level Home Equipment: Agricultural consultant (2 wheels);Shower seat;Lift chair;Hand held shower head;Cane - single point Additional Comments: dtr lives across the street and helps prn per pt  Prior Function               Mobility Comments: pt reports independence with mobility at baseline. occassionaly uses SPC. reports needing surgery on L foot because it is "all messed up" so she does not walk long distances. no falls per pt       Extremity/Trunk Assessment   Upper Extremity Assessment Upper Extremity Assessment: Defer to OT evaluation     Lower Extremity Assessment Lower Extremity Assessment: RLE deficits/detail RLE Deficits / Details: WFL LLE Deficits / Details: reports needing surgery on L foot because it is "all messed up" so she does not walk long distances; Hip and knee grossly [facility]       Communication   Communication Communication: No apparent difficulties  Cognition Arousal: Alert Behavior During Therapy: WFL for tasks assessed/performed Overall Cognitive Status: Within Functional Limits for tasks assessed (hx of "mild cognitive impairment")                                 General Comments: pt was able to recall that she is not to move her R shoulder and that she is to have sling in place - unable to recall OT session earlier but is beginning to demonstrate some carryover from education        General Comments      Exercises     Assessment/Plan    PT Assessment Patient needs continued PT services  PT Problem List Decreased balance;Decreased mobility;Decreased activity tolerance;Pain;Decreased knowledge of precautions;Decreased cognition       PT Treatment Interventions DME instruction;Gait training;Functional mobility training;Therapeutic activities;Therapeutic exercise;Patient/family education    PT Goals (Current goals can be found in the Care Plan section)  Acute Rehab PT Goals Patient Stated Goal: to go home PT Goal Formulation: With patient Time For Goal Achievement: 07/15/23 Potential to Achieve Goals: Good    Frequency Min 6X/week     Co-evaluation               AM-PAC PT "6 Clicks" Mobility  Outcome Measure Help needed turning from your back to your side while in a flat bed without using bedrails?: A Little Help needed moving from lying on your back to sitting on the side of a flat bed without using bedrails?: A Little Help needed moving to and from a bed to a chair (including a wheelchair)?: A Little Help needed standing up from a chair using your arms (e.g.,  wheelchair or bedside chair)?: A Little Help needed to walk in hospital room?: A Little Help needed climbing 3-5 steps with a railing? : A Little 6 Click Score: 18    End of Session Equipment Utilized During Treatment: Gait belt Activity Tolerance: Patient tolerated treatment well Patient left: in chair;with call bell/phone within reach;with chair alarm set   PT Visit Diagnosis: Other abnormalities of gait and mobility (R26.89)    Time: 1610-9604 PT Time Calculation (min) (ACUTE ONLY): 12 min   Charges:   PT Evaluation $PT Eval Low Complexity: 1 Low   PT General Charges $$ ACUTE PT VISIT: 1 Visit         Layton Naves, PT  Acute Rehab Dept [facility]) [phone]  07/08/2023   [facility] 07/08/2023, 11:49 AM

## 2023-07-09 NOTE — Progress Notes (Signed)
Occupational Therapy Treatment Patient Details Name: Kimberly [name] MRN: [id number] DOB: [birth year] Today's Date: 07/09/2023   History of present illness Pt is an 82 year old female s/p right reverse shoulder arthroplasty on 07/06/2023. PMH: r RTSA, L TKA, Covid, cognitive impairment, TLIF   OT comments  Patient did not remember education on UE exercises or recognize hand out when provided. Patient needed consistent cues for proper positioning of UE to engage in exercises for hand wrist and elbow. Patient continues to need  cues to maintain shoulder restrictions with ADL tasks. Patient's discharge plan remains appropriate at this time. OT will continue to follow acutely.        If plan is discharge home, recommend the following:  A little help with walking and/or transfers;A lot of help with bathing/dressing/bathroom;Assistance with cooking/housework;Direct supervision/assist for medications management;Assist for transportation;Supervision due to cognitive status   Equipment Recommendations  None recommended by OT       Precautions / Restrictions Precautions Precautions: Shoulder Type of Shoulder Precautions: Sling at all times except ADL/exercise, NWB Shoulder Interventions: Shoulder sling/immobilizer;At all times;Off for dressing/bathing/exercises Precaution Booklet Issued: Yes (comment) Required Braces or Orthoses: Sling Restrictions Weight Bearing Restrictions: Yes RUE Weight Bearing: Non weight bearing              ADL either performed or assessed with clinical judgement   ADL Overall ADL's : Needs assistance/impaired     Grooming: Wash/dry face;Brushing hair;Wash/dry hands;Oral care;Minimal assistance;Standing Grooming Details (indicate cue type and reason): at sink in bathroom with increased time. cues to keep shoulder down                 Toilet Transfer: Contact guard assist;Ambulation;Cueing for sequencing;Cueing for safety   Toileting- Clothing  Manipulation and Hygiene: Contact guard assist;Cueing for sequencing;Cueing for safety;Sit to/from stand                Cognition Arousal: Alert Behavior During Therapy: [facility] for tasks assessed/performed             General Comments: patient had no memory of participation in UE exercises prior day.           Shoulder Instructions Shoulder Instructions Donning/doffing shirt without moving shoulder: Minimal assistance Donning/doffing sling/immobilizer: Moderate assistance (with cues to find stap v.s reaching posteriorly to pull at sling) ROM for elbow, wrist and digits of operated UE: Moderate assistance (does not remember completing these yesterday. needed education on each exercise completed.)       Frequency  Min 1X/week        Progress Toward Goals  OT Goals(current goals can now be found in the care plan section)  Progress towards OT goals: Progressing toward goals     Plan         AM-PAC OT "6 Clicks" Daily Activity     Outcome Measure   Help from another person eating meals?: None Help from another person taking care of personal grooming?: A Little Help from another person toileting, which includes using toliet, bedpan, or urinal?: A Little Help from another person bathing (including washing, rinsing, drying)?: A Lot Help from another person to put on and taking off regular upper body clothing?: A Lot Help from another person to put on and taking off regular lower body clothing?: A Little 6 Click Score: 17    End of Session Equipment Utilized During Treatment: Other (comment) (sling)  OT Visit Diagnosis: Pain;Other abnormalities of gait and mobility (R26.89)   Activity Tolerance Patient tolerated treatment well  Patient Left in chair;with call bell/phone within reach;with chair alarm set   Nurse Communication Mobility status        Time: 1610-9604 OT Time Calculation (min): 24 min  Charges: OT General Charges $OT Visit: 1 Visit OT  Treatments $Self Care/Home Management : 23-37 mins  Rosalio Loud, MS Acute Rehabilitation Department Office# [phone]   Selinda Flavin 07/09/2023, 12:43 PM

## 2023-07-09 NOTE — Progress Notes (Signed)
     Kimberly [name] is a 82 y.o. 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  Pain well controlled on current regimen.  Continues with ongoing discussion with daughter and case management regarding home versus SNF.  Seems unsure what she may qualify for.  She is not sure if she will be safe at home as she lives alone but is hoping to go home.  No postoperative bowel movement yet.  Passing flatus.  No abdominal pain. She is on bowel regimen. No shortness of breath or chest pain.  Objectyive: Vitals:   07/08/23 2209 07/09/23 0619  BP: 133/66 (!) 170/83  Pulse: 99 96  Resp: 18 15  Temp: 98.2 F (36.8 C) 98.6 F (37 C)  SpO2: 92% 95%     Exam: Awake and alert Respirations even and unlabored No acute distress  Right upper extremity shows sling in place.  Surgical dressing clean, dry, and intact.  She maintains intact sensation and motor function throughout the hand and wrist.  Warm and well perfused distally.  Assessment: Postop day 3 status post above, doing well  Plan: Patient doing well.  Disposition remains home versus SNF.  Unsure what she may qualify for. Hopefully discharge tomorrow.  She will continue work with therapy today.  Pain control as needed.   81 mg aspirin for prophylaxis  Trigg Delarocha J. [place], PA-C

## 2023-07-09 NOTE — Plan of Care (Signed)
  Problem: Education: Goal: Knowledge of General Education information will improve Description: Including pain rating scale, medication(s)/side effects and non-pharmacologic comfort measures Outcome: Progressing   Problem: Health Behavior/Discharge Planning: Goal: Ability to manage health-related needs will improve Outcome: Progressing   Problem: Clinical Measurements: Goal: Ability to maintain clinical measurements within normal limits will improve Outcome: Progressing Goal: Will remain free from infection Outcome: Progressing Goal: Diagnostic test results will improve Outcome: Progressing Goal: Respiratory complications will improve Outcome: Progressing Goal: Cardiovascular complication will be avoided Outcome: Progressing   Problem: Activity: Goal: Risk for activity intolerance will decrease Outcome: Progressing   Problem: Nutrition: Goal: Adequate nutrition will be maintained Outcome: Progressing   Problem: Coping: Goal: Level of anxiety will decrease Outcome: Progressing   Problem: Elimination: Goal: Will not experience complications related to bowel motility Outcome: Progressing Goal: Will not experience complications related to urinary retention Outcome: Progressing   Problem: Pain Management: Goal: General experience of comfort will improve Outcome: Progressing   Problem: Safety: Goal: Ability to remain free from injury will improve Outcome: Progressing   Problem: Skin Integrity: Goal: Risk for impaired skin integrity will decrease Outcome: Progressing   Problem: Education: Goal: Knowledge of the prescribed therapeutic regimen will improve Outcome: Progressing Goal: Understanding of activity limitations/precautions following surgery will improve Outcome: Progressing Goal: Individualized Educational Video(s) Outcome: Progressing   Problem: Activity: Goal: Ability to tolerate increased activity will improve Outcome: Progressing   Problem: Pain  Management: Goal: Pain level will decrease with appropriate interventions Outcome: Progressing   Problem: Education: Goal: Knowledge of the prescribed therapeutic regimen will improve Outcome: Progressing Goal: Understanding of activity limitations/precautions following surgery will improve Outcome: Progressing Goal: Individualized Educational Video(s) Outcome: Progressing   Problem: Activity: Goal: Ability to tolerate increased activity will improve Outcome: Progressing   Problem: Pain Management: Goal: Pain level will decrease with appropriate interventions Outcome: Progressing

## 2023-07-09 NOTE — TOC Progression Note (Signed)
Transition of Care [facility]) - Progression Note    Patient Details  Name: Kimberly [name] MRN: [id number] Date of Birth: 10-06-1940  Transition of [facility]) CM/SW Contact  Georgie Chard, LCSW Phone Number: 07/09/2023, 10:37 AM  Clinical Narrative:    CSW spoke to patient and daughter. AT this time family would like HH [facility] through [facility]. CSW will arrange [facility] for family. At this time there are no further needs.    Expected Discharge Plan: Skilled Nursing Facility Barriers to Discharge: SNF Pending bed offer  Expected Discharge Plan and Services In-house Referral: Clinical Social Work   Post Acute Care Choice: Skilled Nursing Facility Living arrangements for the past 2 months: Apartment Expected Discharge Date: 07/07/23               DME Arranged: N/A DME Agency: NA                   Social Determinants of Health (SDOH) Interventions SDOH Screenings   Food Insecurity: No Food Insecurity (07/06/2023)  Housing: Low Risk  (07/06/2023)  Transportation Needs: No Transportation Needs (07/06/2023)  Utilities: Not At Risk (07/06/2023)  Depression (PHQ2-9): Medium Risk (03/16/2023)  Tobacco Use: Low Risk  (07/06/2023)    Readmission Risk Interventions     No data to display

## 2023-07-09 NOTE — Progress Notes (Signed)
Physical Therapy Treatment Patient Details Name: Kimberly [name] MRN: [id number] DOB: May 01, 1941 Today's Date: 07/09/2023   History of Present Illness Pt is an 82 year old female s/p right reverse shoulder arthroplasty on 07/06/2023. PMH: r RTSA, L TKA, Covid, cognitive impairment, TLIF    PT Comments  Pt progressing well this session, incr amb distance/tolerance. Improving stability with SPC. Continue to follow. pt states she wants to go home; if home will benefit from HHPT, will need assist from dtr and per ortho PA this was the plan arranged prior to surgery.     If plan is discharge home, recommend the following: Assistance with cooking/housework;Assist for transportation;Help with stairs or ramp for entrance;A little help with bathing/dressing/bathroom;A little help with walking and/or transfers   Can travel by private vehicle        Equipment Recommendations  None recommended by PT    Recommendations for Other Services       Precautions / Restrictions Precautions Precautions: Shoulder Type of Shoulder Precautions: Sling at all times except ADL/exercise, NWB Shoulder Interventions: Shoulder sling/immobilizer;At all times;Off for dressing/bathing/exercises Precaution Booklet Issued: Yes (comment) Required Braces or Orthoses: Sling Restrictions Weight Bearing Restrictions: Yes RUE Weight Bearing: Non weight bearing     Mobility  Bed Mobility                    Transfers Overall transfer level: Needs assistance Equipment used: None Transfers: Sit to/from Stand Sit to Stand: Contact guard assist           General transfer comment: for safety, no LOB    Ambulation/Gait Ambulation/Gait assistance: Contact guard assist, Supervision Gait Distance (Feet): 350 Feet Assistive device: Straight cane Gait Pattern/deviations: Step-through pattern, Decreased stride length       General Gait Details: mildly unsteady, stability improved with distance.  intermittent cues for [facility] placement   Stairs             Wheelchair Mobility     Tilt Bed    Modified Rankin (Stroke Patients Only)       Balance             Standing balance-Leahy Scale: Fair Standing balance comment: able to amb with close guarding for safety, no overt  LOB               High Level Balance Comments: no LOB, CGA for safety            Cognition Arousal: Alert Behavior During Therapy: WFL for tasks assessed/performed                                   General Comments: pt able to recall partial shoulder precautions  from OT session, did not recall exercises; (dx mild cognitive impairment)        Exercises      General Comments        Pertinent Vitals/Pain Pain Assessment Pain Assessment: No/denies pain    Home Living                          Prior Function            PT Goals (current goals can now be found in the care plan section) Acute Rehab PT Goals Patient Stated Goal: to go home PT Goal Formulation: With patient Time For Goal Achievement: 07/15/23 Potential to Achieve Goals: Good Progress towards  PT goals: Progressing toward goals    Frequency    Min 6X/week      PT Plan      Co-evaluation              AM-PAC PT "6 Clicks" Mobility   Outcome Measure  Help needed turning from your back to your side while in a flat bed without using bedrails?: A Little Help needed moving from lying on your back to sitting on the side of a flat bed without using bedrails?: A Little Help needed moving to and from a bed to a chair (including a wheelchair)?: A Little Help needed standing up from a chair using your arms (e.g., wheelchair or bedside chair)?: A Little Help needed to walk in hospital room?: A Little Help needed climbing 3-5 steps with a railing? : A Little 6 Click Score: 18    End of Session Equipment Utilized During Treatment: Gait belt Activity Tolerance: Patient  tolerated treatment well Patient left: in chair;with call bell/phone within reach;with chair alarm set   PT Visit Diagnosis: Other abnormalities of gait and mobility (R26.89)     Time: 9629-5284 PT Time Calculation (min) (ACUTE ONLY): 24 min  Charges:    $Gait Training: 23-37 mins PT General Charges $$ ACUTE PT VISIT: 1 Visit                     Tonio Seider, PT  Acute Rehab Dept [facility]) [phone]  07/09/2023    [facility] 07/09/2023, 1:56 PM

## 2023-07-10 ENCOUNTER — Encounter (HOSPITAL_COMMUNITY): Payer: Self-pay | Admitting: Orthopedic Surgery

## 2023-07-10 MED ORDER — TRAMADOL HCL 50 MG PO TABS: 50.0000 mg | ORAL_TABLET | Freq: Four times a day (QID) | ORAL | 0 refills | Status: DC | PRN

## 2023-07-10 NOTE — Progress Notes (Signed)
Occupational Therapy Treatment Patient Details Name: Kimberly [name] MRN: [id number] DOB: 27-Sep-1940 Today's Date: 07/10/2023   History of present illness Pt is an 82 year old female s/p right reverse shoulder arthroplasty on 07/06/2023. PMH: r RTSA, L TKA, Covid, cognitive impairment, TLIF   OT comments  s/p shoulder replacement without functional use of RUE secondary to effects of surgery and interscalene block and shoulder precautions. Therapist provided education and instruction to patient and daughter in regards to exercises, precautions, positioning, donning upper extremity clothing and bathing while maintaining shoulder precautions, ice and edema management, use of ice machine and donning/doffing sling. Patient and daughter verbalized understanding and demonstrated as needed.  Patient to follow up with MD for further therapy needs.        If plan is discharge home, recommend the following:  A little help with walking and/or transfers;A lot of help with bathing/dressing/bathroom;Assistance with cooking/housework;Direct supervision/assist for medications management;Assist for transportation;Supervision due to cognitive status   Equipment Recommendations  None recommended by OT       Precautions / Restrictions Precautions Precautions: Shoulder Type of Shoulder Precautions: Sling at all times except ADL/exercise, NWB Shoulder Interventions: Shoulder sling/immobilizer;At all times;Off for dressing/bathing/exercises Precaution Booklet Issued: Yes (comment) Required Braces or Orthoses: Sling Restrictions Weight Bearing Restrictions: Yes RUE Weight Bearing: Non weight bearing              ADL either performed or assessed with clinical judgement   ADL Overall ADL's : Needs assistance/impaired             General ADL Comments: shoulder restrictions and ADL participation were reviewed with patient and daughter in room. daughter and patient reported having no further  questions at this time      Cognition Arousal: Alert Behavior During Therapy: WFL for tasks assessed/performed         General Comments: endorses having decreased memory during session.           Shoulder Instructions Shoulder Instructions Donning/doffing shirt without moving shoulder: Caregiver independent with task Method for sponge bathing under operated UE: Caregiver independent with task Donning/doffing sling/immobilizer: Caregiver independent with task ROM for elbow, wrist and digits of operated UE: Caregiver independent with task Proper positioning of operated UE when showering: Caregiver independent with task Positioning of UE while sleeping: Caregiver independent with task     General Comments      Pertinent Vitals/ Pain       Pain Assessment Pain Assessment: No/denies pain         Frequency  Min 1X/week        Progress Toward Goals  OT Goals(current goals can now be found in the care plan section)  Progress towards OT goals: Progressing toward goals     Plan         AM-PAC OT "6 Clicks" Daily Activity     Outcome Measure   Help from another person eating meals?: None Help from another person taking care of personal grooming?: A Little Help from another person toileting, which includes using toliet, bedpan, or urinal?: A Little Help from another person bathing (including washing, rinsing, drying)?: A Lot Help from another person to put on and taking off regular upper body clothing?: A Lot Help from another person to put on and taking off regular lower body clothing?: A Little 6 Click Score: 17    End of Session Equipment Utilized During Treatment: Other (comment) (sling)  OT Visit Diagnosis: Pain;Other abnormalities of gait and mobility (R26.89)  Activity Tolerance Patient tolerated treatment well   Patient Left in chair;with call bell/phone within reach;with family/visitor present   Nurse Communication Other (comment) (questions on  medications listed in d/c summary from nurse)        Time: 8119-1478 OT Time Calculation (min): 16 min  Charges: OT General Charges $OT Visit: 1 Visit OT Treatments $Self Care/Home Management : 8-22 mins  Rosalio Loud, MS Acute Rehabilitation Department Office# [phone]   Selinda Flavin 07/10/2023, 11:06 AM

## 2023-07-10 NOTE — Progress Notes (Addendum)
PATIENT ID: Kimberly [name]  MRN: [id number]  DOB/AGE:  1940/09/11 / 82 y.o.  4 Days Post-Op Procedure(s) (LRB): REVERSE SHOULDER ARTHROPLASTY (Right)  Subjective: Patient reports mild discomfort in the right shoulder. She reports frustration about still being in the hospital and states desire to go home and not to SNF. Patient states that he daughter lives across the street and works from home and is readily available. Patient is able to verbalize that she must wear the sling and is only allowed to do gentle hand, wrist, and elbow motion on the RUE.   Objective: Vital signs in last 24 hours: Temp:  [98.2 F (36.8 C)-98.8 F (37.1 C)] 98.3 F (36.8 C) (11/11 0550) Pulse Rate:  [93-102] 93 (11/11 0550) Resp:  [16-18] 16 (11/11 0550) BP: (141-148)/(70-78) 143/71 (11/11 0550) SpO2:  [94 %-96 %] 95 % (11/11 0550)  Intake/Output from previous day: 11/10 0701 - 11/11 0700 In: 930 [P.O.:930] Out: -   Physical Exam: Neurologically intact Sensation intact distally Intact pulses distally Incision: dressing C/D/I No cellulitis present Able to move right hand, wrist, elbow without difficulty Sling on RUE  Assessment/Plan: 4 Days Post-Op Procedure(s) (LRB): REVERSE SHOULDER ARTHROPLASTY (Right)   Advance diet Discharge home therapy, and Renee our NCM Non Weight Bearing (NWB) VTE prophylaxis:  aspirin 81mg  daily  Patient desire to go home. I do feel along with Dr Ave Filter that this is appropriate. Patient has been able to discuss her shoulder precautions with me. Daughter is readily available as she was last year when the patient's left Rev TSA was done. She is able to walk 350 feet. HHPT does not seem appropriate. Renee Angulli states that patient will not qualify for HHPT and patient does not want extra cost. Plan for DC home today. Rx sent to pharmacy. Disregard Rx on chart   Said Rueb L. [place], PA-C 07/10/2023, 8:15 AM

## 2023-07-11 ENCOUNTER — Telehealth: Payer: Self-pay

## 2023-07-11 NOTE — Transitions of Care (Post Inpatient/ED Visit) (Signed)
07/11/2023  Name: Kimberly [name] MRN: [id number] DOB: [birth year]  Today's TOC FU Call Status: Today's TOC FU Call Status:: Successful TOC FU Call Completed TOC FU Call Complete Date: 07/11/23 Patient's Name and Date of Birth confirmed.  Transition Care Management Follow-up Telephone Call Date of Discharge: 07/10/23 Discharge Facility: [facility]) Type of Discharge: Inpatient Admission Primary Inpatient Discharge Diagnosis:: "s/p reverse total shoulder arthroplasty,right" How have you been since you were released from the hospital?: Better (Spoke w/ daughter-states pt dong well-no acute issues or sxs-hasn't complained of pain or taken any Ultram yet, rested well, surg drsg intact, appetite WNl for pt, No BM since surgery-reviewed mgmt of sxs-will take med to help) Any questions or concerns?: No  Items Reviewed: Did you receive and understand the discharge instructions provided?: Yes Medications obtained,verified, and reconciled?: Yes (Medications Reviewed) Any new allergies since your discharge?: No Dietary orders reviewed?: Yes Type of Diet Ordered:: low salt/heart healthy Do you have support at home?: Yes People in Home: child(ren), adult Name of Support/Comfort Primary Source: daughter-Carol  Medications Reviewed Today: Medications Reviewed Today     Reviewed by Charlyn Minerva, RN (Registered Nurse) on 07/11/23 at 1554  Med List Status: <None>   Medication Order Taking? Sig Documenting Provider Last Dose Status Informant  acetaminophen (TYLENOL) 650 MG CR tablet [id number] Yes Take 650-1,300 mg by mouth See admin instructions. Take 650 mg in the AM, 650 mg at noon, and 1300 mg at bedtime. [provider] Taking Active Self  amLODipine (NORVASC) 2.5 MG tablet [id number] Yes Take 2.5 mg by mouth every morning. [provider] Taking Active Self  aspirin EC 81 MG tablet [id number] Yes Take 1 tablet (81 mg total) by mouth daily. Swallow  whole. [place], Hospital doctor, PA-C Taking Active   diphenhydrAMINE (BENADRYL) 25 mg capsule [id number] Yes Take 1-2 capsules (25-50 mg total) by mouth every 6 (six) hours as needed for itching (Dose with px medications). McKenzie, Eilene Ghazi, PA-C Taking Active Self  DULoxetine (CYMBALTA) 60 MG capsule [id number] Yes TAKE 1 CAPSULE BY MOUTH EVERY DAY Levert Feinstein, MD Taking Active Self    Discontinued 09/03/20 1354   gabapentin (NEURONTIN) 300 MG capsule [id number] Yes Take 300 mg by mouth 4 (four) times daily. [provider] Taking Active Self    Discontinued 09/03/20 1354   omeprazole (PRILOSEC) 40 MG capsule [id number] Yes Take 40 mg by mouth in the morning. [provider] Taking Active Self  Polyethyl Glycol-Propyl Glycol (SYSTANE OP) [id number] Yes Place 1 drop into both eyes every 6 (six) hours as needed (dry eyes). [provider] Taking Active Self  pramipexole (MIRAPEX) 0.25 MG tablet [id number] Yes Take 0.25 mg by mouth at bedtime. [provider] Taking Active Self  simvastatin (ZOCOR) 20 MG tablet [id number] Yes Take 1 tablet (20 mg total) by mouth at bedtime. [place], Hospital doctor, PA-C Taking Active   traMADol (ULTRAM) 50 MG tablet [id number]  Take 1-2 tablets (50-100 mg total) by mouth every 6 (six) hours as needed for moderate pain (pain score 4-6). Porterfield, Amber, PA-C  Active   traZODone (DESYREL) 50 MG tablet [id number] Yes TAKE 2 TABLETS BY MOUTH AT BEDTIME.  Patient taking differently: Take 50 mg by mouth at bedtime.   Levert Feinstein, MD Taking Active Self  vitamin B-12 (CYANOCOBALAMIN) 500 MCG tablet [id number] Yes Take 500 mcg by mouth in the morning. [provider] Taking Active Self           Med Note Wilkie Aye, Terry  Fri Jun 23, 2023  4:53 PM) Unknown            Home Care and Equipment/Supplies: Were Home Health Services Ordered?: Yes Name of Home Health Agency:: [facility] Has Agency set up a time to come to your home?: No (Daughter  vocices agency called her today and will call her back to confirm date and time of home visit, pt has also been set up with outpt therapy by surgeon office-reviewed appt per AVS with daughter) Any new equipment or medical supplies ordered?: NA  Functional Questionnaire: Do you need assistance with bathing/showering or dressing?: Yes (daughter assists with ADLs/IADLs) Do you need assistance with meal preparation?: Yes Do you need assistance with eating?: No Do you have difficulty maintaining continence: Yes Do you need assistance with getting out of bed/getting out of a chair/moving?: Yes Do you have difficulty managing or taking your medications?: Yes  Follow up appointments reviewed: PCP Follow-up appointment confirmed?: No (offered to assist with making PCP appt during call-Daughter states she will prrfers to call office on her own at a later time to make appt) Specialist Hospital Follow-up appointment confirmed?: Yes Date of Specialist follow-up appointment?: 07/21/23 Follow-Up Specialty Provider:: Dr. Elfredia Nevins surgeon Do you need transportation to your follow-up appointment?: No (daughter confirms she is able to get pt to appts) Do you understand care options if your condition(s) worsen?: Yes-patient verbalized understanding  SDOH Interventions Today    Flowsheet Row Most Recent Value  SDOH Interventions   Food Insecurity Interventions Intervention Not Indicated  Transportation Interventions Intervention Not Indicated      TOC interventions discussed/reviewed: -Post discharge activity limitations per provider -Post op wound/incision care -S/S of infection -Doctor visit discussed/reviewed -PCP -Doctor visits discussed/reviewed-Specialist -Provided Verbal Education: Nutrition, increasing fluid and fiber intake,medications & their functions, BM mgmt,Safety discussed/reviewed  Antionette Fairy, RN,BSN,CCM RN Care Manager Transitions of Care  [place]  Direct Phone: [phone] Toll Free: [phone] Fax: [phone]

## 2023-07-12 NOTE — Discharge Summary (Signed)
Patient ID: Kimberly [name] MRN: [id number] DOB/AGE: 09-20-1940 82 y.o.  Admit date: 07/06/2023 Discharge date: 07/10/2023  Admission Diagnoses:  Principal Problem:   S/P reverse total shoulder arthroplasty, right   Discharge Diagnoses:  Same  Past Medical History:  Diagnosis Date   Arthritis    Complication of anesthesia    Pt reports that she was told "someone had to breath for her" She is not clear what this means   Difficult intubation    Diverticulitis    GERD (gastroesophageal reflux disease)    History of COVID-19 05/2020   Hypercholesteremia    Hypertension    Mild cognitive impairment    Peripheral neuropathy    Pre-diabetes    Restless leg syndrome     Surgeries: Procedure(s): REVERSE SHOULDER ARTHROPLASTY on 07/06/2023   Consultants:   Discharged Condition: Improved  Hospital Course: Kimberly [name] is an 82 y.o. female who was admitted 07/06/2023 for operative treatment ofS/P reverse total shoulder arthroplasty, right. Patient has severe unremitting pain that affects sleep, daily activities, and work/hobbies. After pre-op clearance the patient was taken to the operating room on 07/06/2023 and underwent  Procedure(s): REVERSE SHOULDER ARTHROPLASTY.    Patient was given perioperative antibiotics:  Anti-infectives (From admission, onward)    Start     Dose/Rate Route Frequency Ordered Stop   07/06/23 1600  ceFAZolin (ANCEF) IVPB 2g/100 mL premix        2 g 200 mL/hr over 30 Minutes Intravenous Every 6 hours 07/06/23 1254 07/07/23 0413   07/06/23 0730  ceFAZolin (ANCEF) IVPB 2g/100 mL premix        2 g 200 mL/hr over 30 Minutes Intravenous On call to O.R. 07/06/23 0716 07/06/23 1022        Patient was given sequential compression devices, early ambulation, and chemoprophylaxis to prevent DVT.  Patient benefited maximally from hospital stay. She initially had some issues with recall regarding her restrictions for her shoulder but benefited from  continued therapy ans was deemed stable and appropriate for DC home.    Discharge Medications:   Allergies as of 07/10/2023       Reactions   Amoxicillin Swelling   Throat felt swollen   Ciprofloxacin Itching   Adhesive [tape] Itching   Redness   Benzoyl Peroxide Itching   Metronidazole Hives   Oxycodone Other (See Comments)   Felt disoriented   Betadine [povidone Iodine] Rash   Hydrocodone Itching, Anxiety   Sulfa Antibiotics Itching, Rash        Medication List     STOP taking these medications    docusate sodium 100 MG capsule Commonly known as: COLACE   HYDROcodone-acetaminophen 5-325 MG tablet Commonly known as: NORCO/VICODIN   meloxicam 15 MG tablet Commonly known as: MOBIC   ondansetron 4 MG tablet Commonly known as: ZOFRAN       TAKE these medications    acetaminophen 650 MG CR tablet Commonly known as: TYLENOL Take 650-1,300 mg by mouth See admin instructions. Take 650 mg in the AM, 650 mg at noon, and 1300 mg at bedtime.   amLODipine 2.5 MG tablet Commonly known as: NORVASC Take 2.5 mg by mouth every morning.   aspirin EC 81 MG tablet Take 1 tablet (81 mg total) by mouth daily. Swallow whole.   diphenhydrAMINE 25 mg capsule Commonly known as: BENADRYL Take 1-2 capsules (25-50 mg total) by mouth every 6 (six) hours as needed for itching (Dose with px medications).   DULoxetine 60 MG capsule Commonly known  as: CYMBALTA TAKE 1 CAPSULE BY MOUTH EVERY DAY   gabapentin 300 MG capsule Commonly known as: NEURONTIN Take 300 mg by mouth 4 (four) times daily.   omeprazole 40 MG capsule Commonly known as: PRILOSEC Take 40 mg by mouth in the morning.   pramipexole 0.25 MG tablet Commonly known as: MIRAPEX Take 0.25 mg by mouth at bedtime.   simvastatin 20 MG tablet Commonly known as: ZOCOR Take 1 tablet (20 mg total) by mouth at bedtime. What changed:  medication strength how much to take   SYSTANE OP Place 1 drop into both eyes every 6  (six) hours as needed (dry eyes).   traMADol 50 MG tablet Commonly known as: ULTRAM Take 1-2 tablets (50-100 mg total) by mouth every 6 (six) hours as needed for moderate pain (pain score 4-6). What changed: how much to take   traZODone 50 MG tablet Commonly known as: DESYREL TAKE 2 TABLETS BY MOUTH AT BEDTIME. What changed: how much to take   vitamin B-12 500 MCG tablet Commonly known as: CYANOCOBALAMIN Take 500 mcg by mouth in the morning.        Diagnostic Studies: No results found.  Disposition: Discharge disposition: 01-Home or Self Care       Discharge Instructions     Call MD / Call 911   Complete by: As directed    If you experience chest pain or shortness of breath, CALL 911 and be transported to the hospital emergency room.  If you develope a fever above 101 F, pus (white drainage) or increased drainage or redness at the wound, or calf pain, call your surgeon's office.   Constipation Prevention   Complete by: As directed    Drink plenty of fluids.  Prune juice may be helpful.  You may use a stool softener, such as Colace (over the counter) 100 mg twice a day.  Use MiraLax (over the counter) for constipation as needed.   Discharge instructions   Complete by: As directed    Discharge Instructions after Reverse Total Shoulder Arthroplasty   A sling has been provided for you. You are to wear this at all times (except for bathing and dressing), until your first post operative visit with Dr. Ave Filter. Please also wear while sleeping at night. While you bath and dress, let the arm/elbow extend straight down to stretch your elbow. Wiggle your fingers and pump your first while your in the sling to prevent hand swelling. Use ice on the shoulder intermittently over the first 48 hours after surgery. Continue to use ice or and ice machine as needed after 48 hours for pain control/swelling.  Pain medicine has been prescribed for you.  Use your medicine liberally over the  first 48 hours, and then you can begin to taper your use. You may take Extra Strength Tylenol or Tylenol only in place of the pain pills. DO NOT take ANY nonsteroidal anti-inflammatory pain medications: Advil, Motrin, Ibuprofen, Aleve, Naproxen or Naprosyn.  Take one aspirin a day for 2 weeks after surgery, unless you have an aspirin sensitivity/allergy or asthma.  Leave your dressing on until your first follow up visit.  You may shower with the dressing.  Hold your arm as if you still have your sling on while you shower. Simply allow the water to wash over the site and then pat dry. Make sure your axilla (armpit) is completely dry after showering.    Please call [phone] during normal business hours or [phone] after hours for any problems.  Including the following:  - excessive redness of the incisions - drainage for more than 4 days - fever of more than 101.5 F  *Please note that pain medications will not be refilled after hours or on weekends.  HOSPITAL PHARMACY RECOMMENDED THAT ZOCOR DOSAGE BE DECREASED DUE TO RISK OF LOVER TOXICITY WHEN TAKEN OVER 20MG  IN CONJUNCTION WITH AMLODIPINE. PLEASE DISCUSS THIS WITH YOUR PCP.   Increase activity slowly as tolerated   Complete by: As directed    Post-operative opioid taper instructions:   Complete by: As directed    POST-OPERATIVE OPIOID TAPER INSTRUCTIONS: It is important to wean off of your opioid medication as soon as possible. If you do not need pain medication after your surgery it is ok to stop day one. Opioids include: Codeine, Hydrocodone(Norco, Vicodin), Oxycodone(Percocet, oxycontin) and hydromorphone amongst others.  Long term and even short term use of opiods can cause: Increased pain response Dependence Constipation Depression Respiratory depression And more.  Withdrawal symptoms can include Flu like symptoms Nausea, vomiting And more Techniques to manage these symptoms Hydrate well Eat regular healthy  meals Stay active Use relaxation techniques(deep breathing, meditating, yoga) Do Not substitute Alcohol to help with tapering If you have been on opioids for less than two weeks and do not have pain than it is ok to stop all together.  Plan to wean off of opioids This plan should start within one week post op of your joint replacement. Maintain the same interval or time between taking each dose and first decrease the dose.  Cut the total daily intake of opioids by one tablet each day Next start to increase the time between doses. The last dose that should be eliminated is the evening dose.           Follow-up Information     Jones Broom, MD. Go on 07/21/2023.   Specialty: Orthopedic Surgery Why: Your appointment is scheduled for 10:00 Contact information: [address] [place] [address] [phone]         [facility], [place]. Go on 07/21/2023.   Why: Your outpatient physical therapy appointment is scheduled for 12:00 Contact information: Physical Therapy [address] [place] [address] [phone]         [facility] Follow up.   Specialty: Home Health Services Why: CSW has set up patient with [facility]. Contact information: [address] [place] [address] [phone]                  Signed: [place] Kat[place] L. Porterfield, PA-C 07/12/2023, 2:41 PM

## 2023-07-13 DIAGNOSIS — Z471 Aftercare following joint replacement surgery: Secondary | ICD-10-CM | POA: Diagnosis not present

## 2023-07-13 DIAGNOSIS — Z96611 Presence of right artificial shoulder joint: Secondary | ICD-10-CM | POA: Diagnosis not present

## 2023-07-13 DIAGNOSIS — E7800 Pure hypercholesterolemia, unspecified: Secondary | ICD-10-CM | POA: Diagnosis not present

## 2023-07-13 DIAGNOSIS — G8929 Other chronic pain: Secondary | ICD-10-CM | POA: Diagnosis not present

## 2023-07-13 DIAGNOSIS — G629 Polyneuropathy, unspecified: Secondary | ICD-10-CM | POA: Diagnosis not present

## 2023-07-13 DIAGNOSIS — E538 Deficiency of other specified B group vitamins: Secondary | ICD-10-CM | POA: Diagnosis not present

## 2023-07-13 DIAGNOSIS — Z981 Arthrodesis status: Secondary | ICD-10-CM | POA: Diagnosis not present

## 2023-07-13 DIAGNOSIS — K5730 Diverticulosis of large intestine without perforation or abscess without bleeding: Secondary | ICD-10-CM | POA: Diagnosis not present

## 2023-07-13 DIAGNOSIS — Z9181 History of falling: Secondary | ICD-10-CM | POA: Diagnosis not present

## 2023-07-13 DIAGNOSIS — R7303 Prediabetes: Secondary | ICD-10-CM | POA: Diagnosis not present

## 2023-07-13 DIAGNOSIS — Z8616 Personal history of COVID-19: Secondary | ICD-10-CM | POA: Diagnosis not present

## 2023-07-13 DIAGNOSIS — M5416 Radiculopathy, lumbar region: Secondary | ICD-10-CM | POA: Diagnosis not present

## 2023-07-13 DIAGNOSIS — G2581 Restless legs syndrome: Secondary | ICD-10-CM | POA: Diagnosis not present

## 2023-07-13 DIAGNOSIS — K219 Gastro-esophageal reflux disease without esophagitis: Secondary | ICD-10-CM | POA: Diagnosis not present

## 2023-07-13 DIAGNOSIS — G3184 Mild cognitive impairment, so stated: Secondary | ICD-10-CM | POA: Diagnosis not present

## 2023-07-13 DIAGNOSIS — Z96652 Presence of left artificial knee joint: Secondary | ICD-10-CM | POA: Diagnosis not present

## 2023-07-13 DIAGNOSIS — Z90710 Acquired absence of both cervix and uterus: Secondary | ICD-10-CM | POA: Diagnosis not present

## 2023-07-13 DIAGNOSIS — Z7982 Long term (current) use of aspirin: Secondary | ICD-10-CM | POA: Diagnosis not present

## 2023-07-13 DIAGNOSIS — I10 Essential (primary) hypertension: Secondary | ICD-10-CM | POA: Diagnosis not present

## 2023-07-17 DIAGNOSIS — G8929 Other chronic pain: Secondary | ICD-10-CM | POA: Diagnosis not present

## 2023-07-17 DIAGNOSIS — I10 Essential (primary) hypertension: Secondary | ICD-10-CM | POA: Diagnosis not present

## 2023-07-17 DIAGNOSIS — Z471 Aftercare following joint replacement surgery: Secondary | ICD-10-CM | POA: Diagnosis not present

## 2023-07-17 DIAGNOSIS — G629 Polyneuropathy, unspecified: Secondary | ICD-10-CM | POA: Diagnosis not present

## 2023-07-17 DIAGNOSIS — M5416 Radiculopathy, lumbar region: Secondary | ICD-10-CM | POA: Diagnosis not present

## 2023-07-17 DIAGNOSIS — G2581 Restless legs syndrome: Secondary | ICD-10-CM | POA: Diagnosis not present

## 2023-07-18 DIAGNOSIS — G2581 Restless legs syndrome: Secondary | ICD-10-CM | POA: Diagnosis not present

## 2023-07-18 DIAGNOSIS — G8929 Other chronic pain: Secondary | ICD-10-CM | POA: Diagnosis not present

## 2023-07-18 DIAGNOSIS — Z471 Aftercare following joint replacement surgery: Secondary | ICD-10-CM | POA: Diagnosis not present

## 2023-07-18 DIAGNOSIS — G629 Polyneuropathy, unspecified: Secondary | ICD-10-CM | POA: Diagnosis not present

## 2023-07-18 DIAGNOSIS — M5416 Radiculopathy, lumbar region: Secondary | ICD-10-CM | POA: Diagnosis not present

## 2023-07-18 DIAGNOSIS — I10 Essential (primary) hypertension: Secondary | ICD-10-CM | POA: Diagnosis not present

## 2023-07-19 DIAGNOSIS — G2581 Restless legs syndrome: Secondary | ICD-10-CM | POA: Diagnosis not present

## 2023-07-19 DIAGNOSIS — M5416 Radiculopathy, lumbar region: Secondary | ICD-10-CM | POA: Diagnosis not present

## 2023-07-19 DIAGNOSIS — G629 Polyneuropathy, unspecified: Secondary | ICD-10-CM | POA: Diagnosis not present

## 2023-07-19 DIAGNOSIS — G8929 Other chronic pain: Secondary | ICD-10-CM | POA: Diagnosis not present

## 2023-07-19 DIAGNOSIS — Z471 Aftercare following joint replacement surgery: Secondary | ICD-10-CM | POA: Diagnosis not present

## 2023-07-19 DIAGNOSIS — I10 Essential (primary) hypertension: Secondary | ICD-10-CM | POA: Diagnosis not present

## 2023-07-20 DIAGNOSIS — G629 Polyneuropathy, unspecified: Secondary | ICD-10-CM | POA: Diagnosis not present

## 2023-07-20 DIAGNOSIS — Z471 Aftercare following joint replacement surgery: Secondary | ICD-10-CM | POA: Diagnosis not present

## 2023-07-20 DIAGNOSIS — M5416 Radiculopathy, lumbar region: Secondary | ICD-10-CM | POA: Diagnosis not present

## 2023-07-20 DIAGNOSIS — G2581 Restless legs syndrome: Secondary | ICD-10-CM | POA: Diagnosis not present

## 2023-07-20 DIAGNOSIS — I10 Essential (primary) hypertension: Secondary | ICD-10-CM | POA: Diagnosis not present

## 2023-07-20 DIAGNOSIS — G8929 Other chronic pain: Secondary | ICD-10-CM | POA: Diagnosis not present

## 2023-07-21 DIAGNOSIS — M25611 Stiffness of right shoulder, not elsewhere classified: Secondary | ICD-10-CM | POA: Diagnosis not present

## 2023-07-21 DIAGNOSIS — M19011 Primary osteoarthritis, right shoulder: Secondary | ICD-10-CM | POA: Diagnosis not present

## 2023-07-21 DIAGNOSIS — Z96611 Presence of right artificial shoulder joint: Secondary | ICD-10-CM | POA: Diagnosis not present

## 2023-08-01 DIAGNOSIS — M25611 Stiffness of right shoulder, not elsewhere classified: Secondary | ICD-10-CM | POA: Diagnosis not present

## 2023-08-01 DIAGNOSIS — Z96611 Presence of right artificial shoulder joint: Secondary | ICD-10-CM | POA: Diagnosis not present

## 2023-08-08 DIAGNOSIS — M25611 Stiffness of right shoulder, not elsewhere classified: Secondary | ICD-10-CM | POA: Diagnosis not present

## 2023-08-08 DIAGNOSIS — Z96611 Presence of right artificial shoulder joint: Secondary | ICD-10-CM | POA: Diagnosis not present

## 2023-08-10 DIAGNOSIS — Z96611 Presence of right artificial shoulder joint: Secondary | ICD-10-CM | POA: Diagnosis not present

## 2023-08-10 DIAGNOSIS — M25611 Stiffness of right shoulder, not elsewhere classified: Secondary | ICD-10-CM | POA: Diagnosis not present

## 2023-08-11 ENCOUNTER — Ambulatory Visit: Payer: Medicare Other

## 2023-08-11 VITALS — Ht 63.0 in | Wt 160.0 lb

## 2023-08-11 DIAGNOSIS — Z0000 Encounter for general adult medical examination without abnormal findings: Secondary | ICD-10-CM

## 2023-08-14 DIAGNOSIS — M25611 Stiffness of right shoulder, not elsewhere classified: Secondary | ICD-10-CM | POA: Diagnosis not present

## 2023-08-14 DIAGNOSIS — Z96611 Presence of right artificial shoulder joint: Secondary | ICD-10-CM | POA: Diagnosis not present

## 2023-08-14 NOTE — Progress Notes (Signed)
Subjective:   Kimberly [name] is a 82 y.o. female who presents for Medicare Annual (Subsequent) preventive examination.  Visit Complete: Virtual I connected with  Adilen Winesett Nanney on 08/14/23 by a audio enabled telemedicine application and verified that I am speaking with the correct person using two identifiers.  Patient Location: Home  Provider Location: Home Office  I discussed the limitations of evaluation and management by telemedicine. The patient expressed understanding and agreed to proceed.  Vital Signs: Because this visit was a virtual/telehealth visit, some criteria may be missing or patient reported. Any vitals not documented were not able to be obtained and vitals that have been documented are patient reported.   Cardiac Risk Factors include: advanced age (>25men, >75 women);hypertension     Objective:    Today's Vitals   08/14/23 1349  Weight: 160 lb (72.6 kg)  Height: 5\' 3"  (1.6 m)   Body mass index is 28.34 kg/m.     08/14/2023    2:02 PM 07/06/2023   12:45 PM 06/27/2023    1:49 PM 01/12/2023    8:32 AM 01/05/2023    2:43 PM 04/07/2022   10:00 AM 03/29/2022   10:26 AM  Advanced Directives  Does Patient Have a Medical Advance Directive? Yes Yes Yes Yes Yes Yes Yes  Type of Estate agent of [place];Living will Healthcare Power of [place];Living will Healthcare Power of [place];Living will Healthcare Power of [place];Living will Healthcare Power of [place];Living will Healthcare Power of [place];Living will Healthcare Power of [place];Living will  Does patient want to make changes to medical advance directive?  No - Patient declined  No - Patient declined  No - Patient declined   Copy of Healthcare Power of Attorney in Chart? No - copy requested   No - copy requested No - copy requested No - copy requested No - copy requested    Current Medications (verified) Outpatient Encounter Medications as of 08/11/2023  Medication Sig    acetaminophen (TYLENOL) 650 MG CR tablet Take 650-1,300 mg by mouth See admin instructions. Take 650 mg in the AM, 650 mg at noon, and 1300 mg at bedtime.   amLODipine (NORVASC) 2.5 MG tablet Take 2.5 mg by mouth every morning.   aspirin EC 81 MG tablet Take 1 tablet (81 mg total) by mouth daily. Swallow whole.   diphenhydrAMINE (BENADRYL) 25 mg capsule Take 1-2 capsules (25-50 mg total) by mouth every 6 (six) hours as needed for itching (Dose with px medications).   DULoxetine (CYMBALTA) 60 MG capsule TAKE 1 CAPSULE BY MOUTH EVERY DAY   gabapentin (NEURONTIN) 300 MG capsule Take 300 mg by mouth 4 (four) times daily.   omeprazole (PRILOSEC) 40 MG capsule Take 40 mg by mouth in the morning.   Polyethyl Glycol-Propyl Glycol (SYSTANE OP) Place 1 drop into both eyes every 6 (six) hours as needed (dry eyes).   pramipexole (MIRAPEX) 0.25 MG tablet Take 0.25 mg by mouth at bedtime.   simvastatin (ZOCOR) 20 MG tablet Take 1 tablet (20 mg total) by mouth at bedtime.   traMADol (ULTRAM) 50 MG tablet Take 1-2 tablets (50-100 mg total) by mouth every 6 (six) hours as needed for moderate pain (pain score 4-6).   traZODone (DESYREL) 50 MG tablet TAKE 2 TABLETS BY MOUTH AT BEDTIME. (Patient taking differently: Take 50 mg by mouth at bedtime.)   vitamin B-12 (CYANOCOBALAMIN) 500 MCG tablet Take 500 mcg by mouth in the morning.   No facility-administered encounter medications on file as  of 08/11/2023.    Allergies (verified) Amoxicillin, Ciprofloxacin, Adhesive [tape], Benzoyl peroxide, Metronidazole, Oxycodone, Betadine [povidone iodine], Hydrocodone, and Sulfa antibiotics   History: Past Medical History:  Diagnosis Date   Arthritis    Complication of anesthesia    Pt reports that she was told "someone had to breath for her" She is not clear what this means   Difficult intubation    Diverticulitis    GERD (gastroesophageal reflux disease)    History of COVID-19 05/2020   Hypercholesteremia     Hypertension    Mild cognitive impairment    Peripheral neuropathy    Pre-diabetes    Restless leg syndrome    Past Surgical History:  Procedure Laterality Date   ABDOMINAL HYSTERECTOMY     Age 57   BACK SURGERY  2004   blephroplasty     bilateral   BREAST BIOPSY Left    COLONOSCOPY WITH PROPOFOL N/A 11/13/2020   Procedure: COLONOSCOPY WITH PROPOFOL;  Surgeon: Jeani Hawking, MD;  Location: [facility];  Service: Endoscopy;  Laterality: N/A;   ESOPHAGOGASTRODUODENOSCOPY (EGD) WITH PROPOFOL N/A 11/13/2020   Procedure: ESOPHAGOGASTRODUODENOSCOPY (EGD) WITH PROPOFOL;  Surgeon: Jeani Hawking, MD;  Location: [facility];  Service: Endoscopy;  Laterality: N/A;   EYE SURGERY     bilateral cateract    POLYPECTOMY  11/13/2020   Procedure: POLYPECTOMY;  Surgeon: Jeani Hawking, MD;  Location: [facility];  Service: Endoscopy;;   REVERSE SHOULDER ARTHROPLASTY Left 04/07/2022   Procedure: REVERSE SHOULDER ARTHROPLASTY;  Surgeon: Jones Broom, MD;  Location: [facility];  Service: Orthopedics;  Laterality: Left;   REVERSE SHOULDER ARTHROPLASTY Right 07/06/2023   Procedure: REVERSE SHOULDER ARTHROPLASTY;  Surgeon: Jones Broom, MD;  Location: [facility];  Service: Orthopedics;  Laterality: Right;   TONSILLECTOMY     As a child   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   Procedure: TOTAL KNEE ARTHROPLASTY;  Surgeon: Durene Romans, MD;  Location: [facility];  Service: Orthopedics;  Laterality: Left;  70 mins   TRANSFORAMINAL LUMBAR INTERBODY FUSION (TLIF) WITH PEDICLE SCREW FIXATION 1 LEVEL Right 01/12/2023   Procedure: RIGHT-SIDED TRSANSFORAMINAL LUMBAR INTERBODY FUSION LUMBAR 4 - LUMBAR 5  WITH INSTRUMENTATION AND ALLOGRAFT;  Surgeon: Estill Bamberg, MD;  Location: [facility];  Service: Orthopedics;  Laterality: Right;   Family History  Problem Relation Age of Onset   Breast cancer Sister    Other Mother        passed away at age 33   Heart attack Father        passed away at age 43   Social  History   Socioeconomic History   Marital status: Widowed    Spouse name: Not on file   Number of children: 1   Years of education: 12   Highest education level: High school graduate  Occupational History   Occupation: Retired  Tobacco Use   Smoking status: Never   Smokeless tobacco: Never  Vaping Use   Vaping status: Never Used  Substance and Sexual Activity   Alcohol use: No   Drug use: No   Sexual activity: Not Currently  Other Topics Concern   Not on file  Social History Narrative   Lives at home alone.   Right-handed.   1 cup caffeine daily.   Social Drivers of Corporate investment banker Strain: Low Risk  (08/14/2023)   Overall Financial Resource Strain (CARDIA)    Difficulty of Paying Living Expenses: Not hard at all  Food Insecurity: No Food Insecurity (08/14/2023)  Hunger Vital Sign    Worried About Running Out of Food in the Last Year: Never true    Ran Out of Food in the Last Year: Never true  Transportation Needs: No Transportation Needs (08/14/2023)   PRAPARE - Administrator, Civil Service (Medical): No    Lack of Transportation (Non-Medical): No  Physical Activity: Inactive (08/14/2023)   Exercise Vital Sign    Days of Exercise per Week: 0 days    Minutes of Exercise per Session: 0 min  Stress: No Stress Concern Present (08/14/2023)   [employer] of Occupational Health - Occupational Stress Questionnaire    Feeling of Stress : Not at all  Social Connections: Moderately Integrated (08/14/2023)   Social Connection and Isolation Panel [NHANES]    Frequency of Communication with Friends and Family: More than three times a week    Frequency of Social Gatherings with Friends and Family: More than three times a week    Attends Religious Services: More than 4 times per year    Active Member of [employer] or Organizations: Yes    Attends Banker Meetings: More than 4 times per year    Marital Status: Widowed    Tobacco  Counseling Counseling given: Not Answered   Clinical Intake:  Pre-visit preparation completed: Yes  Pain : No/denies pain     BMI - recorded: 28.34 Nutritional Status: BMI 25 -29 Overweight Nutritional Risks: None Diabetes: No  How often do you need to have someone help you when you read instructions, pamphlets, or other written materials from your doctor or pharmacy?: 1 - Never  Interpreter Needed?: No  Information entered by :: Theresa Mulligan LPN   Activities of Daily Living    08/14/2023    1:59 PM 07/06/2023   12:45 PM  In your present state of health, do you have any difficulty performing the following activities:  Hearing? 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 0 0  Difficulty concentrating or making decisions? 0 0  Walking or climbing stairs? 0   Dressing or bathing? 0   Doing errands, shopping? 0 0  Preparing Food and eating ? N   Using the Toilet? N   In the past six months, have you accidently leaked urine? N   Do you have problems with loss of bowel control? N   Managing your Medications? N   Managing your Finances? N   Housekeeping or managing your Housekeeping? N     Patient Care Team: Philip Aspen, Limmie Patricia, MD as PCP - General (Internal Medicine)  Indicate any recent Medical Services you may have received from other than [facility] providers in the past year (date may be approximate).     Assessment:   This is a routine wellness examination for Melek.  Hearing/Vision screen Hearing Screening - Comments::  Hearing difficulties  without use of Hearing Aids Vision Screening - Comments:: Wears rx glasses - up to date with routine eye exams with  [facility]   Goals Addressed               This Visit's Progress     Heal from Shoulder Surgery (pt-stated)         Depression Screen    08/14/2023    1:58 PM 03/16/2023    4:42 PM 02/14/2023   11:38 AM 12/28/2022    4:43 PM  PHQ 2/9 Scores  PHQ - 2 Score 0 3  2  PHQ- 9 Score  7  3  Exception Documentation   Patient refusal     Fall Risk    08/14/2023    2:01 PM 03/16/2023    4:41 PM 02/14/2023   11:38 AM 12/28/2022    4:42 PM  Fall Risk   Falls in the past year? 0 0 0 0  Number falls in past yr: 0 0 0 0  Injury with Fall? 0 0 0 0  Risk for fall due to : No Fall Risks     Follow up Falls prevention discussed Falls evaluation completed Falls evaluation completed Falls evaluation completed    MEDICARE RISK AT HOME:    TIMED UP AND GO:  Was the test performed?  No    Cognitive Function:      11/24/2021    8:00 AM  Montreal Cognitive Assessment   Visuospatial/ Executive (0/5) 2  Naming (0/3) 3  Attention: Read list of digits (0/2) 2  Attention: Read list of letters (0/1) 1  Attention: Serial 7 subtraction starting at 100 (0/3) 2  Language: Repeat phrase (0/2) 2  Language : Fluency (0/1) 0  Abstraction (0/2) 2  Delayed Recall (0/5) 2  Orientation (0/6) 6  Total 22      08/14/2023    2:02 PM  6CIT Screen  What Year? --  What month? --  What time? --  Count back from 20 --  Months in reverse --  Repeat phrase --    Immunizations  There is no immunization history on file for this patient.  TDAP status: Due, Education has been provided regarding the importance of this vaccine. Advised may receive this vaccine at local pharmacy or Health Dept. Aware to provide a copy of the vaccination record if obtained from local pharmacy or Health Dept. Verbalized acceptance and understanding.  Flu Vaccine status: Due, Education has been provided regarding the importance of this vaccine. Advised may receive this vaccine at local pharmacy or Health Dept. Aware to provide a copy of the vaccination record if obtained from local pharmacy or Health Dept. Verbalized acceptance and understanding.  Pneumococcal vaccine status: Due, Education has been provided regarding the importance of this vaccine. Advised may receive this vaccine at local  pharmacy or Health Dept. Aware to provide a copy of the vaccination record if obtained from local pharmacy or Health Dept. Verbalized acceptance and understanding.  Covid-19 vaccine status: Declined, Education has been provided regarding the importance of this vaccine but patient still declined. Advised may receive this vaccine at local pharmacy or Health Dept.or vaccine clinic. Aware to provide a copy of the vaccination record if obtained from local pharmacy or Health Dept. Verbalized acceptance and understanding.  Qualifies for Shingles Vaccine? Yes   Zostavax completed No   Shingrix Completed?: No.    Education has been provided regarding the importance of this vaccine. Patient has been advised to call insurance company to determine out of pocket expense if they have not yet received this vaccine. Advised may also receive vaccine at local pharmacy or Health Dept. Verbalized acceptance and understanding.  Screening Tests Health Maintenance  Topic Date Due   DTaP/Tdap/Td (1 - Tdap) Never done   Zoster Vaccines- Shingrix (1 of 2) Never done   Pneumonia Vaccine 55+ Years old (1 of 1 - PCV) Never done   DEXA SCAN  Never done   INFLUENZA VACCINE  Never done   COVID-19 Vaccine (1 - 2024-25 season) Never done   Medicare Annual Wellness (AWV)  08/10/2024  HPV VACCINES  Aged Out    Health Maintenance  Health Maintenance Due  Topic Date Due   DTaP/Tdap/Td (1 - Tdap) Never done   Zoster Vaccines- Shingrix (1 of 2) Never done   Pneumonia Vaccine 27+ Years old (1 of 1 - PCV) Never done   DEXA SCAN  Never done   INFLUENZA VACCINE  Never done   COVID-19 Vaccine (1 - 2024-25 season) Never done        Bone Density status: Ordered Deferred. Pt provided with contact info and advised to call to schedule appt.     Additional Screening:    Vision Screening: Recommended annual ophthalmology exams for early detection of glaucoma and other disorders of the eye. Is the patient up to date with  their annual eye exam?  Yes  Who is the provider or what is the name of the office in which the patient attends annual eye exams? [facility] If pt is not established with a provider, would they like to be referred to a provider to establish care? No .   Dental Screening: Recommended annual dental exams for proper oral hygiene    Community Resource Referral / Chronic Care Management:  CRR required this visit?  No   CCM required this visit?  No     Plan:     I have personally reviewed and noted the following in the patient's chart:   Medical and social history Use of alcohol, tobacco or illicit drugs  Current medications and supplements including opioid prescriptions. Patient is currently taking opioid prescriptions. Information provided to patient regarding non-opioid alternatives. Patient advised to discuss non-opioid treatment plan with their provider. Functional ability and status Nutritional status Physical activity Advanced directives List of other physicians Hospitalizations, surgeries, and ER visits in previous 12 months Vitals Screenings to include cognitive, depression, and falls Referrals and appointments  In addition, I have reviewed and discussed with patient certain preventive protocols, quality metrics, and best practice recommendations. A written personalized care plan for preventive services as well as general preventive health recommendations were provided to patient.     Tillie Rung, LPN   [id number]/05/[id number]   After Visit Summary: (MyChart) Due to this being a telephonic visit, the after visit summary with patients personalized plan was offered to patient via MyChart   Nurse Notes: None

## 2023-08-14 NOTE — Patient Instructions (Addendum)
Kimberly [name] , Thank you for taking time to come for your Medicare Wellness Visit. I appreciate your ongoing commitment to your health goals. Please review the following plan we discussed and let me know if I can assist you in the future.   Referrals/Orders/Follow-Ups/Clinician Recommendations:   This is a list of the screening recommended for you and due dates:  Health Maintenance  Topic Date Due   DTaP/Tdap/Td vaccine (1 - Tdap) Never done   Zoster (Shingles) Vaccine (1 of 2) Never done   Pneumonia Vaccine (1 of 1 - PCV) Never done   DEXA scan (bone density measurement)  Never done   Flu Shot  Never done   COVID-19 Vaccine (1 - 2024-25 season) Never done   Medicare Annual Wellness Visit  08/10/2024   HPV Vaccine  Aged Out   Opioid Pain Medicine Management Opioid pain medicines are strong medicines that are used to treat bad or very bad pain. When you take them for a short time, they can help you: Sleep better. Do better in physical therapy. Feel better during the first few days after you get hurt. Recover from surgery. Only take these medicines if a doctor says that you can. You should only take them for a short time. This is because opioids can be very addictive. This means that they are hard to stop taking. The longer you take opioids, the harder it may be to stop taking them. What are the risks? Opioids can cause problems (side effects). Taking them for more than 3 days raises your chance of problems, such as: Trouble pooping (constipation). Feeling sick to your stomach (nausea). Vomiting. Feeling very sleepy. Confusion. Not being able to stop taking the medicine. Breathing problems. Taking opioids for a long time can make it hard for you to do daily tasks. It can also put you at risk for: Car accidents. Depression. Suicide. Heart attack. Taking too much of the medicine (overdose). This can lead to death. What is a pain treatment plan? A pain treatment plan is a plan made by  you and your doctor. Work with your doctor to make a plan for treating your pain. To help you do this: Talk about the goals of your treatment, including: How much pain you might expect to have. How you will manage the pain. Talk about the risks and benefits of taking these medicines for your condition. Remember that a good treatment plan uses more than one approach and lowers the risks of side effects. Tell your doctor about the amount of medicines you take and about any drug or alcohol use. Get your pain medicine prescriptions from only one doctor. Pain can be managed with other treatments. Work with your doctor to find other ways to help your pain, such as: Physical therapy or doing gentle exercises. Counseling. Eating healthy foods. Massage. Meditation. Other pain medicines. How to use opioid pain medicine safely Taking medicine Take your pain medicine exactly as told by your doctor. Take it only when you need it. If your pain is not too bad, you may take less medicine if your doctor allows. If you have no pain, do not take the medicine unless your doctor tells you to take it. If your pain is very bad, do not take more medicine than your doctor told you to take. Call your doctor to know what to do. Write down the times when you take your pain medicine. Look at the times before you take your next dose. Take other over-the-counter or prescription  medicines only as told by your doctor. Keeping yourself and others safe  While you are taking opioids: Do not drive, use machines, or power tools. Do not sign important papers (legal documents). Do not drink alcohol. Do not take sleeping pills. Do not take care of children by yourself. Do not do activities where you need to climb or be in high places, like working on a ladder. Do not go to a lake, river, ocean, swimming pool, or hot tub. Keep your opioids locked up or in a place where children cannot reach them. Do not share your pain  medicine with anyone. Stopping your use of opioids If you have been taking opioids for more than a few weeks, you may need to slowly decrease (taper) how much you take until you stop taking them. Doing this can lower your chance of having symptoms.  Symptoms that come from suddenly stopping the use of opioids include: Pain and cramping in your belly (abdomen). Feeling sick to your stomach (nausea).z Sweating. Feeling very sleepy. Feeling restless. Shaking you cannot control (tremors). Cravings for the medicine. Do not try to stop taking them by yourself. Work with your doctor to stop. Your doctor will help you take less until you are not taking the medicine at all. Getting rid of unused pills Do not save any pills that you did not use. Get rid of the pills by: Taking them to a take-back program in your area. Bringing them to a pharmacy that receives unused pills. Flushing them down the toilet. Check the label or package insert of your medicine to see whether this is safe to do. Throwing them in the trash. Check the label or package insert of your medicine to see whether this is safe to do. If it is safe to throw them out: Take the pills out of their container. Put the pills into a container you can seal. Mix the pills with used coffee grounds, food scraps, dirt, or cat litter. Put this in the trash. Follow these instructions at home: Activity Do exercises as told by your doctor. Avoid doing things that make your pain worse. Return to your normal activities as told by your doctor. Ask your doctor what activities are safe for you. General instructions You may need to take these actions to prevent or treat constipation: Drink enough fluid to keep your pee (urine) pale yellow. Take over-the-counter or prescription medicines. Eat foods that are high in fiber. These include beans, whole grains, and fresh fruits and vegetables. Limit foods that are high in fat and sugar. These include fried  or sweet foods. Keep all follow-up visits. Where to find support If you have been taking opioids for a long time, get help from a local support group or counselor. Ask your doctor about this. Where to find more information Centers for Disease Control and Prevention (CDC): FootballExhibition.com.br [place] Food and Drug Administration (FDA): PumpkinSearch.com.ee Get help right away if: You may have taken too much of an opioid (overdosed). Common symptoms of an overdose: Your breathing is slower or more shallow than normal. You have a very slow heartbeat. Your speech is not normal. You vomit or you feel as if you may vomit. The black centers of your eyes (pupils) are smaller than normal. You have other potential symptoms: You feel very confused. You faint. You are very sleepy. You have cold skin. You have blue lips or fingernails. You have thoughts of harming yourself or harming others. These symptoms may be an emergency. Get help  right away. Call your local emergency services (911 in the [place] Do not wait to see if the symptoms will go away. Do not drive yourself to the hospital. Get help right away if you feel like you may hurt yourself or others, or have thoughts about taking your own life. Go to your nearest emergency room or: Call your local emergency services (911 in the [place] Call the [facility] at [phone]. Call a suicide crisis helpline, such as the National Suicide Prevention Lifeline at 8032327991 or 988 in the [place] This is open 24 hours a day. Text the Crisis Text Line at 3023324392. Summary Opioid are strong medicines that are used to treat bad or very bad pain. A pain treatment plan is a plan made by you and your doctor. Work with your doctor to make a plan for treating your pain. If you think that you or someone else may have taken too much of an opioid, get help right away. This information is not intended to replace advice given to you by your health care provider.  Make sure you discuss any questions you have with your health care provider. Document Revised: 03/10/2021 Document Reviewed: 11/25/2020 Elsevier Patient Education  2024 [employer] Inc.  Advanced directives: (Copy Requested) Please bring a copy of your health care power of attorney and living will to the office to be added to your chart at your convenience.  Next Medicare Annual Wellness Visit scheduled for next year: Yes

## 2023-08-18 DIAGNOSIS — M25611 Stiffness of right shoulder, not elsewhere classified: Secondary | ICD-10-CM | POA: Diagnosis not present

## 2023-08-18 DIAGNOSIS — Z96611 Presence of right artificial shoulder joint: Secondary | ICD-10-CM | POA: Diagnosis not present

## 2023-08-19 ENCOUNTER — Other Ambulatory Visit: Payer: Self-pay | Admitting: Neurology

## 2023-08-25 DIAGNOSIS — Z96611 Presence of right artificial shoulder joint: Secondary | ICD-10-CM | POA: Diagnosis not present

## 2023-08-25 DIAGNOSIS — M25611 Stiffness of right shoulder, not elsewhere classified: Secondary | ICD-10-CM | POA: Diagnosis not present

## 2023-08-28 DIAGNOSIS — M25611 Stiffness of right shoulder, not elsewhere classified: Secondary | ICD-10-CM | POA: Diagnosis not present

## 2023-08-28 DIAGNOSIS — Z96611 Presence of right artificial shoulder joint: Secondary | ICD-10-CM | POA: Diagnosis not present

## 2023-08-31 DIAGNOSIS — Z96611 Presence of right artificial shoulder joint: Secondary | ICD-10-CM | POA: Diagnosis not present

## 2023-08-31 DIAGNOSIS — M25611 Stiffness of right shoulder, not elsewhere classified: Secondary | ICD-10-CM | POA: Diagnosis not present

## 2023-09-06 DIAGNOSIS — Z96611 Presence of right artificial shoulder joint: Secondary | ICD-10-CM | POA: Diagnosis not present

## 2023-09-06 DIAGNOSIS — M25611 Stiffness of right shoulder, not elsewhere classified: Secondary | ICD-10-CM | POA: Diagnosis not present

## 2023-09-07 DIAGNOSIS — M25611 Stiffness of right shoulder, not elsewhere classified: Secondary | ICD-10-CM | POA: Diagnosis not present

## 2023-09-07 DIAGNOSIS — Z96611 Presence of right artificial shoulder joint: Secondary | ICD-10-CM | POA: Diagnosis not present

## 2023-09-11 DIAGNOSIS — M25611 Stiffness of right shoulder, not elsewhere classified: Secondary | ICD-10-CM | POA: Diagnosis not present

## 2023-09-11 DIAGNOSIS — Z96611 Presence of right artificial shoulder joint: Secondary | ICD-10-CM | POA: Diagnosis not present

## 2023-09-14 DIAGNOSIS — Z96611 Presence of right artificial shoulder joint: Secondary | ICD-10-CM | POA: Diagnosis not present

## 2023-09-14 DIAGNOSIS — M25611 Stiffness of right shoulder, not elsewhere classified: Secondary | ICD-10-CM | POA: Diagnosis not present

## 2023-09-18 DIAGNOSIS — Z96611 Presence of right artificial shoulder joint: Secondary | ICD-10-CM | POA: Diagnosis not present

## 2023-09-18 DIAGNOSIS — M25611 Stiffness of right shoulder, not elsewhere classified: Secondary | ICD-10-CM | POA: Diagnosis not present

## 2023-09-22 DIAGNOSIS — Z96611 Presence of right artificial shoulder joint: Secondary | ICD-10-CM | POA: Diagnosis not present

## 2023-09-22 DIAGNOSIS — M25611 Stiffness of right shoulder, not elsewhere classified: Secondary | ICD-10-CM | POA: Diagnosis not present

## 2023-09-25 DIAGNOSIS — Z96611 Presence of right artificial shoulder joint: Secondary | ICD-10-CM | POA: Diagnosis not present

## 2023-09-25 DIAGNOSIS — M25611 Stiffness of right shoulder, not elsewhere classified: Secondary | ICD-10-CM | POA: Diagnosis not present

## 2023-10-02 DIAGNOSIS — Z96611 Presence of right artificial shoulder joint: Secondary | ICD-10-CM | POA: Diagnosis not present

## 2023-10-02 DIAGNOSIS — M25611 Stiffness of right shoulder, not elsewhere classified: Secondary | ICD-10-CM | POA: Diagnosis not present

## 2023-10-09 DIAGNOSIS — Z96611 Presence of right artificial shoulder joint: Secondary | ICD-10-CM | POA: Diagnosis not present

## 2023-10-09 DIAGNOSIS — M25611 Stiffness of right shoulder, not elsewhere classified: Secondary | ICD-10-CM | POA: Diagnosis not present

## 2023-10-11 DIAGNOSIS — M25611 Stiffness of right shoulder, not elsewhere classified: Secondary | ICD-10-CM | POA: Diagnosis not present

## 2023-10-11 DIAGNOSIS — Z96611 Presence of right artificial shoulder joint: Secondary | ICD-10-CM | POA: Diagnosis not present

## 2023-10-23 DIAGNOSIS — M25611 Stiffness of right shoulder, not elsewhere classified: Secondary | ICD-10-CM | POA: Diagnosis not present

## 2023-10-23 DIAGNOSIS — Z96611 Presence of right artificial shoulder joint: Secondary | ICD-10-CM | POA: Diagnosis not present

## 2023-11-08 ENCOUNTER — Other Ambulatory Visit: Payer: Self-pay | Admitting: Neurology

## 2023-11-10 DIAGNOSIS — H02834 Dermatochalasis of left upper eyelid: Secondary | ICD-10-CM | POA: Diagnosis not present

## 2023-11-10 DIAGNOSIS — H04123 Dry eye syndrome of bilateral lacrimal glands: Secondary | ICD-10-CM | POA: Diagnosis not present

## 2023-11-10 DIAGNOSIS — H40053 Ocular hypertension, bilateral: Secondary | ICD-10-CM | POA: Diagnosis not present

## 2023-11-10 DIAGNOSIS — H26492 Other secondary cataract, left eye: Secondary | ICD-10-CM | POA: Diagnosis not present

## 2023-11-10 DIAGNOSIS — H02831 Dermatochalasis of right upper eyelid: Secondary | ICD-10-CM | POA: Diagnosis not present

## 2023-11-10 DIAGNOSIS — H35033 Hypertensive retinopathy, bilateral: Secondary | ICD-10-CM | POA: Diagnosis not present

## 2023-11-10 DIAGNOSIS — H524 Presbyopia: Secondary | ICD-10-CM | POA: Diagnosis not present

## 2023-11-15 ENCOUNTER — Encounter: Payer: Self-pay | Admitting: Internal Medicine

## 2023-11-15 ENCOUNTER — Ambulatory Visit: Admitting: Internal Medicine

## 2023-11-15 VITALS — BP 130/80 | HR 95 | Temp 98.2°F | Wt 159.6 lb

## 2023-11-15 DIAGNOSIS — E782 Mixed hyperlipidemia: Secondary | ICD-10-CM | POA: Diagnosis not present

## 2023-11-15 DIAGNOSIS — K219 Gastro-esophageal reflux disease without esophagitis: Secondary | ICD-10-CM | POA: Diagnosis not present

## 2023-11-15 DIAGNOSIS — I10 Essential (primary) hypertension: Secondary | ICD-10-CM

## 2023-11-15 DIAGNOSIS — G2581 Restless legs syndrome: Secondary | ICD-10-CM | POA: Diagnosis not present

## 2023-11-15 DIAGNOSIS — H35031 Hypertensive retinopathy, right eye: Secondary | ICD-10-CM | POA: Diagnosis not present

## 2023-11-15 MED ORDER — PRAMIPEXOLE DIHYDROCHLORIDE 0.25 MG PO TABS: 0.2500 mg | ORAL_TABLET | Freq: Every day | ORAL | 1 refills | Status: DC

## 2023-11-15 MED ORDER — DULOXETINE HCL 60 MG PO CPEP
60.0000 mg | ORAL_CAPSULE | Freq: Every day | ORAL | 3 refills | Status: AC
Start: 2023-11-15 — End: ?

## 2023-11-15 MED ORDER — MELOXICAM 15 MG PO TABS: 15.0000 mg | ORAL_TABLET | Freq: Every day | ORAL | 3 refills | Status: DC

## 2023-11-15 MED ORDER — OMEPRAZOLE 40 MG PO CPDR: 40.0000 mg | DELAYED_RELEASE_CAPSULE | Freq: Every morning | ORAL | 1 refills | Status: DC

## 2023-11-15 MED ORDER — GABAPENTIN 300 MG PO CAPS: 300.0000 mg | ORAL_CAPSULE | Freq: Four times a day (QID) | ORAL | 0 refills | Status: DC

## 2023-11-15 MED ORDER — TRAMADOL HCL 50 MG PO TABS: 50.0000 mg | ORAL_TABLET | Freq: Four times a day (QID) | ORAL | 0 refills | Status: AC | PRN

## 2023-11-15 MED ORDER — TRAZODONE HCL 50 MG PO TABS: 100.0000 mg | ORAL_TABLET | Freq: Every day | ORAL | 1 refills | Status: DC

## 2023-11-15 NOTE — Progress Notes (Signed)
 Established Patient Office Visit     CC/Reason for Visit: Follow-up blood pressure  HPI: Kimberly [name] is a 83 y.o. female who is coming in today for the above mentioned reasons. Past Medical History is significant for: Hypertension.  She went to see an eye doctor and was told she had hypertensive retinopathy and was asked to follow-up with me in regards to her blood pressure.  She has not been doing ambulatory blood pressure measurements.  In office blood pressure today is 130/80.  She is also requesting refills of all of her medications.  She has an annual visit scheduled for May.   Past Medical/Surgical History: Past Medical History:  Diagnosis Date   Arthritis    Complication of anesthesia    Pt reports that she was told "someone had to breath for her" She is not clear what this means   Difficult intubation    Diverticulitis    GERD (gastroesophageal reflux disease)    History of COVID-19 05/2020   Hypercholesteremia    Hypertension    Mild cognitive impairment    Peripheral neuropathy    Pre-diabetes    Restless leg syndrome     Past Surgical History:  Procedure Laterality Date   ABDOMINAL HYSTERECTOMY     Age 89   BACK SURGERY  2004   blephroplasty     bilateral   BREAST BIOPSY Left    COLONOSCOPY WITH PROPOFOL N/A 11/13/2020   Procedure: COLONOSCOPY WITH PROPOFOL;  Surgeon: Jeani Hawking, MD;  Location: [facility];  Service: Endoscopy;  Laterality: N/A;   ESOPHAGOGASTRODUODENOSCOPY (EGD) WITH PROPOFOL N/A 11/13/2020   Procedure: ESOPHAGOGASTRODUODENOSCOPY (EGD) WITH PROPOFOL;  Surgeon: Jeani Hawking, MD;  [facility]: [facility];  Service: Endoscopy;  Laterality: N/A;   EYE SURGERY     bilateral cateract    POLYPECTOMY  11/13/2020   Procedure: POLYPECTOMY;  Surgeon: Jeani Hawking, MD;  [facility]: [facility];  Service: Endoscopy;;   REVERSE SHOULDER ARTHROPLASTY Left 04/07/2022   Procedure: REVERSE SHOULDER ARTHROPLASTY;  Surgeon: Jones Broom,  MD;  Location: [facility];  Service: Orthopedics;  Laterality: Left;   REVERSE SHOULDER ARTHROPLASTY Right 07/06/2023   Procedure: REVERSE SHOULDER ARTHROPLASTY;  Surgeon: Jones Broom, MD;  Location: [facility];  Service: Orthopedics;  Laterality: Right;   TONSILLECTOMY     As a child   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 She has never used smokeless tobacco. She reports that she does not drink alcohol and does not use drugs.  Allergies: Allergies  Allergen Reactions   Amoxicillin Swelling    Throat felt swollen   Ciprofloxacin Itching   Adhesive [Tape] Itching    Redness   Benzoyl Peroxide Itching   Metronidazole Hives   Oxycodone Other (See Comments)    Felt disoriented   Betadine [Povidone Iodine] Rash   Hydrocodone Itching and Anxiety   Sulfa Antibiotics Itching and Rash    Family History:  Family History  Problem Relation Age of Onset   Breast cancer Sister  Other Mother        passed away at age 66   Heart attack Father        passed away at age 66     Current Outpatient Medications:    acetaminophen (TYLENOL) 650 MG CR tablet, Take 650-1,300 mg by mouth See admin instructions. Take 650 mg in the AM, 650 mg at noon, and 1300 mg at bedtime., Disp: , Rfl:    amLODipine (NORVASC) 2.5 MG tablet, Take 2.5 mg by mouth every morning., Disp: , Rfl:    aspirin EC 81 MG tablet, Take 1 tablet (81 mg total) by mouth daily. Swallow whole., Disp: , Rfl:    diphenhydrAMINE  (BENADRYL) 25 mg capsule, Take 1-2 capsules (25-50 mg total) by mouth every 6 (six) hours as needed for itching (Dose with px medications)., Disp: 30 capsule, Rfl: 0   DULoxetine (CYMBALTA) 60 MG capsule, Take 1 capsule (60 mg total) by mouth daily., Disp: 90 capsule, Rfl: 3   gabapentin (NEURONTIN) 300 MG capsule, Take 1 capsule (300 mg total) by mouth 4 (four) times daily., Disp: 60 capsule, Rfl: 0   meloxicam (MOBIC) 15 MG tablet, Take 1 tablet (15 mg total) by mouth daily., Disp: 30 tablet, Rfl: 3   omeprazole (PRILOSEC) 40 MG capsule, Take 1 capsule (40 mg total) by mouth in the morning., Disp: 90 capsule, Rfl: 1   Polyethyl Glycol-Propyl Glycol (SYSTANE OP), Place 1 drop into both eyes every 6 (six) hours as needed (dry eyes)., Disp: , Rfl:    pramipexole (MIRAPEX) 0.25 MG tablet, Take 1 tablet (0.25 mg total) by mouth at bedtime., Disp: 90 tablet, Rfl: 1   simvastatin (ZOCOR) 20 MG tablet, Take 1 tablet (20 mg total) by mouth at bedtime., Disp: 30 tablet, Rfl: 0   traMADol (ULTRAM) 50 MG tablet, Take 1-2 tablets (50-100 mg total) by mouth every 6 (six) hours as needed for moderate pain (pain score 4-6)., Disp: 30 tablet, Rfl: 0   traZODone (DESYREL) 50 MG tablet, Take 2 tablets (100 mg total) by mouth at bedtime., Disp: 180 tablet, Rfl: 1   vitamin B-12 (CYANOCOBALAMIN) 500 MCG tablet, Take 500 mcg by mouth in the morning., Disp: , Rfl:   Review of Systems:  Negative unless indicated in HPI.   Physical Exam: Vitals:   11/15/23 1341  BP: 130/80  Pulse: 95  Temp: 98.2 F (36.8 C)  TempSrc: Oral  SpO2: 97%  Weight: 159 lb 9.6 oz (72.4 kg)    Body mass index is 28.27 kg/m.   Physical Exam   Impression and Plan:  Hypertensive retinopathy of right eye  Primary hypertension  Mixed hyperlipidemia  Restless leg syndrome -     DULoxetine HCl; Take 1 capsule (60 mg total) by mouth daily.  Dispense: 90 capsule; Refill: 3 -     Gabapentin; Take 1 capsule (300 mg total) by mouth  4 (four) times daily.  Dispense: 60 capsule; Refill: 0 -     Pramipexole Dihydrochloride; Take 1 tablet (0.25 mg total) by mouth at bedtime.  Dispense: 90 tablet; Refill: 1 -     traZODone HCl; Take 2 tablets (100 mg total) by mouth at bedtime.  Dispense: 180 tablet; Refill: 1 -     traMADol HCl; Take 1-2 tablets (50-100 mg total) by mouth every 6 (six) hours as needed for moderate pain (pain score 4-6).  Dispense: 30 tablet; Refill: 0 -     Meloxicam; Take 1 tablet (15 mg total) by mouth daily.  Dispense: 30 tablet; Refill: 3  Gastroesophageal reflux disease, unspecified whether esophagitis present -     Omeprazole; Take 1 capsule (40 mg total) by mouth in the morning.  Dispense: 90 capsule; Refill: 1  -No changes to antihypertensive regimen today based on today's measurements.  She will do ambulatory blood pressure monitoring and return in approximately 6 weeks for follow-up.   Time spent:30 minutes reviewing chart, interviewing and examining patient and formulating plan of care.     Chaya Jan, MD [facility] [place]

## 2024-01-08 ENCOUNTER — Encounter: Payer: Self-pay | Admitting: Internal Medicine

## 2024-01-08 ENCOUNTER — Ambulatory Visit (INDEPENDENT_AMBULATORY_CARE_PROVIDER_SITE_OTHER): Payer: Medicare Other | Admitting: Internal Medicine

## 2024-01-08 VITALS — BP 130/80 | HR 73 | Temp 98.2°F | Ht 64.0 in | Wt 162.6 lb

## 2024-01-08 DIAGNOSIS — Z780 Asymptomatic menopausal state: Secondary | ICD-10-CM

## 2024-01-08 DIAGNOSIS — G2581 Restless legs syndrome: Secondary | ICD-10-CM

## 2024-01-08 DIAGNOSIS — E538 Deficiency of other specified B group vitamins: Secondary | ICD-10-CM

## 2024-01-08 DIAGNOSIS — Z1231 Encounter for screening mammogram for malignant neoplasm of breast: Secondary | ICD-10-CM

## 2024-01-08 DIAGNOSIS — Z803 Family history of malignant neoplasm of breast: Secondary | ICD-10-CM | POA: Diagnosis not present

## 2024-01-08 DIAGNOSIS — Z13820 Encounter for screening for osteoporosis: Secondary | ICD-10-CM

## 2024-01-08 DIAGNOSIS — I10 Essential (primary) hypertension: Secondary | ICD-10-CM | POA: Diagnosis not present

## 2024-01-08 DIAGNOSIS — K219 Gastro-esophageal reflux disease without esophagitis: Secondary | ICD-10-CM

## 2024-01-08 DIAGNOSIS — E782 Mixed hyperlipidemia: Secondary | ICD-10-CM

## 2024-01-08 LAB — VITAMIN B12: Vitamin B-12: 223 pg/mL (ref 211–911)

## 2024-01-08 LAB — COMPREHENSIVE METABOLIC PANEL WITH GFR
ALT: 8 U/L (ref 0–35)
AST: 13 U/L (ref 0–37)
Albumin: 3.9 g/dL (ref 3.5–5.2)
Alkaline Phosphatase: 69 U/L (ref 39–117)
BUN: 21 mg/dL (ref 6–23)
CO2: 29 meq/L (ref 19–32)
Calcium: 8.9 mg/dL (ref 8.4–10.5)
Chloride: 106 meq/L (ref 96–112)
Creatinine, Ser: 0.76 mg/dL (ref 0.40–1.20)
GFR: 72.05 mL/min (ref >60.00)
Glucose, Bld: 94 mg/dL (ref 70–99)
Potassium: 4.6 meq/L (ref 3.5–5.1)
Sodium: 142 meq/L (ref 135–145)
Total Bilirubin: 0.3 mg/dL (ref 0.2–1.2)
Total Protein: 6.3 g/dL (ref 6.0–8.3)

## 2024-01-08 LAB — LIPID PANEL
Cholesterol: 172 mg/dL (ref 0–200)
HDL: 46.6 mg/dL (ref >39.00)
LDL Cholesterol: 98 mg/dL (ref 0–99)
NonHDL: 125.21
Total CHOL/HDL Ratio: 4
Triglycerides: 135 mg/dL (ref 0.0–149.0)
VLDL: 27 mg/dL (ref 0.0–40.0)

## 2024-01-08 LAB — CBC WITH DIFFERENTIAL/PLATELET
Basophils Absolute: 0 10*3/uL (ref 0.0–0.1)
Basophils Relative: 0.5 % (ref 0.0–3.0)
Eosinophils Absolute: 0.5 10*3/uL (ref 0.0–0.7)
Eosinophils Relative: 7 % — ABNORMAL HIGH (ref 0.0–5.0)
HCT: 35.7 % — ABNORMAL LOW (ref 36.0–46.0)
Hemoglobin: 11.8 g/dL — ABNORMAL LOW (ref 12.0–15.0)
Lymphocytes Relative: 23.3 % (ref 12.0–46.0)
Lymphs Abs: 1.5 10*3/uL (ref 0.7–4.0)
MCHC: 33.1 g/dL (ref 30.0–36.0)
MCV: 89.1 fl (ref 78.0–100.0)
Monocytes Absolute: 0.7 10*3/uL (ref 0.1–1.0)
Monocytes Relative: 11.3 % (ref 3.0–12.0)
Neutro Abs: 3.8 10*3/uL (ref 1.4–7.7)
Neutrophils Relative %: 57.9 % (ref 43.0–77.0)
Platelets: 324 10*3/uL (ref 150.0–400.0)
RBC: 4.01 Mil/uL (ref 3.87–5.11)
RDW: 14.7 % (ref 11.5–15.5)
WBC: 6.6 10*3/uL (ref 4.0–10.5)

## 2024-01-08 NOTE — Progress Notes (Signed)
 Established Patient Office Visit     CC/Reason for Visit: Follow-up chronic conditions  HPI: Kimberly [name] is a 83 y.o. female who is coming in today for the above mentioned reasons. Past Medical History is significant for: Hypertension, hyperlipidemia, GERD, restless leg syndrome.  She is feeling well.  She is due for Tdap and shingles vaccines.  She is due for an updated mammogram and bone density.   Past Medical/Surgical History: Past Medical History:  Diagnosis Date   Arthritis    Complication of anesthesia    Pt reports that she was told "someone had to breath for her" She is not clear what this means   Difficult intubation    Diverticulitis    GERD (gastroesophageal reflux disease)    History of COVID-19 05/2020   Hypercholesteremia    Hypertension    Mild cognitive impairment    Peripheral neuropathy    Pre-diabetes    Restless leg syndrome     Past Surgical History:  Procedure Laterality Date   ABDOMINAL HYSTERECTOMY     Age 57   BACK SURGERY  2004   blephroplasty     bilateral   BREAST BIOPSY Left    COLONOSCOPY WITH PROPOFOL  N/A 11/13/2020   Procedure: COLONOSCOPY WITH PROPOFOL ;  Surgeon: Alvis Jourdain, MD;  [facility];  Service: Endoscopy;  Laterality: N/A;   ESOPHAGOGASTRODUODENOSCOPY (EGD) WITH PROPOFOL  N/A 11/13/2020   Procedure: ESOPHAGOGASTRODUODENOSCOPY (EGD) WITH PROPOFOL ;  Surgeon: Alvis Jourdain, MD;  [facility];  Service: Endoscopy;  Laterality: N/A;   EYE SURGERY     bilateral cateract    POLYPECTOMY  11/13/2020   Procedure: POLYPECTOMY;  Surgeon: Alvis Jourdain, MD;  [facility];  Service: Endoscopy;;   REVERSE SHOULDER ARTHROPLASTY Left 04/07/2022   Procedure: REVERSE SHOULDER ARTHROPLASTY;  Surgeon: Sammye Cristal, MD;  [facility]: [facility];  Service: Orthopedics;  Laterality: Left;   REVERSE SHOULDER ARTHROPLASTY Right 07/06/2023   Procedure: REVERSE SHOULDER ARTHROPLASTY;  Surgeon: Sammye Cristal, MD;  [facility];  Service: Orthopedics;  Laterality: Right;   TONSILLECTOMY     As a child   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 She has never used smokeless tobacco. She reports that she does not drink alcohol  and does not use drugs.  Allergies: Allergies  Allergen Reactions   Amoxicillin Swelling    Throat felt swollen   Ciprofloxacin Itching   Adhesive [Tape] Itching    Redness   Benzoyl Peroxide Itching   Metronidazole Hives   Oxycodone  Other (See Comments)    Felt disoriented   Betadine  [Povidone Iodine ] Rash   Hydrocodone  Itching and Anxiety   Sulfa Antibiotics Itching and Rash    Family History:  Family History  Problem Relation Age of Onset   Breast cancer Sister    Other Mother        passed away at age 69   Heart attack Father        passed away at age 53     Current  Outpatient Medications:    acetaminophen  (TYLENOL ) 650 MG CR tablet, Take 650-1,300 mg by mouth See admin instructions. Take 650 mg in the AM, 650 mg at noon, and 1300 mg at bedtime., Disp: , Rfl:    amLODipine  (NORVASC ) 2.5 MG tablet, Take 2.5 mg by mouth every morning., Disp: , Rfl:    aspirin  EC 81 MG tablet, Take 1 tablet (81 mg total) by mouth daily. Swallow whole., Disp: , Rfl:    diphenhydrAMINE  (BENADRYL ) 25 mg capsule, Take 1-2 capsules (25-50 mg total) by mouth every 6 (six) hours as needed for itching (Dose with px medications)., Disp: 30 capsule, Rfl: 0   DULoxetine   (CYMBALTA ) 60 MG capsule, Take 1 capsule (60 mg total) by mouth daily., Disp: 90 capsule, Rfl: 3   gabapentin  (NEURONTIN ) 300 MG capsule, Take 1 capsule (300 mg total) by mouth 4 (four) times daily., Disp: 60 capsule, Rfl: 0   meloxicam  (MOBIC ) 15 MG tablet, Take 1 tablet (15 mg total) by mouth daily., Disp: 30 tablet, Rfl: 3   omeprazole  (PRILOSEC) 40 MG capsule, Take 1 capsule (40 mg total) by mouth in the morning., Disp: 90 capsule, Rfl: 1   Polyethyl Glycol-Propyl Glycol (SYSTANE OP), Place 1 drop into both eyes every 6 (six) hours as needed (dry eyes)., Disp: , Rfl:    pramipexole  (MIRAPEX ) 0.25 MG tablet, Take 1 tablet (0.25 mg total) by mouth at bedtime., Disp: 90 tablet, Rfl: 1   simvastatin  (ZOCOR ) 20 MG tablet, Take 1 tablet (20 mg total) by mouth at bedtime., Disp: 30 tablet, Rfl: 0   traMADol  (ULTRAM ) 50 MG tablet, Take 1-2 tablets (50-100 mg total) by mouth every 6 (six) hours as needed for moderate pain (pain score 4-6)., Disp: 30 tablet, Rfl: 0   traZODone  (DESYREL ) 50 MG tablet, Take 2 tablets (100 mg total) by mouth at bedtime., Disp: 180 tablet, Rfl: 1   vitamin B-12 (CYANOCOBALAMIN ) 500 MCG tablet, Take 500 mcg by mouth in the morning., Disp: , Rfl:   Review of Systems:  Negative unless indicated in HPI.   Physical Exam: Vitals:   01/08/24 0832  BP: 130/80  Pulse: 73  Temp: 98.2 F (36.8 C)  TempSrc: Oral  SpO2: 99%  Weight: 162 lb 9.6 oz (73.8 kg)  Height: 5\' 4"  (1.626 m)    Body mass index is 27.91 kg/m.   Physical Exam   Impression and Plan:  Primary hypertension -     CBC with Differential/Platelet; Future -     Comprehensive metabolic panel with GFR; Future  Mixed hyperlipidemia -     Lipid panel; Future  Gastroesophageal reflux disease, unspecified whether esophagitis present  B12 deficiency -     Vitamin B12; Future  Screening for osteoporosis  Encounter for screening mammogram for malignant neoplasm of breast -     Digital Screening  Mammogram, Left and Right; Future  Family history of breast cancer -     Digital Screening Mammogram, Left and Right; Future  Restless leg syndrome  Postmenopausal estrogen deficiency -     DG Bone Density; Future  -Mammogram and DEXA requested. - Advised to update Tdap and shingles at pharmacy. - Check labs.   Time spent:31 minutes reviewing chart, interviewing and examining patient and formulating plan of care.     Marguerita Shih, MD [facility]

## 2024-01-09 ENCOUNTER — Ambulatory Visit: Payer: Self-pay | Admitting: *Deleted

## 2024-01-11 ENCOUNTER — Ambulatory Visit

## 2024-01-11 DIAGNOSIS — E538 Deficiency of other specified B group vitamins: Secondary | ICD-10-CM

## 2024-01-11 MED ORDER — CYANOCOBALAMIN 1000 MCG/ML IJ SOLN
1000.0000 ug | Freq: Once | INTRAMUSCULAR | Status: AC
  Administered 2024-01-11: 1000 ug via INTRAMUSCULAR

## 2024-01-11 NOTE — Progress Notes (Signed)
 Per orders of Dr. Ardyth Harps, injection of B12 given by Kern Reap. Patient tolerated injection well.

## 2024-01-18 ENCOUNTER — Ambulatory Visit (INDEPENDENT_AMBULATORY_CARE_PROVIDER_SITE_OTHER)

## 2024-01-18 DIAGNOSIS — E538 Deficiency of other specified B group vitamins: Secondary | ICD-10-CM | POA: Diagnosis not present

## 2024-01-18 MED ORDER — CYANOCOBALAMIN 1000 MCG/ML IJ SOLN
1000.0000 ug | Freq: Once | INTRAMUSCULAR | Status: AC
  Administered 2024-01-18: 1000 ug via INTRAMUSCULAR

## 2024-01-18 NOTE — Progress Notes (Signed)
 Patient is in office today for a nurse visit for B12 Injection. Patient Injection was given in the  Right deltoid. Patient tolerated injection well.

## 2024-01-24 DIAGNOSIS — M1909 Primary osteoarthritis, other specified site: Secondary | ICD-10-CM | POA: Diagnosis not present

## 2024-01-25 ENCOUNTER — Ambulatory Visit (INDEPENDENT_AMBULATORY_CARE_PROVIDER_SITE_OTHER)

## 2024-01-25 DIAGNOSIS — E538 Deficiency of other specified B group vitamins: Secondary | ICD-10-CM

## 2024-01-25 MED ORDER — CYANOCOBALAMIN 1000 MCG/ML IJ SOLN
1000.0000 ug | Freq: Once | INTRAMUSCULAR | Status: AC
  Administered 2024-01-25: 1000 ug via INTRAMUSCULAR

## 2024-01-25 NOTE — Progress Notes (Signed)
 Patient is in office today for a nurse visit for B12 Injection. Patient Injection was given in the  Left deltoid. Patient tolerated injection well.

## 2024-01-31 ENCOUNTER — Ambulatory Visit
Admission: RE | Admit: 2024-01-31 | Discharge: 2024-01-31 | Disposition: A | Discharge status: Home / Self Care | Source: Ambulatory Visit | Attending: Internal Medicine | Admitting: Internal Medicine

## 2024-01-31 DIAGNOSIS — Z1231 Encounter for screening mammogram for malignant neoplasm of breast: Secondary | ICD-10-CM

## 2024-01-31 DIAGNOSIS — Z803 Family history of malignant neoplasm of breast: Secondary | ICD-10-CM

## 2024-02-01 ENCOUNTER — Ambulatory Visit

## 2024-02-02 ENCOUNTER — Ambulatory Visit

## 2024-02-02 DIAGNOSIS — E538 Deficiency of other specified B group vitamins: Secondary | ICD-10-CM

## 2024-02-02 MED ORDER — CYANOCOBALAMIN 1000 MCG/ML IJ SOLN
1000.0000 ug | Freq: Once | INTRAMUSCULAR | Status: AC
  Administered 2024-02-02: 1000 ug via INTRAMUSCULAR

## 2024-02-02 NOTE — Progress Notes (Signed)
 Per orders of Dr. Caryl Never, injection of Cyanocobalamin 1000 mcg given by Trudie Cervantes L Pesach Frisch. Patient tolerated injection well.

## 2024-02-06 ENCOUNTER — Ambulatory Visit (HOSPITAL_BASED_OUTPATIENT_CLINIC_OR_DEPARTMENT_OTHER)
Admission: RE | Admit: 2024-02-06 | Discharge: 2024-02-06 | Disposition: A | Discharge status: Home / Self Care | Source: Ambulatory Visit | Attending: Internal Medicine | Admitting: Internal Medicine

## 2024-02-06 DIAGNOSIS — Z780 Asymptomatic menopausal state: Secondary | ICD-10-CM | POA: Insufficient documentation

## 2024-02-06 DIAGNOSIS — M85852 Other specified disorders of bone density and structure, left thigh: Secondary | ICD-10-CM | POA: Diagnosis not present

## 2024-02-08 DIAGNOSIS — H40053 Ocular hypertension, bilateral: Secondary | ICD-10-CM | POA: Diagnosis not present

## 2024-03-07 ENCOUNTER — Ambulatory Visit

## 2024-03-07 DIAGNOSIS — E538 Deficiency of other specified B group vitamins: Secondary | ICD-10-CM

## 2024-03-07 MED ORDER — CYANOCOBALAMIN 1000 MCG/ML IJ SOLN
1000.0000 ug | Freq: Once | INTRAMUSCULAR | Status: AC
  Administered 2024-03-07: 1000 ug via INTRAMUSCULAR

## 2024-03-07 NOTE — Progress Notes (Signed)
 Patient is in office today for a nurse visit for B12 Injection. Patient Injection was given in the  Right deltoid. Patient tolerated injection well.

## 2024-03-19 ENCOUNTER — Other Ambulatory Visit: Payer: Self-pay | Admitting: Internal Medicine

## 2024-03-19 DIAGNOSIS — G2581 Restless legs syndrome: Secondary | ICD-10-CM

## 2024-04-05 ENCOUNTER — Ambulatory Visit (INDEPENDENT_AMBULATORY_CARE_PROVIDER_SITE_OTHER)

## 2024-04-05 ENCOUNTER — Other Ambulatory Visit

## 2024-04-05 DIAGNOSIS — E538 Deficiency of other specified B group vitamins: Secondary | ICD-10-CM

## 2024-04-05 MED ORDER — CYANOCOBALAMIN 1000 MCG/ML IJ SOLN
1000.0000 ug | Freq: Once | INTRAMUSCULAR | Status: AC
Start: 2024-04-05 — End: 2024-04-05
  Administered 2024-04-05: 1000 ug via INTRAMUSCULAR

## 2024-04-05 NOTE — Progress Notes (Signed)
 Patient is in office today for a nurse visit for B12 Injection. Patient Injection was given in the  Left deltoid. Patient tolerated injection well.

## 2024-04-22 ENCOUNTER — Encounter: Payer: Self-pay | Admitting: Internal Medicine

## 2024-05-06 ENCOUNTER — Ambulatory Visit

## 2024-05-06 ENCOUNTER — Ambulatory Visit (INDEPENDENT_AMBULATORY_CARE_PROVIDER_SITE_OTHER): Admitting: Internal Medicine

## 2024-05-06 ENCOUNTER — Encounter: Payer: Self-pay | Admitting: Internal Medicine

## 2024-05-06 VITALS — BP 165/86 | HR 83 | Temp 98.2°F | Wt 162.6 lb

## 2024-05-06 DIAGNOSIS — I10 Essential (primary) hypertension: Secondary | ICD-10-CM

## 2024-05-06 DIAGNOSIS — R7303 Prediabetes: Secondary | ICD-10-CM

## 2024-05-06 DIAGNOSIS — E538 Deficiency of other specified B group vitamins: Secondary | ICD-10-CM

## 2024-05-06 LAB — POCT GLYCOSYLATED HEMOGLOBIN (HGB A1C): Hemoglobin A1C: 5.8 % — AB (ref 4.0–5.6)

## 2024-05-06 MED ORDER — CYANOCOBALAMIN 1000 MCG/ML IJ SOLN
1000.0000 ug | Freq: Once | INTRAMUSCULAR | Status: AC
  Administered 2024-05-06: 1000 ug via INTRAMUSCULAR

## 2024-05-06 NOTE — Progress Notes (Signed)
 Established Patient Office Visit     CC/Reason for Visit: I think I am here for a B12 shot  HPI: Kimberly [name] is a 83 y.o. female who is coming in today for the above mentioned reasons. Past Medical History is significant for: Hypertension, hyperlipidemia, restless leg syndrome GERD, history of B12 deficiency and impaired glucose tolerance.  This visit was scheduled by her daughter due to concerns with her elderly age and driving as well as wanting her placed in assisted living.  Patient is upset that her daughter brought her here under false pretenses.  She states she has never had any issues with driving, has never been involved in an accident or received any tickets.  She is not interested in moving into assisted living as of yet.  She would not like me speaking with her daughter at this point.  She retains full mental capacity.   Past Medical/Surgical History: Past Medical History:  Diagnosis Date   Arthritis    Complication of anesthesia    Pt reports that she was told someone had to breath for her She is not clear what this means   Difficult intubation    Diverticulitis    GERD (gastroesophageal reflux disease)    History of COVID-19 05/2020   Hypercholesteremia    Hypertension    Mild cognitive impairment    Peripheral neuropathy    Pre-diabetes    Restless leg syndrome     Past Surgical History:  Procedure Laterality Date   ABDOMINAL HYSTERECTOMY     Age 61   BACK SURGERY  2004   blephroplasty     bilateral   BREAST BIOPSY Left    COLONOSCOPY WITH PROPOFOL  N/A 11/13/2020   Procedure: COLONOSCOPY WITH PROPOFOL ;  Surgeon: Rollin Dover, MD;  [facility];  Service: Endoscopy;  Laterality: N/A;   ESOPHAGOGASTRODUODENOSCOPY (EGD) WITH PROPOFOL  N/A 11/13/2020   Procedure: ESOPHAGOGASTRODUODENOSCOPY (EGD) WITH PROPOFOL ;  Surgeon: Rollin Dover, MD;  [facility];  Service: Endoscopy;  Laterality: N/A;   EYE SURGERY     bilateral  cateract    POLYPECTOMY  11/13/2020   Procedure: POLYPECTOMY;  Surgeon: Rollin Dover, MD;  [facility];  Service: Endoscopy;;   REVERSE SHOULDER ARTHROPLASTY Left 04/07/2022   Procedure: REVERSE SHOULDER ARTHROPLASTY;  Surgeon: Dozier Soulier, MD;  [facility];  Service: Orthopedics;  Laterality: Left;   REVERSE SHOULDER ARTHROPLASTY Right 07/06/2023   Procedure: REVERSE SHOULDER ARTHROPLASTY;  Surgeon: Dozier Soulier, MD;  [facility];  Service: Orthopedics;  Laterality: Right;   TONSILLECTOMY     As a child   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 She has never used smokeless tobacco. She reports that she does not drink alcohol  and does not use drugs.  Allergies: Allergies  Allergen Reactions   Amoxicillin Swelling    Throat felt swollen   Ciprofloxacin Itching   Adhesive [Tape] Itching    Redness   Benzoyl Peroxide Itching   Metronidazole Hives   Oxycodone  Other (See Comments)  Felt disoriented   Betadine  [Povidone Iodine ] Rash   Hydrocodone  Itching and Anxiety   Sulfa Antibiotics Itching and Rash    Family History:  Family History  Problem Relation Age of Onset   Breast cancer Sister    Other Mother        passed away at age 75   Heart attack Father        passed away at age 34     Current Outpatient Medications:    acetaminophen  (TYLENOL ) 650 MG CR tablet, Take 650-1,300 mg by mouth See admin instructions. Take 650 mg in the AM, 650 mg at  noon, and 1300 mg at bedtime., Disp: , Rfl:    amLODipine  (NORVASC ) 2.5 MG tablet, Take 2.5 mg by mouth every morning., Disp: , Rfl:    aspirin  EC 81 MG tablet, Take 1 tablet (81 mg total) by mouth daily. Swallow whole., Disp: , Rfl:    diphenhydrAMINE  (BENADRYL ) 25 mg capsule, Take 1-2 capsules (25-50 mg total) by mouth every 6 (six) hours as needed for itching (Dose with px medications)., Disp: 30 capsule, Rfl: 0   DULoxetine  (CYMBALTA ) 60 MG capsule, Take 1 capsule (60 mg total) by mouth daily., Disp: 90 capsule, Rfl: 3   gabapentin  (NEURONTIN ) 300 MG capsule, Take 1 capsule (300 mg total) by mouth 4 (four) times daily., Disp: 60 capsule, Rfl: 0   meloxicam  (MOBIC ) 15 MG tablet, TAKE 1 TABLET (15 MG TOTAL) BY MOUTH DAILY., Disp: 30 tablet, Rfl: 3   omeprazole  (PRILOSEC) 40 MG capsule, Take 1 capsule (40 mg total) by mouth in the morning., Disp: 90 capsule, Rfl: 1   Polyethyl Glycol-Propyl Glycol (SYSTANE OP), Place 1 drop into both eyes every 6 (six) hours as needed (dry eyes)., Disp: , Rfl:    pramipexole  (MIRAPEX ) 0.25 MG tablet, Take 1 tablet (0.25 mg total) by mouth at bedtime., Disp: 90 tablet, Rfl: 1   simvastatin  (ZOCOR ) 20 MG tablet, Take 1 tablet (20 mg total) by mouth at bedtime., Disp: 30 tablet, Rfl: 0   traMADol  (ULTRAM ) 50 MG tablet, Take 1-2 tablets (50-100 mg total) by mouth every 6 (six) hours as needed for moderate pain (pain score 4-6)., Disp: 30 tablet, Rfl: 0   traZODone  (DESYREL ) 50 MG tablet, Take 2 tablets (100 mg total) by mouth at bedtime., Disp: 180 tablet, Rfl: 1   vitamin B-12 (CYANOCOBALAMIN ) 500 MCG tablet, Take 500 mcg by mouth in the morning., Disp: , Rfl:   Review of Systems:  Negative unless indicated in HPI.   Physical Exam: Vitals:   05/06/24 0946 05/06/24 0949  BP: (!) 150/100 (!) 165/86  Pulse: 83   Temp: 98.2 F (36.8 C)   TempSrc: Oral   SpO2: 97%   Weight: 162 lb 9.6 oz (73.8 kg)     Body mass index is 27.91 kg/m.     Impression and  Plan:  Prediabetes -     POCT glycosylated hemoglobin (Hb A1C)  B12 deficiency  Primary hypertension   - Respecting patient's wishes I will not discuss further concerns with her daughter unless she explicitly allows me to. - A1c of 5.8 demonstrates stability in the prediabetic range. - Blood pressure is elevated, she believes it is because she is upset in regards to the issues with her daughter today.  Will continue to monitor.  Time spent:31 minutes reviewing chart, interviewing and examining patient and formulating plan of care.     Tully Theophilus Andrews, MD [facility]

## 2024-05-06 NOTE — Addendum Note (Signed)
 Addended by: KATHRYNE MILLMAN B on: 05/06/2024 12:38 PM   Modules accepted: Orders

## 2024-05-18 ENCOUNTER — Other Ambulatory Visit: Payer: Self-pay | Admitting: Internal Medicine

## 2024-05-18 DIAGNOSIS — G2581 Restless legs syndrome: Secondary | ICD-10-CM

## 2024-06-12 ENCOUNTER — Other Ambulatory Visit: Payer: Self-pay | Admitting: Internal Medicine

## 2024-06-12 DIAGNOSIS — G2581 Restless legs syndrome: Secondary | ICD-10-CM

## 2024-06-12 DIAGNOSIS — K219 Gastro-esophageal reflux disease without esophagitis: Secondary | ICD-10-CM

## 2024-06-14 DIAGNOSIS — H26492 Other secondary cataract, left eye: Secondary | ICD-10-CM | POA: Diagnosis not present

## 2024-06-14 DIAGNOSIS — H04123 Dry eye syndrome of bilateral lacrimal glands: Secondary | ICD-10-CM | POA: Diagnosis not present

## 2024-06-14 DIAGNOSIS — H40013 Open angle with borderline findings, low risk, bilateral: Secondary | ICD-10-CM | POA: Diagnosis not present

## 2024-06-19 DIAGNOSIS — M19072 Primary osteoarthritis, left ankle and foot: Secondary | ICD-10-CM | POA: Diagnosis not present

## 2024-07-03 ENCOUNTER — Ambulatory Visit (INDEPENDENT_AMBULATORY_CARE_PROVIDER_SITE_OTHER): Admitting: Family Medicine

## 2024-07-03 ENCOUNTER — Ambulatory Visit: Payer: Self-pay

## 2024-07-03 ENCOUNTER — Encounter: Payer: Self-pay | Admitting: Family Medicine

## 2024-07-03 VITALS — BP 144/84 | HR 78 | Temp 98.2°F | Wt 158.5 lb

## 2024-07-03 DIAGNOSIS — R6883 Chills (without fever): Secondary | ICD-10-CM

## 2024-07-03 DIAGNOSIS — E538 Deficiency of other specified B group vitamins: Secondary | ICD-10-CM | POA: Diagnosis not present

## 2024-07-03 DIAGNOSIS — R61 Generalized hyperhidrosis: Secondary | ICD-10-CM | POA: Diagnosis not present

## 2024-07-03 LAB — POC URINALSYSI DIPSTICK (AUTOMATED)
Bilirubin, UA: POSITIVE
Blood, UA: NEGATIVE
Glucose, UA: NEGATIVE
Ketones, UA: POSITIVE
Nitrite, UA: NEGATIVE
Protein, UA: POSITIVE — AB
Spec Grav, UA: 1.02 (ref 1.010–1.025)
Urobilinogen, UA: 1 U/dL
pH, UA: 6 (ref 5.0–8.0)

## 2024-07-03 MED ORDER — NITROFURANTOIN MONOHYD MACRO 100 MG PO CAPS: 100.0000 mg | ORAL_CAPSULE | Freq: Two times a day (BID) | ORAL | 0 refills | Status: AC

## 2024-07-03 MED ORDER — CYANOCOBALAMIN 1000 MCG/ML IJ SOLN
1000.0000 ug | Freq: Once | INTRAMUSCULAR | Status: AC
  Administered 2024-07-03: 1000 ug via INTRAMUSCULAR

## 2024-07-03 NOTE — Progress Notes (Unsigned)
 Established Patient Office Visit  Subjective   Patient ID: Kimberly [name], female    DOB: 1941-05-26  Age: 83 y.o. MRN: [id number]  Chief Complaint  Patient presents with   Excessive Sweating    HPI  {History (Optional):23778} Kimberly [name] is seen today with what she describes as sweats intermittently for the past 5 days.  She will suddenly feel hot and has sweats and occasional chills but no documented fever.  She has not actually taken her temperature.  She has had some nasal congestion.  Minimal if any cough.  Denies any dysuria, stool change, nausea, vomiting, abdominal pain, or any rashes.  No known sick contacts.  Past Medical History:  Diagnosis Date   Arthritis    Complication of anesthesia    Pt reports that she was told someone had to breath for her She is not clear what this means   Difficult intubation    Diverticulitis    GERD (gastroesophageal reflux disease)    History of COVID-19 05/2020   Hypercholesteremia    Hypertension    Mild cognitive impairment    Peripheral neuropathy    Pre-diabetes    Restless leg syndrome    Past Surgical History:  Procedure Laterality Date   ABDOMINAL HYSTERECTOMY     Age 13   BACK SURGERY  2004   blephroplasty     bilateral   BREAST BIOPSY Left    COLONOSCOPY WITH PROPOFOL  N/A 11/13/2020   Procedure: COLONOSCOPY WITH PROPOFOL ;  Surgeon: Rollin Dover, MD;  Location: [facility];  Service: Endoscopy;  Laterality: N/A;   ESOPHAGOGASTRODUODENOSCOPY (EGD) WITH PROPOFOL  N/A 11/13/2020   Procedure: ESOPHAGOGASTRODUODENOSCOPY (EGD) WITH PROPOFOL ;  Surgeon: Rollin Dover, MD;  Location: [facility];  Service: Endoscopy;  Laterality: N/A;   EYE SURGERY     bilateral cateract    POLYPECTOMY  11/13/2020   Procedure: POLYPECTOMY;  Surgeon: Rollin Dover, MD;  Location: [facility];  Service: Endoscopy;;   REVERSE SHOULDER ARTHROPLASTY Left 04/07/2022   Procedure: REVERSE SHOULDER ARTHROPLASTY;  Surgeon: Dozier Soulier,  MD;  Location: [facility];  Service: Orthopedics;  Laterality: Left;   REVERSE SHOULDER ARTHROPLASTY Right 07/06/2023   Procedure: REVERSE SHOULDER ARTHROPLASTY;  Surgeon: Dozier Soulier, MD;  Location: [facility];  Service: Orthopedics;  Laterality: Right;   TONSILLECTOMY     As a child   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   Procedure: TOTAL KNEE ARTHROPLASTY;  Surgeon: Ernie Cough, MD;  Location: [facility];  Service: Orthopedics;  Laterality: Left;  70 mins   TRANSFORAMINAL LUMBAR INTERBODY FUSION (TLIF) WITH PEDICLE SCREW FIXATION 1 LEVEL Right 01/12/2023   Procedure: RIGHT-SIDED TRSANSFORAMINAL LUMBAR INTERBODY FUSION LUMBAR 4 - LUMBAR 5  WITH INSTRUMENTATION AND ALLOGRAFT;  Surgeon: Beuford Anes, MD;  Location: [facility];  Service: Orthopedics;  Laterality: Right;    reports that she has never smoked. She has never used smokeless tobacco. She reports that she does not drink alcohol  and does not use drugs. family history includes Breast cancer in her sister; Heart attack in her father; Other in her mother. Allergies  Allergen Reactions   Amoxicillin Swelling    Throat felt swollen   Ciprofloxacin Itching   Adhesive [Tape] Itching    Redness   Benzoyl Peroxide Itching   Metronidazole Hives   Oxycodone  Other (See Comments)    Felt disoriented   Betadine  [Povidone Iodine ] Rash   Hydrocodone  Itching and Anxiety   Sulfa Antibiotics Itching and Rash    Review of Systems  Constitutional:  Positive  for chills and malaise/fatigue. Negative for weight loss.  HENT:  Positive for congestion. Negative for ear pain and sore throat.   Respiratory:  Negative for hemoptysis and shortness of breath.   Cardiovascular:  Negative for chest pain.  Gastrointestinal:  Negative for abdominal pain, blood in stool, diarrhea, nausea and vomiting.  Genitourinary:  Negative for dysuria, flank pain and hematuria.      Objective:     BP (!) 144/84   Pulse 88   Temp 98.2 F (36.8 C) (Oral)   Wt 158 lb 8  oz (71.9 kg)   SpO2 96%   BMI 27.21 kg/m  BP Readings from Last 3 Encounters:  07/03/24 (!) 144/84  05/06/24 (!) 165/86  01/08/24 130/80   Wt Readings from Last 3 Encounters:  07/03/24 158 lb 8 oz (71.9 kg)  05/06/24 162 lb 9.6 oz (73.8 kg)  01/08/24 162 lb 9.6 oz (73.8 kg)      Physical Exam Vitals reviewed.  Constitutional:      General: She is not in acute distress.    Appearance: She is not ill-appearing.  HENT:     Right Ear: Tympanic membrane normal.     Left Ear: Tympanic membrane normal.     Mouth/Throat:     Pharynx: Oropharynx is clear. No oropharyngeal exudate or posterior oropharyngeal erythema.  Cardiovascular:     Rate and Rhythm: Normal rate and regular rhythm.  Pulmonary:     Effort: Pulmonary effort is normal.     Breath sounds: Normal breath sounds. No wheezing or rales.  Abdominal:     Palpations: Abdomen is soft.     Tenderness: There is no abdominal tenderness.  Musculoskeletal:     Cervical back: Neck supple.  Lymphadenopathy:     Cervical: No cervical adenopathy.  Skin:    Findings: No rash.  Neurological:     Mental Status: She is alert.      No results found for any visits on 07/03/24.  {Labs (Optional):23779}  The ASCVD Risk score (Arnett DK, et al., 2019) failed to calculate for the following reasons:   The 2019 ASCVD risk score is only valid for ages 25 to 81    Assessment & Plan:   83 year old female who is seen today with 5-day history of nonspecific sweats and intermittent diaphoresis and chills without documented fever.  She does mention some clear nasal discharge but no bodyaches, cough, sore throat, or other typical URI type symptoms.  She is nontoxic in appearance.  Nonfocal exam.  -Check urine dipstick  Wolm Scarlet, MD

## 2024-07-03 NOTE — Telephone Encounter (Signed)
 FYI Only or Action Required?: FYI only for provider: appointment scheduled on 07/03/2024.  Patient was last seen in primary care on 05/06/2024 by Kimberly [name], Tully GRADE, MD.  Called Nurse Triage reporting Fever and Fatigue.  Symptoms began several days ago.  Interventions attempted: Rest, hydration, or home remedies.  Symptoms are: unchanged.  Triage Disposition: See HCP Within 4 Hours (Or PCP Triage)  Patient/caregiver understands and will follow disposition?: Yes  Copied from CRM [id number]. Topic: Clinical - Red Word Triage >> Jul 03, 2024  9:58 AM Charolett L wrote: [facility] that prompted transfer to Nurse Triage: fever, fatigue, sweating Reason for Disposition  [1] MODERATE weakness (e.g., interferes with work, school, normal activities) AND [2] cause unknown  (Exceptions: Weakness from acute minor illness or poor fluid intake; weakness is chronic and not worse.)  Answer Assessment - Initial Assessment Questions 1. DESCRIPTION: Describe how you are feeling.     fatigue 2. SEVERITY: How bad is it?  Can you stand and walk?     Yes, able to stand and walk 3. ONSET: When did these symptoms begin? (e.g., hours, days, weeks, months)     4-5 days ago 4. CAUSE: What do you think is causing the weakness or fatigue? (e.g., not drinking enough fluids, medical problem, trouble sleeping)     unsure 5. NEW MEDICINES:  Have you started on any new medicines recently? (e.g., opioid pain medicines, benzodiazepines, muscle relaxants, antidepressants, antihistamines, neuroleptics, beta blockers)     denies 6. OTHER SYMPTOMS: Do you have any other symptoms? (e.g., chest pain, fever, cough, SOB, vomiting, diarrhea, bleeding, other areas of pain)     Fever, temp not measured, sweating  Protocols used: Weakness (Generalized) and Fatigue-A-AH

## 2024-07-03 NOTE — Telephone Encounter (Signed)
 Noted

## 2024-07-04 LAB — URINE CULTURE
MICRO NUMBER:: 17194638
Result:: NO GROWTH
SPECIMEN QUALITY:: ADEQUATE

## 2024-07-05 ENCOUNTER — Ambulatory Visit: Payer: Self-pay | Admitting: Family Medicine

## 2024-07-11 ENCOUNTER — Ambulatory Visit: Admitting: Internal Medicine

## 2024-07-15 ENCOUNTER — Other Ambulatory Visit: Payer: Self-pay | Admitting: Internal Medicine

## 2024-07-15 ENCOUNTER — Ambulatory Visit (INDEPENDENT_AMBULATORY_CARE_PROVIDER_SITE_OTHER): Admitting: Internal Medicine

## 2024-07-15 ENCOUNTER — Encounter: Payer: Self-pay | Admitting: Internal Medicine

## 2024-07-15 VITALS — BP 151/85 | HR 94 | Temp 98.2°F | Wt 159.3 lb

## 2024-07-15 DIAGNOSIS — G2581 Restless legs syndrome: Secondary | ICD-10-CM

## 2024-07-15 DIAGNOSIS — J302 Other seasonal allergic rhinitis: Secondary | ICD-10-CM | POA: Diagnosis not present

## 2024-07-15 MED ORDER — CETIRIZINE HCL 10 MG PO TABS: 10.0000 mg | ORAL_TABLET | Freq: Every day | ORAL | 11 refills | Status: AC

## 2024-07-15 NOTE — Progress Notes (Signed)
 Established Patient Office Visit     CC/Reason for Visit: Postnasal drip, cough, excessive nasal discharge  HPI: Kimberly [name] is a 83 y.o. female who is coming in today for the above mentioned reasons.  Above symptoms have been present for about 3 weeks.  She has not tried anything over-the-counter.  No sick contacts or recent travel, no fever, no headache.   Past Medical/Surgical History: Past Medical History:  Diagnosis Date   Arthritis    Complication of anesthesia    Pt reports that she was told someone had to breath for her She is not clear what this means   Difficult intubation    Diverticulitis    GERD (gastroesophageal reflux disease)    History of COVID-19 05/2020   Hypercholesteremia    Hypertension    Mild cognitive impairment    Peripheral neuropathy    Pre-diabetes    Restless leg syndrome     Past Surgical History:  Procedure Laterality Date   ABDOMINAL HYSTERECTOMY     Age 20   BACK SURGERY  2004   blephroplasty     bilateral   BREAST BIOPSY Left    COLONOSCOPY WITH PROPOFOL  N/A 11/13/2020   Procedure: COLONOSCOPY WITH PROPOFOL ;  Surgeon: Rollin Dover, MD;  Location: [facility];  Service: Endoscopy;  Laterality: N/A;   ESOPHAGOGASTRODUODENOSCOPY (EGD) WITH PROPOFOL  N/A 11/13/2020   Procedure: ESOPHAGOGASTRODUODENOSCOPY (EGD) WITH PROPOFOL ;  Surgeon: Rollin Dover, MD;  Location: [facility];  Service: Endoscopy;  Laterality: N/A;   EYE SURGERY     bilateral cateract    POLYPECTOMY  11/13/2020   Procedure: POLYPECTOMY;  Surgeon: Rollin Dover, MD;  Location: [facility];  Service: Endoscopy;;   REVERSE SHOULDER ARTHROPLASTY Left 04/07/2022   Procedure: REVERSE SHOULDER ARTHROPLASTY;  Surgeon: Dozier Soulier, MD;  Location: [facility];  Service: Orthopedics;  Laterality: Left;   REVERSE SHOULDER ARTHROPLASTY Right 07/06/2023   Procedure: REVERSE SHOULDER ARTHROPLASTY;  Surgeon: Dozier Soulier, MD;  Location: [facility];  Service:  Orthopedics;  Laterality: Right;   TONSILLECTOMY     As a child   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 She has never used smokeless tobacco. She reports that she does not drink alcohol  and does not use drugs.  Allergies: Allergies  Allergen Reactions   Amoxicillin Swelling    Throat felt swollen   Ciprofloxacin Itching   Adhesive [Tape] Itching    Redness   Benzoyl Peroxide Itching   Metronidazole Hives   Oxycodone  Other (See Comments)    Felt disoriented   Betadine  [Povidone Iodine ] Rash   Hydrocodone  Itching and Anxiety   Sulfa Antibiotics Itching and Rash    Family History:  Family History  Problem Relation Age of Onset   Breast cancer Sister    Other Mother        passed away at age 80   Heart attack Father        passed away at age 89     Current Outpatient Medications:    acetaminophen  (  TYLENOL ) 650 MG CR tablet, Take 650-1,300 mg by mouth See admin instructions. Take 650 mg in the AM, 650 mg at noon, and 1300 mg at bedtime., Disp: , Rfl:    amLODipine  (NORVASC ) 2.5 MG tablet, Take 2.5 mg by mouth every morning., Disp: , Rfl:    aspirin  EC 81 MG tablet, Take 1 tablet (81 mg total) by mouth daily. Swallow whole., Disp: , Rfl:    cetirizine (ZYRTEC) 10 MG tablet, Take 1 tablet (10 mg total) by mouth daily., Disp: 30 tablet, Rfl: 11   diphenhydrAMINE  (BENADRYL ) 25 mg capsule, Take 1-2 capsules (25-50 mg total) by mouth every 6 (six) hours as needed for itching (Dose  with px medications)., Disp: 30 capsule, Rfl: 0   DULoxetine  (CYMBALTA ) 60 MG capsule, Take 1 capsule (60 mg total) by mouth daily., Disp: 90 capsule, Rfl: 3   gabapentin  (NEURONTIN ) 300 MG capsule, Take 1 capsule (300 mg total) by mouth 4 (four) times daily., Disp: 60 capsule, Rfl: 0   meloxicam  (MOBIC ) 15 MG tablet, TAKE 1 TABLET (15 MG TOTAL) BY MOUTH DAILY., Disp: 30 tablet, Rfl: 3   omeprazole  (PRILOSEC) 40 MG capsule, TAKE 1 CAPSULE (40 MG TOTAL) BY MOUTH IN THE MORNING, Disp: 90 capsule, Rfl: 1   Polyethyl Glycol-Propyl Glycol (SYSTANE OP), Place 1 drop into both eyes every 6 (six) hours as needed (dry eyes)., Disp: , Rfl:    pramipexole  (MIRAPEX ) 0.25 MG tablet, TAKE 1 TABLET BY MOUTH AT BEDTIME., Disp: 90 tablet, Rfl: 1   simvastatin  (ZOCOR ) 20 MG tablet, Take 1 tablet (20 mg total) by mouth at bedtime., Disp: 30 tablet, Rfl: 0   traMADol  (ULTRAM ) 50 MG tablet, Take 1-2 tablets (50-100 mg total) by mouth every 6 (six) hours as needed for moderate pain (pain score 4-6)., Disp: 30 tablet, Rfl: 0   traZODone  (DESYREL ) 50 MG tablet, TAKE 2 TABLETS BY MOUTH AT BEDTIME., Disp: 180 tablet, Rfl: 1   vitamin B-12 (CYANOCOBALAMIN ) 500 MCG tablet, Take 500 mcg by mouth in the morning., Disp: , Rfl:    nitrofurantoin, macrocrystal-monohydrate, (MACROBID) 100 MG capsule, Take 1 capsule (100 mg total) by mouth 2 (two) times daily., Disp: 10 capsule, Rfl: 0  Review of Systems:  Negative unless indicated in HPI.   Physical Exam: Vitals:   07/15/24 1116 07/15/24 1120  BP: (!) 160/84 (!) 151/85  Pulse: 94   Temp: 98.2 F (36.8 C)   TempSrc: Oral   SpO2: 98%   Weight: 159 lb 4.8 oz (72.3 kg)     Body mass index is 27.34 kg/m.   Physical Exam Pulmonary:     Effort: Pulmonary effort is normal.     Breath sounds: Normal breath sounds.      Impression and Plan:  Seasonal allergies -     Cetirizine HCl; Take 1 tablet (10 mg total) by mouth daily.  Dispense: 30 tablet; Refill: 11   -  Suspect symptoms are related to seasonal allergies.  Have advised daily cetirizine use and follow-up if not better in a few weeks.  Time spent:22 minutes reviewing chart, interviewing and examining patient and formulating plan of care.     Tully Theophilus Andrews, MD [facility]

## 2024-08-01 ENCOUNTER — Ambulatory Visit

## 2024-08-01 DIAGNOSIS — E538 Deficiency of other specified B group vitamins: Secondary | ICD-10-CM

## 2024-08-01 MED ORDER — CYANOCOBALAMIN 1000 MCG/ML IJ SOLN
1000.0000 ug | Freq: Once | INTRAMUSCULAR | Status: AC
  Administered 2024-08-01: 1000 ug via INTRAMUSCULAR

## 2024-08-01 NOTE — Patient Instructions (Signed)
 Patient is in office today for a nurse visit for B12 Injection. Patient Injection was given in the  Right deltoid. Patient tolerated injection well.

## 2024-08-02 ENCOUNTER — Ambulatory Visit

## 2024-08-19 ENCOUNTER — Ambulatory Visit

## 2024-08-30 ENCOUNTER — Ambulatory Visit

## 2024-08-30 VITALS — BP 122/62 | HR 82 | Temp 98.1°F | Ht 62.0 in | Wt 161.2 lb

## 2024-08-30 DIAGNOSIS — Z0000 Encounter for general adult medical examination without abnormal findings: Secondary | ICD-10-CM

## 2024-08-30 NOTE — Progress Notes (Signed)
 "  Chief Complaint  Patient presents with   Medicare Wellness     Subjective:   Kimberly [name] is a 84 y.o. female who presents for a Medicare Annual Wellness Visit.  Visit info / Clinical Intake: Medicare Wellness Visit Type:: Subsequent Annual Wellness Visit Persons participating in visit and providing information:: patient Medicare Wellness Visit Mode:: In-person (required for WTM) Interpreter Needed?: No Pre-visit prep was completed: yes AWV questionnaire completed by patient prior to visit?: no Living arrangements:: (!) lives alone Patient's Overall Health Status Rating: good Typical amount of pain: some Does pain affect daily life?: no Are you currently prescribed opioids?: no  Dietary Habits and Nutritional Risks How many meals a day?: 3 Eats fruit and vegetables daily?: yes Most meals are obtained by: preparing own meals; having others provide food In the last 2 weeks, have you had any of the following?: none Diabetic:: no  Functional Status Activities of Daily Living (to include ambulation/medication): Independent Ambulation: Independent with device- listed below Home Assistive Devices/Equipment: Eyeglasses Medication Administration: Independent Home Management (perform basic housework or laundry): Independent Manage your own finances?: yes Primary transportation is: driving Concerns about vision?: no *vision screening is required for WTM* Concerns about hearing?: no  Fall Screening Falls in the past year?: 0 Number of falls in past year: 0 Was there an injury with Fall?: 0 Fall Risk Category Calculator: 0 Patient Fall Risk Level: Low Fall Risk  Fall Risk Patient at Risk for Falls Due to: No Fall Risks Fall risk Follow up: Falls evaluation completed  Home and Transportation Safety: All rugs have non-skid backing?: yes All stairs or steps have railings?: N/A, no stairs Grab bars in the bathtub or shower?: (!) no Have non-skid surface in bathtub or  shower?: yes Good home lighting?: yes Regular seat belt use?: yes Hospital stays in the last year:: no  Cognitive Assessment Difficulty concentrating, remembering, or making decisions? : yes Will 6CIT or Mini Cog be Completed: yes What year is it?: 4 points What month is it?: 0 points Give patient an address phrase to remember (5 components): [address] [place]  About what time is it?: 0 points Count backwards from 20 to 1: 0 points Say the months of the year in reverse: 0 points Repeat the address phrase from earlier: 0 points 6 CIT Score: 4 points  Advance Directives (For Healthcare) Does Patient Have a Medical Advance Directive?: Yes Does patient want to make changes to medical advance directive?: No - Patient declined Type of Advance Directive: Healthcare Power of [place]; Living will Copy of Healthcare Power of Attorney in Chart?: No - copy requested Copy of Living Will in Chart?: No - copy requested  Reviewed/Updated  Reviewed/Updated: Reviewed All (Medical, Surgical, Family, Medications, Allergies, Care Teams, Patient Goals)    Allergies (verified) Amoxicillin, Ciprofloxacin, Adhesive [tape], Benzoyl peroxide, Metronidazole, Oxycodone , Betadine  [povidone iodine ], Hydrocodone , and Sulfa antibiotics   Current Medications (verified) Outpatient Encounter Medications as of 08/30/2024  Medication Sig   acetaminophen  (TYLENOL ) 650 MG CR tablet Take 650-1,300 mg by mouth See admin instructions. Take 650 mg in the AM, 650 mg at noon, and 1300 mg at bedtime.   amLODipine  (NORVASC ) 2.5 MG tablet Take 2.5 mg by mouth every morning.   aspirin  EC 81 MG tablet Take 1 tablet (81 mg total) by mouth daily. Swallow whole.   cetirizine  (ZYRTEC ) 10 MG tablet Take 1 tablet (10 mg total) by mouth daily.   diphenhydrAMINE  (BENADRYL ) 25 mg capsule Take 1-2 capsules (25-50  mg total) by mouth every 6 (six) hours as needed for itching (Dose with px medications).   DULoxetine  (CYMBALTA ) 60  MG capsule Take 1 capsule (60 mg total) by mouth daily.   gabapentin  (NEURONTIN ) 300 MG capsule Take 1 capsule (300 mg total) by mouth 4 (four) times daily.   meloxicam  (MOBIC ) 15 MG tablet TAKE 1 TABLET (15 MG TOTAL) BY MOUTH DAILY.   nitrofurantoin , macrocrystal-monohydrate, (MACROBID ) 100 MG capsule Take 1 capsule (100 mg total) by mouth 2 (two) times daily.   omeprazole  (PRILOSEC) 40 MG capsule TAKE 1 CAPSULE (40 MG TOTAL) BY MOUTH IN THE MORNING   Polyethyl Glycol-Propyl Glycol (SYSTANE OP) Place 1 drop into both eyes every 6 (six) hours as needed (dry eyes).   pramipexole  (MIRAPEX ) 0.25 MG tablet TAKE 1 TABLET BY MOUTH AT BEDTIME.   simvastatin  (ZOCOR ) 20 MG tablet Take 1 tablet (20 mg total) by mouth at bedtime.   traMADol  (ULTRAM ) 50 MG tablet Take 1-2 tablets (50-100 mg total) by mouth every 6 (six) hours as needed for moderate pain (pain score 4-6).   traZODone  (DESYREL ) 50 MG tablet TAKE 2 TABLETS BY MOUTH AT BEDTIME.   vitamin B-12 (CYANOCOBALAMIN ) 500 MCG tablet Take 500 mcg by mouth in the morning.   [DISCONTINUED] ferrous sulfate  (FERROUSUL) 325 (65 FE) MG tablet Take 1 tablet (325 mg total) by mouth 3 (three) times daily with meals for 14 days.   [DISCONTINUED] nortriptyline  (PAMELOR ) 25 MG capsule TAKE 3 CAPSULES BY MOUTH AT BEDTIME   No facility-administered encounter medications on file as of 08/30/2024.    History: Past Medical History:  Diagnosis Date   Arthritis    Complication of anesthesia    Pt reports that she was told someone had to breath for her She is not clear what this means   Difficult intubation    Diverticulitis    GERD (gastroesophageal reflux disease)    History of COVID-19 05/2020   Hypercholesteremia    Hypertension    Mild cognitive impairment    Peripheral neuropathy    Pre-diabetes    Restless leg syndrome    Past Surgical History:  Procedure Laterality Date   ABDOMINAL HYSTERECTOMY     Age 50   BACK SURGERY  2004   blephroplasty      bilateral   BREAST BIOPSY Left    COLONOSCOPY WITH PROPOFOL  N/A 11/13/2020   Procedure: COLONOSCOPY WITH PROPOFOL ;  Surgeon: Rollin Dover, MD;  Location: [facility];  Service: Endoscopy;  Laterality: N/A;   ESOPHAGOGASTRODUODENOSCOPY (EGD) WITH PROPOFOL  N/A 11/13/2020   Procedure: ESOPHAGOGASTRODUODENOSCOPY (EGD) WITH PROPOFOL ;  Surgeon: Rollin Dover, MD;  Location: [facility];  Service: Endoscopy;  Laterality: N/A;   EYE SURGERY     bilateral cateract    POLYPECTOMY  11/13/2020   Procedure: POLYPECTOMY;  Surgeon: Rollin Dover, MD;  Location: [facility];  Service: Endoscopy;;   REVERSE SHOULDER ARTHROPLASTY Left 04/07/2022   Procedure: REVERSE SHOULDER ARTHROPLASTY;  Surgeon: Dozier Soulier, MD;  Location: [facility];  Service: Orthopedics;  Laterality: Left;   REVERSE SHOULDER ARTHROPLASTY Right 07/06/2023   Procedure: REVERSE SHOULDER ARTHROPLASTY;  Surgeon: Dozier Soulier, MD;  Location: [facility];  Service: Orthopedics;  Laterality: Right;   TONSILLECTOMY     As a child   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   Procedure: TOTAL KNEE ARTHROPLASTY;  Surgeon: Ernie Cough, MD;  Location: [facility];  Service: Orthopedics;  Laterality: Left;  70 mins   TRANSFORAMINAL LUMBAR INTERBODY FUSION (TLIF) WITH PEDICLE SCREW FIXATION 1  LEVEL Right 01/12/2023   Procedure: RIGHT-SIDED TRSANSFORAMINAL LUMBAR INTERBODY FUSION LUMBAR 4 - LUMBAR 5  WITH INSTRUMENTATION AND ALLOGRAFT;  Surgeon: Beuford Anes, MD;  Location: [facility];  Service: Orthopedics;  Laterality: Right;   Family History  Problem Relation Age of Onset   Breast cancer Sister    Other Mother        passed away at age 18   Heart attack Father        passed away at age 42   Social History   Occupational History   Occupation: Retired  Tobacco Use   Smoking status: Never   Smokeless tobacco: Never  Vaping Use   Vaping status: Never Used  Substance and Sexual Activity   Alcohol  use: No   Drug use: No   Sexual activity: Not  Currently   Tobacco Counseling Counseling given: No  SDOH Screenings   Food Insecurity: No Food Insecurity (08/30/2024)  Housing: Unknown (08/30/2024)  Transportation Needs: No Transportation Needs (08/30/2024)  Utilities: Not At Risk (08/30/2024)  Alcohol  Screen: Low Risk (08/14/2023)  Depression (PHQ2-9): Low Risk (08/30/2024)  Financial Resource Strain: Low Risk (08/14/2023)  Physical Activity: Inactive (08/30/2024)  Social Connections: Moderately Integrated (08/30/2024)  Stress: No Stress Concern Present (08/30/2024)  Tobacco Use: Low Risk (08/30/2024)  Health Literacy: Adequate Health Literacy (08/30/2024)   See flowsheets for full screening details  Depression Screen PHQ 2 & 9 Depression Scale- Over the past 2 weeks, how often have you been bothered by any of the following problems? Little interest or pleasure in doing things: 0 Feeling down, depressed, or hopeless (PHQ Adolescent also includes...irritable): 0 PHQ-2 Total Score: 0 Trouble falling or staying asleep, or sleeping too much: 0 Feeling tired or having little energy: 1 Poor appetite or overeating (PHQ Adolescent also includes...weight loss): 0 Feeling bad about yourself - or that you are a failure or have let yourself or your family down: 0 Trouble concentrating on things, such as reading the newspaper or watching television (PHQ Adolescent also includes...like school work): 1 Moving or speaking so slowly that other people could have noticed. Or the opposite - being so fidgety or restless that you have been moving around a lot more than usual: 1 Thoughts that you would be better off dead, or of hurting yourself in some way: 0 PHQ-9 Total Score: 3     Goals Addressed               This Visit's Progress     Remain active (pt-stated)               Objective:    Today's Vitals   08/30/24 0910  BP: 122/62  Pulse: 82  Temp: 98.1 F (36.7 C)  TempSrc: Oral  SpO2: 93%  Weight: 161 lb 3.2 oz (73.1 kg)  Height: 5' 2  (1.575 m)   Body mass index is 29.48 kg/m.  Hearing/Vision screen Hearing Screening - Comments:: Denies hearing difficulties   Vision Screening - Comments:: Wears rx glasses - up to date with routine eye exams with  Dr Cleotilde Immunizations and Health Maintenance Health Maintenance  Topic Date Due   Zoster Vaccines- Shingrix (1 of 2) Never done   DTaP/Tdap/Td (2 - Td or Tdap) 09/28/2020   COVID-19 Vaccine (1 - 2025-26 season) Never done   Influenza Vaccine  11/26/2024 (Originally 03/29/2024)   Medicare Annual Wellness (AWV)  08/30/2025   Pneumococcal Vaccine: 50+ Years  Completed   Bone Density Scan  Completed   Meningococcal B  Vaccine  Aged Out        Assessment/Plan:  This is a routine wellness examination for Estrellita.  Patient Care Team: Theophilus Andrews, Tully GRADE, MD as PCP - General (Internal Medicine)  I have personally reviewed and noted the following in the patients chart:   Medical and social history Use of alcohol , tobacco or illicit drugs  Current medications and supplements including opioid prescriptions. Functional ability and status Nutritional status Physical activity Advanced directives List of other physicians Hospitalizations, surgeries, and ER visits in previous 12 months Vitals Screenings to include cognitive, depression, and falls Referrals and appointments  No orders of the defined types were placed in this encounter.  In addition, I have reviewed and discussed with patient certain preventive protocols, quality metrics, and best practice recommendations. A written personalized care plan for preventive services as well as general preventive health recommendations were provided to patient.   Rojelio LELON Blush, LPN   04/04/7972   Return in 1 year (on 09/02/2025).  After Visit Summary: (In Person-Declined) Patient declined AVS at this time.  Nurse Notes: No voiced or noted concerns at this time "

## 2024-08-30 NOTE — Patient Instructions (Addendum)
 Ms. Murthy,  Thank you for taking the time for your Medicare Wellness Visit. I appreciate your continued commitment to your health goals. Please review the care plan we discussed, and feel free to reach out if I can assist you further.  Please note that Annual Wellness Visits do not include a physical exam. Some assessments may be limited, especially if the visit was conducted virtually. If needed, we may recommend an in-person follow-up with your provider.  Ongoing Care Seeing your primary care provider every 3 to 6 months helps us  monitor your health and provide consistent, personalized care.   Referrals If a referral was made during today's visit and you haven't received any updates within two weeks, please contact the referred provider directly to check on the status.  Recommended Screenings:  Health Maintenance  Topic Date Due   Zoster (Shingles) Vaccine (1 of 2) Never done   DTaP/Tdap/Td vaccine (2 - Td or Tdap) 09/28/2020   COVID-19 Vaccine (1 - 2025-26 season) Never done   Flu Shot  11/26/2024*   Medicare Annual Wellness Visit  08/30/2025   Pneumococcal Vaccine for age over 52  Completed   Osteoporosis screening with Bone Density Scan  Completed   Meningitis B Vaccine  Aged Out  *Topic was postponed. The date shown is not the original due date.       08/30/2024    9:31 AM  Advanced Directives  Does Patient Have a Medical Advance Directive? Yes  Type of Estate Agent of [place];Living will  Does patient want to make changes to medical advance directive? No - Patient declined  Copy of Healthcare Power of Attorney in Chart? No - copy requested    Vision: Annual vision screenings are recommended for early detection of glaucoma, cataracts, and diabetic retinopathy. These exams can also reveal signs of chronic conditions such as diabetes and high blood pressure.  Dental: Annual dental screenings help detect early signs of oral cancer, gum disease, and other  conditions linked to overall health, including heart disease and diabetes.  Please see the attached documents for additional preventive care recommendations.

## 2024-09-02 ENCOUNTER — Ambulatory Visit

## 2024-09-10 ENCOUNTER — Other Ambulatory Visit: Payer: Self-pay | Admitting: *Deleted

## 2024-09-10 DIAGNOSIS — G2581 Restless legs syndrome: Secondary | ICD-10-CM

## 2024-09-10 DIAGNOSIS — K219 Gastro-esophageal reflux disease without esophagitis: Secondary | ICD-10-CM

## 2024-09-10 MED ORDER — OMEPRAZOLE 40 MG PO CPDR: 40.0000 mg | DELAYED_RELEASE_CAPSULE | Freq: Every morning | ORAL | 1 refills | Status: AC

## 2024-09-10 NOTE — Telephone Encounter (Addendum)
 [facility]   Refill of  gabapentin  (NEURONTIN ) 300 MG capsule Meloxicam  15 mg Trazodone  50 mg Pramipexole  0.25 mg

## 2024-09-11 MED ORDER — TRAZODONE HCL 50 MG PO TABS: 100.0000 mg | ORAL_TABLET | Freq: Every day | ORAL | 1 refills | Status: AC

## 2024-09-11 MED ORDER — GABAPENTIN 300 MG PO CAPS: 300.0000 mg | ORAL_CAPSULE | Freq: Four times a day (QID) | ORAL | 0 refills | Status: AC

## 2024-09-11 MED ORDER — MELOXICAM 15 MG PO TABS: 15.0000 mg | ORAL_TABLET | Freq: Every day | ORAL | 3 refills | Status: AC

## 2024-09-11 MED ORDER — PRAMIPEXOLE DIHYDROCHLORIDE 0.25 MG PO TABS: 0.2500 mg | ORAL_TABLET | Freq: Every day | ORAL | 1 refills | Status: AC

## 2024-09-18 ENCOUNTER — Other Ambulatory Visit: Payer: Self-pay | Admitting: *Deleted

## 2024-09-18 MED ORDER — SIMVASTATIN 20 MG PO TABS: 20.0000 mg | ORAL_TABLET | Freq: Every day | ORAL | 0 refills | Status: DC

## 2024-09-18 NOTE — Telephone Encounter (Signed)
 Copied from CRM #[id number]. Topic: Clinical - Medication Refill >> Sep 18, 2024  4:01 PM Burnard DEL wrote: Medication: simvastatin  (ZOCOR ) 20 MG tablet  Has the patient contacted their pharmacy? No (Agent: If no, request that the patient contact the pharmacy for the refill. If patient does not wish to contact the pharmacy document the reason why and proceed with request.) (Agent: If yes, when and what did the pharmacy advise?)  [id number] - [place], [place] - [address]  Phone: [phone] Fax: [phone]  Refill sent.      Is this the correct pharmacy for this prescription? Yes If no, delete pharmacy and type the correct one.   Has the prescription been filled recently? No  Is the patient out of the medication? Yes  Has the patient been seen for an appointment in the last year OR does the patient have an upcoming appointment? Yes  Can we respond through MyChart? No  Agent: Please be advised that Rx refills may take up to 3 business days. We ask that you follow-up with your pharmacy.

## 2024-09-19 ENCOUNTER — Telehealth: Payer: Self-pay

## 2024-09-19 NOTE — Telephone Encounter (Signed)
 Copied from CRM #[id number]. Topic: Clinical - Medication Refill >> Sep 19, 2024  1:27 PM Burnard DEL wrote: Medication: amLODipine  (NORVASC ) 2.5 MG tablet  Has the patient contacted their pharmacy? Yes (Agent: If no, request that the patient contact the pharmacy for the refill. If patient does not wish to contact the pharmacy document the reason why and proceed with request.) (Agent: If yes, when and what did the pharmacy advise?)  This is the patient's preferred pharmacy:    [facility] - [place], [place] - [address] [place] [address] Phone: [phone] Fax: [phone]    Is this the correct pharmacy for this prescription? Yes If no, delete pharmacy and type the correct one.   Has the prescription been filled recently? No  Is the patient out of the medication? Yes  Has the patient been seen for an appointment in the last year OR does the patient have an upcoming appointment? Yes  Can we respond through MyChart? Yes  Agent: Please be advised that Rx refills may take up to 3 business days. We ask that you follow-up with your pharmacy.

## 2024-09-21 ENCOUNTER — Encounter: Payer: Self-pay | Admitting: Internal Medicine

## 2024-09-24 MED ORDER — SIMVASTATIN 20 MG PO TABS: 20.0000 mg | ORAL_TABLET | Freq: Every day | ORAL | 0 refills | Status: AC

## 2024-09-24 MED ORDER — AMLODIPINE BESYLATE 2.5 MG PO TABS: 2.5000 mg | ORAL_TABLET | Freq: Every morning | ORAL | 1 refills | Status: AC

## 2024-09-24 NOTE — Addendum Note (Signed)
 Addended by: KATHRYNE MILLMAN B on: 09/24/2024 01:57 PM   Modules accepted: Orders

## 2025-09-02 ENCOUNTER — Ambulatory Visit
